# Patient Record
Sex: Female | Born: 1975
Health system: Southern US, Community
[De-identification: ages and names within clinical notes are randomized; demographics above are authoritative.]

## PROBLEM LIST (undated history)

## (undated) DIAGNOSIS — K259 Gastric ulcer, unspecified as acute or chronic, without hemorrhage or perforation: Secondary | ICD-10-CM

## (undated) DIAGNOSIS — R740 Nonspecific elevation of levels of transaminase and lactic acid dehydrogenase [LDH]: Secondary | ICD-10-CM

## (undated) DIAGNOSIS — F411 Generalized anxiety disorder: Secondary | ICD-10-CM

## (undated) DIAGNOSIS — F17200 Nicotine dependence, unspecified, uncomplicated: Secondary | ICD-10-CM

## (undated) DIAGNOSIS — M419 Scoliosis, unspecified: Secondary | ICD-10-CM

## (undated) DIAGNOSIS — E042 Nontoxic multinodular goiter: Secondary | ICD-10-CM

## (undated) DIAGNOSIS — R51 Headache: Secondary | ICD-10-CM

## (undated) DIAGNOSIS — N926 Irregular menstruation, unspecified: Secondary | ICD-10-CM

## (undated) DIAGNOSIS — K219 Gastro-esophageal reflux disease without esophagitis: Secondary | ICD-10-CM

## (undated) DIAGNOSIS — E079 Disorder of thyroid, unspecified: Secondary | ICD-10-CM

## (undated) DIAGNOSIS — G43909 Migraine, unspecified, not intractable, without status migrainosus: Secondary | ICD-10-CM

## (undated) HISTORY — DX: Nonspecific elevation of levels of transaminase and lactic acid dehydrogenase (ldh): R74.0

## (undated) HISTORY — DX: Migraine, unspecified, not intractable, without status migrainosus: G43.909

## (undated) HISTORY — DX: Disorder of thyroid, unspecified: E07.9

## (undated) HISTORY — DX: Nontoxic multinodular goiter: E04.2

## (undated) HISTORY — PX: UPPER GASTROINTESTINAL ENDOSCOPY: SHX188

## (undated) HISTORY — PX: TYMPANOPLASTY: SHX33

## (undated) HISTORY — DX: Irregular menstruation, unspecified: N92.6

## (undated) HISTORY — DX: Nicotine dependence, unspecified, uncomplicated: F17.200

## (undated) HISTORY — DX: Generalized anxiety disorder: F41.1

## (undated) HISTORY — DX: Scoliosis, unspecified: M41.9

## (undated) HISTORY — PX: TONSILLECTOMY: SUR1361

## (undated) HISTORY — PX: TYMPANOSTOMY TUBE PLACEMENT: SHX32

## (undated) HISTORY — DX: Gastro-esophageal reflux disease without esophagitis: K21.9

## (undated) HISTORY — DX: Headache: R51

## (undated) HISTORY — DX: Gastric ulcer, unspecified as acute or chronic, without hemorrhage or perforation: K25.9

---

## 2000-09-03 ENCOUNTER — Other Ambulatory Visit: Admission: RE | Admit: 2000-09-03 | Discharge: 2000-09-03 | Payer: Self-pay | Admitting: Internal Medicine

## 2002-06-03 ENCOUNTER — Other Ambulatory Visit: Admission: RE | Admit: 2002-06-03 | Discharge: 2002-06-03 | Payer: Self-pay | Admitting: Internal Medicine

## 2006-05-01 ENCOUNTER — Other Ambulatory Visit: Admission: RE | Admit: 2006-05-01 | Discharge: 2006-05-01 | Payer: Self-pay | Admitting: Internal Medicine

## 2006-05-01 ENCOUNTER — Encounter: Payer: Self-pay | Admitting: Internal Medicine

## 2006-05-01 ENCOUNTER — Ambulatory Visit: Payer: Self-pay | Admitting: Internal Medicine

## 2006-05-01 LAB — CONVERTED CEMR LAB

## 2007-06-17 ENCOUNTER — Encounter: Payer: Self-pay | Admitting: Internal Medicine

## 2007-06-17 DIAGNOSIS — R51 Headache: Secondary | ICD-10-CM | POA: Insufficient documentation

## 2007-06-17 DIAGNOSIS — R519 Headache, unspecified: Secondary | ICD-10-CM | POA: Insufficient documentation

## 2007-06-17 DIAGNOSIS — N926 Irregular menstruation, unspecified: Secondary | ICD-10-CM | POA: Insufficient documentation

## 2007-06-17 HISTORY — DX: Irregular menstruation, unspecified: N92.6

## 2007-06-17 HISTORY — DX: Headache: R51

## 2007-07-23 ENCOUNTER — Other Ambulatory Visit: Admission: RE | Admit: 2007-07-23 | Discharge: 2007-07-23 | Payer: Self-pay | Admitting: Internal Medicine

## 2007-07-23 ENCOUNTER — Encounter: Payer: Self-pay | Admitting: Internal Medicine

## 2007-07-23 ENCOUNTER — Ambulatory Visit: Payer: Self-pay | Admitting: Internal Medicine

## 2007-07-26 LAB — CONVERTED CEMR LAB
AST: 18 units/L (ref 0–37)
Albumin: 4.4 g/dL (ref 3.5–5.2)
Basophils Absolute: 0 10*3/uL (ref 0.0–0.1)
Bilirubin, Direct: 0.1 mg/dL (ref 0.0–0.3)
CO2: 31 meq/L (ref 19–32)
Chloride: 105 meq/L (ref 96–112)
Eosinophils Relative: 1.4 % (ref 0.0–5.0)
GFR calc non Af Amer: 103 mL/min
Glucose, Bld: 69 mg/dL — ABNORMAL LOW (ref 70–99)
HCT: 38.7 % (ref 36.0–46.0)
LDL Cholesterol: 102 mg/dL — ABNORMAL HIGH (ref 0–99)
MCV: 91.7 fL (ref 78.0–100.0)
Neutro Abs: 2.3 10*3/uL (ref 1.4–7.7)
RBC: 4.22 M/uL (ref 3.87–5.11)
RDW: 12.4 % (ref 11.5–14.6)
Sodium: 141 meq/L (ref 135–145)
Total Bilirubin: 0.9 mg/dL (ref 0.3–1.2)
Total CHOL/HDL Ratio: 2.5
Total Protein: 7 g/dL (ref 6.0–8.3)
VLDL: 12 mg/dL (ref 0–40)

## 2008-01-10 ENCOUNTER — Ambulatory Visit: Payer: Self-pay | Admitting: Internal Medicine

## 2008-01-10 DIAGNOSIS — F17200 Nicotine dependence, unspecified, uncomplicated: Secondary | ICD-10-CM

## 2008-01-10 HISTORY — DX: Nicotine dependence, unspecified, uncomplicated: F17.200

## 2008-12-04 ENCOUNTER — Ambulatory Visit: Payer: Self-pay | Admitting: Internal Medicine

## 2009-01-10 ENCOUNTER — Other Ambulatory Visit: Admission: RE | Admit: 2009-01-10 | Discharge: 2009-01-10 | Payer: Self-pay | Admitting: Internal Medicine

## 2009-01-10 ENCOUNTER — Encounter: Payer: Self-pay | Admitting: Internal Medicine

## 2009-01-10 ENCOUNTER — Ambulatory Visit: Payer: Self-pay | Admitting: Internal Medicine

## 2009-01-10 DIAGNOSIS — R7401 Elevation of levels of liver transaminase levels: Secondary | ICD-10-CM | POA: Insufficient documentation

## 2009-01-10 DIAGNOSIS — R740 Nonspecific elevation of levels of transaminase and lactic acid dehydrogenase [LDH]: Secondary | ICD-10-CM

## 2009-01-10 DIAGNOSIS — R7402 Elevation of levels of lactic acid dehydrogenase (LDH): Secondary | ICD-10-CM | POA: Insufficient documentation

## 2009-01-10 DIAGNOSIS — E079 Disorder of thyroid, unspecified: Secondary | ICD-10-CM

## 2009-01-10 HISTORY — DX: Elevation of levels of liver transaminase levels: R74.01

## 2009-01-10 HISTORY — DX: Disorder of thyroid, unspecified: E07.9

## 2009-01-11 LAB — CONVERTED CEMR LAB
AST: 17 units/L (ref 0–37)
T3, Free: 2.9 pg/mL (ref 2.3–4.2)

## 2009-02-08 ENCOUNTER — Encounter: Payer: Self-pay | Admitting: Internal Medicine

## 2009-02-21 ENCOUNTER — Telehealth: Payer: Self-pay | Admitting: Internal Medicine

## 2009-03-12 ENCOUNTER — Ambulatory Visit: Payer: Self-pay | Admitting: Endocrinology

## 2009-04-17 ENCOUNTER — Encounter: Admission: RE | Admit: 2009-04-17 | Discharge: 2009-04-17 | Payer: Self-pay | Admitting: Endocrinology

## 2009-04-18 DIAGNOSIS — E042 Nontoxic multinodular goiter: Secondary | ICD-10-CM

## 2009-04-18 HISTORY — DX: Nontoxic multinodular goiter: E04.2

## 2009-04-24 ENCOUNTER — Telehealth (INDEPENDENT_AMBULATORY_CARE_PROVIDER_SITE_OTHER): Payer: Self-pay | Admitting: *Deleted

## 2010-01-04 ENCOUNTER — Ambulatory Visit: Payer: Self-pay | Admitting: Endocrinology

## 2010-01-30 ENCOUNTER — Ambulatory Visit: Payer: Self-pay | Admitting: Internal Medicine

## 2010-01-30 ENCOUNTER — Other Ambulatory Visit: Admission: RE | Admit: 2010-01-30 | Discharge: 2010-01-30 | Payer: Self-pay | Admitting: Internal Medicine

## 2010-01-30 DIAGNOSIS — F411 Generalized anxiety disorder: Secondary | ICD-10-CM

## 2010-01-30 HISTORY — DX: Generalized anxiety disorder: F41.1

## 2010-01-30 LAB — CONVERTED CEMR LAB
Bilirubin Urine: NEGATIVE
Blood in Urine, dipstick: NEGATIVE
Glucose, Urine, Semiquant: NEGATIVE
Specific Gravity, Urine: >=1.03
WBC Urine, dipstick: NEGATIVE

## 2010-01-30 LAB — HM PAP SMEAR

## 2010-02-04 LAB — CONVERTED CEMR LAB
BUN: 8 mg/dL (ref 6–23)
Basophils Absolute: 0 10*3/uL (ref 0.0–0.1)
Bilirubin, Direct: 0.2 mg/dL (ref 0.0–0.3)
CO2: 28 meq/L (ref 19–32)
Calcium: 9.5 mg/dL (ref 8.4–10.5)
Chloride: 107 meq/L (ref 96–112)
Cholesterol: 169 mg/dL (ref 0–200)
Creatinine, Ser: 0.7 mg/dL (ref 0.4–1.2)
Eosinophils Absolute: 0 10*3/uL (ref 0.0–0.7)
Glucose, Bld: 73 mg/dL (ref 70–99)
HCT: 39.4 % (ref 36.0–46.0)
LDL Cholesterol: 91 mg/dL (ref 0–99)
Lymphocytes Relative: 34.9 % (ref 12.0–46.0)
MCHC: 33.6 g/dL (ref 30.0–36.0)
MCV: 97 fL (ref 78.0–100.0)
Monocytes Absolute: 0.4 10*3/uL (ref 0.1–1.0)
Monocytes Relative: 9.3 % (ref 3.0–12.0)
RBC: 4.06 M/uL (ref 3.87–5.11)
RDW: 12.2 % (ref 11.5–14.6)
T3, Free: 2.7 pg/mL (ref 2.3–4.2)
TSH: 0.42 microintl units/mL (ref 0.35–5.50)
Total Protein: 7.3 g/dL (ref 6.0–8.3)
Triglycerides: 23 mg/dL (ref 0.0–149.0)
WBC: 4.5 10*3/uL (ref 4.5–10.5)

## 2010-03-04 ENCOUNTER — Encounter: Payer: Self-pay | Admitting: Internal Medicine

## 2010-05-01 ENCOUNTER — Ambulatory Visit: Payer: Self-pay | Admitting: Endocrinology

## 2010-05-07 ENCOUNTER — Encounter: Admission: RE | Admit: 2010-05-07 | Discharge: 2010-05-07 | Payer: Self-pay | Admitting: Endocrinology

## 2011-01-19 ENCOUNTER — Encounter: Payer: Self-pay | Admitting: Obstetrics and Gynecology

## 2011-01-25 ENCOUNTER — Encounter: Payer: Self-pay | Admitting: Internal Medicine

## 2011-01-26 LAB — CONVERTED CEMR LAB
ALT: 45 units/L — ABNORMAL HIGH (ref 0–35)
AST: 31 units/L (ref 0–37)
Alkaline Phosphatase: 64 units/L (ref 39–117)
CO2: 29 meq/L (ref 19–32)
Cholesterol: 175 mg/dL (ref 0–200)
Creatinine, Ser: 0.6 mg/dL (ref 0.4–1.2)
Eosinophils Relative: 2.2 % (ref 0.0–5.0)
GFR calc Af Amer: 148 mL/min
GFR calc non Af Amer: 122 mL/min
HCT: 38.8 % (ref 36.0–46.0)
HDL: 66.8 mg/dL (ref >39.0)
Lymphocytes Relative: 41.8 % (ref 12.0–46.0)
MCHC: 34.8 g/dL (ref 30.0–36.0)
MCV: 92.1 fL (ref 78.0–100.0)
Monocytes Absolute: 0.3 10*3/uL (ref 0.1–1.0)
Neutro Abs: 1.8 10*3/uL (ref 1.4–7.7)
Potassium: 4.1 meq/L (ref 3.5–5.1)
Sodium: 139 meq/L (ref 135–145)
TSH: 0.97 microintl units/mL (ref 0.35–5.50)
Total CHOL/HDL Ratio: 2.6
Total Protein: 7.4 g/dL (ref 6.0–8.3)
Triglycerides: 56 mg/dL (ref 0–149)
WBC: 3.8 10*3/uL — ABNORMAL LOW (ref 4.5–10.5)

## 2011-01-28 NOTE — Letter (Signed)
Summary: [facility]  [facility]   Imported By: Maryln Gottron 03/13/2010 12:49:16  _____________________________________________________________________  External Attachment:    Type:   Image     Comment:   External Document

## 2011-01-28 NOTE — Assessment & Plan Note (Signed)
Summary: CPX/PAP/PT WILL COME IN FASTING/NJR   Vital Signs:  Patient profile:   35 year old female Menstrual status:  irregular LMP:     01/02/2010 Height:      64.5 inches Weight:      136 pounds BMI:     23.07 Pulse rate:   66 / minute BP sitting:   100 / 60  (right arm) Cuff size:   regular  Vitals Entered By: Romualdo Bolk, CMA (AAMA) (January 30, 2010 10:22 AM) CC: CPX with pap- Pt is having her periods every 15-20 days, Pt would like MRI/CT of head to make sure nothing is wrong of ha since she is having terrible ha's.  Pt would also like a full body scan to make sure everything is okay with body. Pt is aware that Ins may not pay for this. Pt also needs something for nerves. She is still anxious and needs something to calm her down short term. She states that her emotions take over her at times.  LMP (date): 01/02/2010 LMP - Character: heavy Menarche (age onset years): 10   Menses interval (days): 15-20 Menstrual flow (days): 7-10 Menstrual Status irregular Enter LMP: 01/02/2010 Last PAP Result NEGATIVE FOR INTRAEPITHELIAL LESIONS OR MALIGNANCY.   History of Present Illness: Tell patient that come in today for   for preventive visit and pap. She is now married . She has concerns about a number of ongoing problems and asks opinion. HAs: hasnt seen dr lewit in almost a year. topamax didnt help but did  decrease meds and takes excedrin at most 2 x per week. limiting meds.   Fertility: was supposed to get ? [facility]  but dindt do this yet because of family issues and life changes .  Anxiety:  now that other things are calm needs advice about htis.  had seen counseler and med at Dr Walker Shadow office.  over a year ago. sometimes has sleep issue with mind racing and worry.    Asks about imaging to r/o any problems such as head imaging etc .  Goiter  : followed by dr Everardo All. has follow up soon . no known change.   Preventive Screening-Counseling &  Management  Alcohol-Tobacco     Alcohol drinks/day: <1     Alcohol type: wine     Smoking Status: quit     Year Quit: 05-30-07 Caffeine-Diet-Exercise     Caffeine use/day: 2 small cups     Does Patient Exercise: no  Hep-HIV-STD-Contraception     05/30/2023 Exposure-Excessive: no  Safety-Violence-Falls     Seat Belt Use: yes     Smoke Detectors: yes  Current Medications (verified): 1)  None  Allergies (verified): No Known Drug Allergies  Past History:  Past medical, surgical, family and social histories (including risk factors) reviewed, and no changes noted (except as noted below).  Past Medical History: Headaches anxiety Irreg menses  infertility evaluation Goiter       LAST Mammogram: n/a Pap: 2007-05-30 Td: unsure Colonscopy: n/a EKG: 05-30-2003 Smoking: former  Consults None   Past Surgical History: Reviewed history from 01/10/2009 and no changes required. Denies surgical history PE tubes     Past History:  Care Management: [facility]rology: [facility]- it the past Gynecology: Cherly Hensen- in the past Endo: [facility]  Family History: Reviewed history from 03/12/2009 and no changes required. [place] Father: died from pneumonia  29-May-2005 , alcoholic.  no dm or ht     Mother:  05-30-03  surgical error cholec   doing better .   Siblings: older bro good., younger  sister no thyroid dz  Social History: HH of 2      pet dog some exercise.   Sleep  ok   38 hours or more per week.   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   Physical Exam General Appearance: well developed, well nourished, no acute distress Eyes: conjunctiva and lids normal, PERRLA, EOMI, WNL no lid lag  Ears, Nose, Mouth, Throat: TM clear, nares clear, oral exam WNL Neck: supple, no lymphadenopathy, , no JVD  thyroid palpable  non tender  Respiratory: clear to auscultation and percussion, respiratory effort normal Cardiovascular: regular rate and rhythm, S1-S2, no murmur, rub or gallop, no bruits, peripheral pulses normal and symmetric, no cyanosis, clubbing, edema or varicosities Chest: no scars, masses, tenderness; no asymmetry, skin changes, nipple discharge   Gastrointestinal: soft, non-tender; no hepatosplenomegaly, masses; active bowel sounds all quadrants,   Genitourinary: no vaginal discharge, lesions; no masses or tenderness PAP done  Lymphatic: no cervical, axillary or inguinal adenopathy Musculoskeletal: gait normal, muscle tone and strength WNL, no joint swelling, effusions, discoloration, crepitus  Skin: clear, good turgor, color WNL, no rashes, lesions, or ulcerations Neurologic: normal mental status, normal reflexes, normal strength, sensation, and motion Psychiatric: alert; oriented to person, place and time Other Exam:     Impression & Recommendations:  Problem # 1:  HEALTH MAINTENANCE EXAM, ADULT (ICD-V70.0) disc healthy lifestyle .  MRI testing for most   things not helpful for screening. rec  see HA eval for advisability of  imaging of head.  Orders: TLB-Lipid Panel (80061-LIPID) TLB-BMP (Basic Metabolic Panel-BMET) (80048-METABOL) TLB-CBC Platelet - w/Differential (85025-CBCD) TLB-Hepatic/Liver Function Pnl (80076-HEPATIC) TLB-TSH (Thyroid Stimulating Hormone) (84443-TSH) TLB-Prolactin (84146-PROL) T-HIV Antibody  (Reflex) [id number]) T-RPR (Syphilis) (09811-91478)  Problem # 2:  ROUTINE GYNECOLOGICAL EXAM  (ICD-V72.31) PAP done  Orders: T-HIV Antibody  (Reflex) ([id number]) T-RPR (Syphilis) ([id number]) Pap Smear, Thin Prep ( Collection of) (M8413)  Problem # 3:  GOITER, MULTINODULAR (ICD-241.1)  Orders: TLB-T4 (Thyrox), Free (84439-FT4R) TLB-T3, Free (Triiodothyronine) (84481-T3FREE)  Problem # 4:  IRREGULAR MENSTRUATION (ICD-626.4) Assessment: Comment Only  Problem # 5:  HEADACHE (ICD-784.0) calendar ha and reviewed    back to dr lewit for now   minimize meds Orders: TLB-Prolactin (84146-PROL)  Problem # 6:  anxiety  disc    rec counsleing if needed.   Patient Instructions: 1)  congrats on tobacco free. 2)  Calendar HA and meds again and get back with Dr Clarisse Gouge .   3)  See Dr Rod Mae all about the anxiety issues again. 4)  Continue to exercise and eat healthy.  5)  Kee p appt with Dr  Everardo All. 6)  You will be informed of lab results when available.  in addition to PV counseling   .    additional counseling about her HA , anxiety, and goiter .    15 minutes .   Laboratory Results   Urine Tests    Routine Urinalysis   Color: yellow Appearance: Clear Glucose: negative   (  Normal Range: Negative) Bilirubin: negative   (Normal Range: Negative) Ketone: trace (5)   (Normal Range: Negative) Spec. Gravity: >=1.030   (Normal Range: 1.003-1.035) Blood: negative   (Normal Range: Negative) pH: 5.5   (Normal Range: 5.0-8.0) Protein: trace   (Normal Range: Negative) Urobilinogen: 0.2   (Normal Range: 0-1) Nitrite: negative   (Normal Range: Negative) Leukocyte Esterace: negative   (Normal Range: Negative)    Comments: Rita Ohara  January 30, 2010 2:37 PM

## 2011-01-28 NOTE — Assessment & Plan Note (Signed)
Summary: follow up-lb   Vital Signs:  Patient profile:   35 year old female Menstrual status:  irregular Height:      64 inches Weight:      136.50 pounds O2 Sat:      99 % on Room air Temp:     98.1 degrees F oral Pulse rate:   78 / minute BP sitting:   110 / 70  (left arm) Cuff size:   regular  Vitals Entered By: Margaret Pyle, CMA (May 01, 2010 10:44 AM)  O2 Flow:  Room air CC: Follow up/DBD   Referring Provider:  Dr. Fabian Sharp Primary Provider:  Madelin Headings MD  CC:  Follow up/DBD.  History of Present Illness: pt says she does not notice the goiter, except that the right side of the anterior neck is slightly larger than the left.  neck is painless.  Current Medications (verified): 1)  Hydrocodone .... As Needed For Migraine Headache  Allergies (verified): No Known Drug Allergies  Past History:  Past Medical History: Last updated: 01/30/2010 Headaches anxiety Irreg menses  infertility evaluation Goiter       LAST Mammogram: n/a Pap: 2008 Td: unsure Colonscopy: n/a EKG: 2004 Smoking: former  Consults None   Review of Systems       she has lost a few lbs, due to recent gi illness.  Physical Exam  General:  normal appearance.   Neck:  thyroid is 2x normal size.  no discrete nodule, but right lobe is larger than left Additional Exam:  test results are reviewed: tsh continues to be low-normal   Impression & Recommendations:  Problem # 1:  GOITER, MULTINODULAR (ICD-241.1)  FT4: 0.6 (01/30/2010)   TSH: 0.42 (01/30/2010)     Medications Added to Medication List This Visit: 1)  Hydrocodone  .... As needed for migraine headache  Other Orders: Est. Patient Level III (60454) Radiology Referral (Radiology)  Patient Instructions: 1)  cc dr cousins and le[facility]itt. 2)  recheck thyroid ultrasound.  you will be called with a day and time for an appointment. 3)  return 1 year. 4)  most of the time, a "lumpy thyroid" will eventually become  overactive.  this is usually a slow process, happening over the span of many years.

## 2011-02-05 ENCOUNTER — Other Ambulatory Visit (INDEPENDENT_AMBULATORY_CARE_PROVIDER_SITE_OTHER): Payer: BC Managed Care – PPO | Admitting: Internal Medicine

## 2011-02-05 DIAGNOSIS — Z0000 Encounter for general adult medical examination without abnormal findings: Secondary | ICD-10-CM

## 2011-02-05 LAB — CBC WITH DIFFERENTIAL/PLATELET
Basophils Absolute: 0 10*3/uL (ref 0.0–0.1)
Basophils Relative: 0.4 % (ref 0.0–3.0)
Eosinophils Absolute: 0.1 10*3/uL (ref 0.0–0.7)
HCT: 39.1 % (ref 36.0–46.0)
Lymphocytes Relative: 37.6 % (ref 12.0–46.0)
MCV: 93.6 fl (ref 78.0–100.0)
Monocytes Absolute: 0.5 10*3/uL (ref 0.1–1.0)
Monocytes Relative: 10.9 % (ref 3.0–12.0)
Neutrophils Relative %: 49.1 % (ref 43.0–77.0)
RDW: 13.5 % (ref 11.5–14.6)
WBC: 4.4 10*3/uL — ABNORMAL LOW (ref 4.5–10.5)

## 2011-02-05 LAB — LIPID PANEL
HDL: 71.8 mg/dL (ref >39.00)
LDL Cholesterol: 103 mg/dL — ABNORMAL HIGH (ref 0–99)

## 2011-02-05 LAB — BASIC METABOLIC PANEL
BUN: 8 mg/dL (ref 6–23)
CO2: 29 mEq/L (ref 19–32)
Calcium: 9.3 mg/dL (ref 8.4–10.5)
Chloride: 104 mEq/L (ref 96–112)
Creatinine, Ser: 0.7 mg/dL (ref 0.4–1.2)
Glucose, Bld: 73 mg/dL (ref 70–99)
Sodium: 138 mEq/L (ref 135–145)

## 2011-02-05 LAB — HEPATIC FUNCTION PANEL
Albumin: 4.5 g/dL (ref 3.5–5.2)
Bilirubin, Direct: 0.1 mg/dL (ref 0.0–0.3)

## 2011-02-12 ENCOUNTER — Ambulatory Visit (INDEPENDENT_AMBULATORY_CARE_PROVIDER_SITE_OTHER): Payer: BC Managed Care – PPO | Admitting: Internal Medicine

## 2011-02-12 ENCOUNTER — Encounter: Payer: Self-pay | Admitting: Internal Medicine

## 2011-02-12 ENCOUNTER — Other Ambulatory Visit (HOSPITAL_COMMUNITY)
Admission: RE | Admit: 2011-02-12 | Discharge: 2011-02-12 | Disposition: A | Discharge status: Home / Self Care | Payer: BC Managed Care – PPO | Source: Ambulatory Visit | Attending: Internal Medicine | Admitting: Internal Medicine

## 2011-02-12 DIAGNOSIS — Z0000 Encounter for general adult medical examination without abnormal findings: Secondary | ICD-10-CM | POA: Insufficient documentation

## 2011-02-12 DIAGNOSIS — R51 Headache: Secondary | ICD-10-CM

## 2011-02-12 DIAGNOSIS — Z01419 Encounter for gynecological examination (general) (routine) without abnormal findings: Secondary | ICD-10-CM | POA: Insufficient documentation

## 2011-02-12 DIAGNOSIS — E042 Nontoxic multinodular goiter: Secondary | ICD-10-CM

## 2011-02-12 DIAGNOSIS — Z124 Encounter for screening for malignant neoplasm of cervix: Secondary | ICD-10-CM | POA: Insufficient documentation

## 2011-02-12 DIAGNOSIS — G4700 Insomnia, unspecified: Secondary | ICD-10-CM | POA: Insufficient documentation

## 2011-02-12 MED ORDER — ZOLPIDEM TARTRATE 10 MG PO TABS: 10.0000 mg | ORAL_TABLET | Freq: Every evening | ORAL | Status: AC | PRN

## 2011-02-13 NOTE — Progress Notes (Signed)
  Subjective:    Patient ID: Crystal Mcintyre, female    DOB: 1976-06-14, 35 y.o.   MRN: [id number]  HPI  this patient comes in for a preventative exam and gynecology logic exam today. Since her last visit she has had no major changes in her health and is actually doing much better. There is less stress at home although work is still stressful. She continues to remain tobacco free. She still has her goiter but is not on any medication for this.   She sees Dr. Peggye Form for her headaches and they are improved with decreased stress. She avoids alcohol and only has caffeine in the morning. She still has problems sleeping and asks about a sleep aid. She has tried Tylenol PM with minimal success. Her schedule is erratic with work during the day and school sometimes late into the night.  she only takes will plan and hydrocodone as needed for her headaches which is not very frequent.  He is planning to undergo hCG retrieval infertility treatment soon. Her gynecologist is Dr. Cherly Hensen.   Review of Systems  negative for vision and hearing problem chest pain shortness of breath breast lumps GI GU problems except as above. Headaches as above. No bleeding bruising new joint swelling.  Rest of ros non contributory.    Objective:   Physical Exam Physical Exam: Vital signs reviewed WUJ:WJXB is a well-developed well-nourished alert cooperative  white female who appears her stated age in no acute distress.  HEENT: normocephalic  traumatic , Eyes: PERRL EOM's full, conjunctiva clear, Nares: paten,t no deformity discharge or tenderness., Ears: no deformity EAC's clear TMs with normal landmarks. Mouth: clear OP, no lesions, edema.  Moist mucous membranes. Dentition in adequate repair. NECK: supple without masses,  or bruits. Thyroid easily palpable and non tender  CHEST/PULM:  Clear to auscultation and percussion breath sounds equal no wheeze , rales or rhonchi. No chest wall deformities or tenderness.  Breast exam  without masses  Of discharge . Axilla is clear  CV: PMI is nondisplaced, S1 S2 no gallops, murmurs, rubs. Peripheral pulses are full without delay.No JVD .  ABDOMEN: Bowel sounds normal nontender  No guard or rebound, no hepato splenomegal no CVA tenderness.  No hernia. Extremtities:  No clubbing cyanosis or edema, no acute joint swelling or redness no focal atrophy NEURO:  Oriented x3, cranial nerves 3-12 appear to be intact, no obvious focal weakness,gait within normal limits no abnormal reflexes or asymmetrical SKIN: No acute rashes normal turgor, color, no bruising or petechiae. PSYCH: Oriented, good eye contact, no obvious depression anxiety, cognition and judgment appear normal.  LN no adenopathy  Cervical axillary and  Inguinal  Pelvic: NL ext GU, labia clear without lesions or rash . Vagina no lesions .Cervix: clear  UTERUS: Neg CMT Adnexa:  clear no masses . PAP done       Assessment & Plan:   preventive visit Pap smear done. Labs reviewed. Up to date. Counseled regarding healthy nutrition, exercise, sleep, injury prevention, calcium vit d and healthy weight .   Sleep disturbance   It has been problematic and she has paid attention to his sleep hygiene in stress reduction. It is reasonable to try another sleep aid with discussion  Risk-benefit because it is habit forming. We'll give her a prescription today and observe.  headaches under care by Dr.  Clarisse Gouge   Stable at present.  multinodular goiter  Sees. Dr. Everardo All reviewed last ultrasound.  Fertility treatment.

## 2011-02-13 NOTE — Assessment & Plan Note (Signed)
Sleep disturbance   It has been problematic and she has paid attention to his sleep hygiene in stress reduction. It is reasonable to try another sleep aid with discussion  Risk-benefit because it is habit forming. We'll give her a prescription today and observe.

## 2011-05-13 NOTE — Assessment & Plan Note (Signed)
[facility]                            [facility] OFFICE NOTE   BRAZIL, VOYTKO                     MRN:          [id number]  DATE:07/23/2007                            DOB:          06/09/1976    CHIEF COMPLAINT:  Here for a checkup.   The patient has had a past history of stress with significant family  illnesses.  She has seen Dr. Loralie Champagne office in this regard, and had  been on a number of medications for this.  However, is only on Lunesta  HS at present.  She is considering conception, and has questions about  this.  Her periods are regular, although she does have occasional hot  flashes.   PAST MEDICAL HISTORY:  See chart.  Headaches and irregular periods.   SOCIAL HISTORY:  She is married, does smoke tobacco, just when she comes  home from work; and occasional alcohol.  She tries to do regular  exercise.   FAMILY HISTORY:  No significant change.   OBJECTIVE:  Height 64.5, weight 140, pulse 78 and regular.  Blood  pressure 100/60.  She is a well developed, well nourished, healthy appearing African-  American female in no acute distress.  HEENT:  Normocephalic.  TMs are clear.  Eyes:  PERL.  Sclerae are not  icteric.  OP is clear.  Teeth are in good repair.  NECK:  Supple without masses, thyromegaly or bruit.  CHEST:  CTA.  BS equal.  Cardiac S1 and S2, no gallops or murmur.  Peripheral pulses are present without delay.  Negative CCE.  BREASTS:  No nodules or discharge.  Axillae are clear.  ABDOMEN:  Soft, without organomegaly, guarding, or rebound.  External GU,  normal.  Cervix and os clear blood in vaginal_ vault.  Bimanual, negative CMT.  Adnexa, clear.Marland Kitchen   IMPRESSION:  Wellness exam, Pap smear, well woman exam.  History of  headaches, quiescence.  Tobacco.  I have recommended she discontinue.  __________ counseling, did discuss tobacco, alcohol, , folic acid,  supplements.  In regard to her sleep, she should discuss with  Dr.  Nolen Mu, as she should be getting off her Lunesta before pregnancy.  We  will do CPX labs today, and notify her of results.     Neta Mends. Panosh, MD  Electronically Signed    WKP/MedQ  DD: 07/23/2007  DT: 07/24/2007  Job #: [id number]

## 2011-09-24 ENCOUNTER — Encounter: Payer: Self-pay | Admitting: *Deleted

## 2011-09-30 ENCOUNTER — Encounter: Payer: Self-pay | Admitting: Endocrinology

## 2011-09-30 ENCOUNTER — Ambulatory Visit (INDEPENDENT_AMBULATORY_CARE_PROVIDER_SITE_OTHER): Payer: BC Managed Care – PPO | Admitting: Endocrinology

## 2011-09-30 DIAGNOSIS — E042 Nontoxic multinodular goiter: Secondary | ICD-10-CM

## 2011-09-30 NOTE — Patient Instructions (Signed)
You have a small "lumpy thyroid," which is usually hereditary.  most of the time, a "lumpy thyroid" will eventually become overactive.  this is usually a slow process, happening over the span of many years. Please return in 1 year.

## 2011-09-30 NOTE — Progress Notes (Signed)
  Subjective:    Patient ID: Crystal Mcintyre, female    DOB: 06-14-1976, 35 y.o.   MRN: [id number]  HPI    Review of Systems     Objective:   Physical Exam        Assessment & Plan:  Multinodular goiter, which is usually hereditary.  Not big enough to need bx now

## 2011-09-30 NOTE — Progress Notes (Signed)
  Subjective:    Patient ID: Crystal Mcintyre, female    DOB: 11-16-1976, 35 y.o.   MRN: [id number]  HPI Pt returns for f/u of amsll multinodular goiter, first found in 2010.  Nodules were not big enough to need bx.  pt states she feels well in general, except for fatigue.  She does not notice the goiter.     Review of Systems She has a few lbs of weight gain    Objective:   Physical Exam VITAL SIGNS:  See vs page GENERAL: no distress Neck: thyroid is slightly enlarged, right > left.  i do not appreciate any nodule.        Assessment & Plan:

## 2012-01-01 ENCOUNTER — Ambulatory Visit: Payer: BC Managed Care – PPO | Admitting: Endocrinology

## 2012-01-12 ENCOUNTER — Encounter: Payer: Self-pay | Admitting: Endocrinology

## 2012-01-12 ENCOUNTER — Ambulatory Visit (INDEPENDENT_AMBULATORY_CARE_PROVIDER_SITE_OTHER): Payer: BC Managed Care – PPO | Admitting: Endocrinology

## 2012-01-12 VITALS — BP 122/86 | HR 77 | Temp 97.4°F | Ht 64.5 in | Wt 173.0 lb

## 2012-01-12 DIAGNOSIS — E042 Nontoxic multinodular goiter: Secondary | ICD-10-CM

## 2012-01-12 NOTE — Progress Notes (Signed)
  Subjective:    Patient ID: Crystal Mcintyre, female    DOB: 1976-07-31, 36 y.o.   MRN: [id number]  HPI Pt returns for f/u of amsll multinodular goiter, first found in 2010. Nodules were not big enough to need bx. pt states she feels well in general, except for fatigue and weight gain.  She also has menses lasting longer than usual. She does not notice the goiter.  She would like to start a pregnancy soon.   Past Medical History  Diagnosis Date  . GOITER, MULTINODULAR 04/18/2009  . THYROID STIMULATING HORMONE, ABNORMAL 01/10/2009  . Other anxiety states 01/30/2010  . TOBACCO USE 01/10/2008  . IRREGULAR MENSTRUATION 06/17/2007  . Headache 06/17/2007  . TRANSAMINASES, SERUM, ELEVATED 01/10/2009    Past Surgical History  Procedure Date  . Tympanostomy tube placement     History   Social History  . Marital Status: Married    Spouse Name: N/A    Number of Children: N/A  . Years of Education: N/A   Occupational History  . 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 Smoking status: Former Smoker    Quit date: 12/29/2008  . Smokeless tobacco: Not on file  . Alcohol Use: Yes     occasional  . Drug Use: No  . Sexually Active: Yes -- Female partner(s)   Other Topics Concern  . Not on file   Social History Narrative   HH of 2Pet dog    No current outpatient prescriptions on file prior to visit.    No Known Allergies  Family History  Problem Relation Age of Onset  . Pneumonia Father   . Alcohol abuse Father     BP 122/86  Pulse 77  Temp(Src) 97.4 F (36.3 C) (Oral)  Ht 5' 4.5" (1.638 m)  Wt 173 lb (78.472 kg)  BMI 29.24 kg/m2  SpO2 99%  LMP 01/10/2012   Review of Systems She also has headache.      Objective:   Physical Exam VITAL SIGNS:  See vs page GENERAL: no distress Neck: thyroid is 2x normal size on the right lobe, but normal on the left. i do not appreciate any nodule.    Lab Results  Component Value Date   TSH 0.90  02/05/2011      Assessment & Plan:  Multinodular goiter.  euthyroid

## 2012-01-12 NOTE — Patient Instructions (Addendum)
blood tests are being requested for you today.  please call [phone] to hear your test results.  You will be prompted to enter the 9-digit "MRN" number that appears at the top left of this page, followed by #.  Then you will hear the message. You have a small "lumpy thyroid," which is usually hereditary.  most of the time, a "lumpy thyroid" will eventually become overactive.  this is usually a slow process, happening over the span of many years. Please return in 1 year.

## 2012-01-13 ENCOUNTER — Encounter: Payer: BC Managed Care – PPO | Admitting: Internal Medicine

## 2012-01-21 ENCOUNTER — Other Ambulatory Visit (INDEPENDENT_AMBULATORY_CARE_PROVIDER_SITE_OTHER): Payer: BC Managed Care – PPO

## 2012-01-21 DIAGNOSIS — E042 Nontoxic multinodular goiter: Secondary | ICD-10-CM

## 2012-01-21 DIAGNOSIS — Z0000 Encounter for general adult medical examination without abnormal findings: Secondary | ICD-10-CM

## 2012-01-21 LAB — POCT URINALYSIS DIPSTICK
Bilirubin, UA: NEGATIVE
Blood, UA: NEGATIVE
Leukocytes, UA: NEGATIVE
Nitrite, UA: NEGATIVE
Spec Grav, UA: >=1.03
Urobilinogen, UA: 0.2
pH, UA: 5

## 2012-01-21 LAB — CBC WITH DIFFERENTIAL/PLATELET
Basophils Absolute: 0 10*3/uL (ref 0.0–0.1)
Basophils Relative: 0.5 % (ref 0.0–3.0)
Eosinophils Relative: 1.4 % (ref 0.0–5.0)
Hemoglobin: 13.1 g/dL (ref 12.0–15.0)
Lymphocytes Relative: 35.2 % (ref 12.0–46.0)
Lymphs Abs: 1.8 10*3/uL (ref 0.7–4.0)
MCHC: 33.8 g/dL (ref 30.0–36.0)
MCV: 92.1 fl (ref 78.0–100.0)
Monocytes Absolute: 0.5 10*3/uL (ref 0.1–1.0)
Monocytes Relative: 8.9 % (ref 3.0–12.0)
Neutro Abs: 2.7 10*3/uL (ref 1.4–7.7)
Neutrophils Relative %: 54 % (ref 43.0–77.0)
Platelets: 257 10*3/uL (ref 150.0–400.0)
RDW: 13.4 % (ref 11.5–14.6)

## 2012-01-21 LAB — HEPATIC FUNCTION PANEL
ALT: 19 U/L (ref 0–35)
AST: 21 U/L (ref 0–37)

## 2012-01-21 LAB — LIPID PANEL
Cholesterol: 169 mg/dL (ref 0–200)
HDL: 62.7 mg/dL (ref >39.00)

## 2012-01-21 LAB — BASIC METABOLIC PANEL
Calcium: 9.3 mg/dL (ref 8.4–10.5)
Glucose, Bld: 84 mg/dL (ref 70–99)

## 2012-01-21 LAB — TSH: TSH: 0.89 u[IU]/mL (ref 0.35–5.50)

## 2012-01-28 ENCOUNTER — Encounter: Payer: Self-pay | Admitting: Internal Medicine

## 2012-01-28 ENCOUNTER — Ambulatory Visit (INDEPENDENT_AMBULATORY_CARE_PROVIDER_SITE_OTHER): Payer: BC Managed Care – PPO | Admitting: Internal Medicine

## 2012-01-28 ENCOUNTER — Ambulatory Visit: Payer: BC Managed Care – PPO | Admitting: Endocrinology

## 2012-01-28 VITALS — BP 130/80 | HR 60 | Ht 64.75 in | Wt 170.0 lb

## 2012-01-28 DIAGNOSIS — N979 Female infertility, unspecified: Secondary | ICD-10-CM

## 2012-01-28 DIAGNOSIS — Z23 Encounter for immunization: Secondary | ICD-10-CM

## 2012-01-28 DIAGNOSIS — Z0000 Encounter for general adult medical examination without abnormal findings: Secondary | ICD-10-CM

## 2012-01-28 DIAGNOSIS — E042 Nontoxic multinodular goiter: Secondary | ICD-10-CM

## 2012-01-28 DIAGNOSIS — R51 Headache: Secondary | ICD-10-CM

## 2012-01-28 NOTE — Progress Notes (Signed)
Subjective:    Patient ID: Crystal Mcintyre, female    DOB: 08-31-1976, 36 y.o.   MRN: [id number]  HPI Patient comes in today for Preventive Health Care visit  Left  Job last MARCH  and less stress and now new job doing better. Less HAs  ( dr Clarisse Gouge)  Thyroid folowing per dr Everardo All. To be checked yearly.  Overall she's feeling much better because of less stress and work intensity. She and her husband are undergoing fertility awareness and possible treatment.  Sees Dr. Cherly Hensen. No chronic medications taken at this time.  Review of Systems ROS:  GEN/ HEENTNo fever, significant weight changes sweats  vision problems hearing changes, CV/ PULM; No chest pain shortness of breath cough, syncope,edema  change in exercise tolerance. GI /GU: No adominal pain, vomiting, change in bowel habits. No blood in the stool. No significant GU symptoms. SKIN/HEME: ,no acute skin rashes suspicious lesions or bleeding. No lymphadenopathy, nodules, masses.  NEURO/ PSYCH:  No neurologic signs such as weakness numbness No depression anxiety. IMM/ Allergy: No unusual infections.     REST of 12 system review negative Past Medical History  Diagnosis Date  . GOITER, MULTINODULAR 04/18/2009  . THYROID STIMULATING HORMONE, ABNORMAL 01/10/2009  . Other anxiety states 01/30/2010  . TOBACCO USE 01/10/2008  . IRREGULAR MENSTRUATION 06/17/2007  . Headache 06/17/2007  . TRANSAMINASES, SERUM, ELEVATED 01/10/2009    History   Social History  . Marital Status: Married    Spouse Name: N/A    Number of Children: N/A  . Years of Education: N/A   Occupational History  . 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 Smoking status: Former Smoker    Quit date: 12/29/2008  . Smokeless tobacco: Not on file  . Alcohol Use: Yes     occasional  . Drug Use: No  . Sexually Active: Yes -- Female partner(s)   Other Topics Concern  . Not on file   Social History Narrative   [employer]  credits and electrical [employer].    Past Surgical History  Procedure Date  . Tympanostomy tube placement     Family History  Problem Relation Age of Onset  . Pneumonia Father   . Alcohol abuse Father     No Known Allergies  No current outpatient prescriptions on file prior to visit.    BP 130/80  Pulse 60  Ht 5' 4.75" (1.645 m)  Wt 170 lb (77.111 kg)  BMI 28.51 kg/m2  LMP 01/10/2012       Objective:   Physical Exam Physical Exam: Vital signs reviewed QMV:HQIO is a well-developed well-nourished alert cooperative  AA female who appears her stated age in no acute distress.  HEENT: normocephalic atraumatic , Eyes: PERRL EOM's full, conjunctiva clear, Nares: paten,t no deformity discharge or tenderness., Ears: no deformity EAC's clear TMs with normal landmarks. Mouth: clear OP, no lesions, edema.  Moist mucous membranes. Dentition in adequate repair. NECK: supple without masses,  or bruits. Enlarged thyroid mostly on the left nontender CHEST/PULM:  Clear to auscultation and percussion breath sounds equal no wheeze , rales or rhonchi. No chest wall deformities or tenderness. CV: PMI is nondisplaced, S1 S2 no gallops, murmurs, rubs. Peripheral pulses are full without delay.No JVD .  Breast: normal by inspection . No dimpling, discharge, masses, tenderness or discharge . ABDOMEN: Bowel sounds normal nontender  No guard or rebound, no hepato splenomegal no CVA tenderness.  No hernia. Extremtities:  No clubbing cyanosis or edema, no acute joint swelling or redness no focal atrophy NEURO:  Oriented x3, cranial nerves 3-12 appear to be intact, no obvious focal weakness,gait within normal limits no abnormal reflexes or asymmetrical SKIN: No acute rashes normal turgor, color, no bruising or petechiae. PSYCH: Oriented, good eye contact, no obvious depression anxiety, cognition and judgment appear normal. LN: no cervical axillary inguinal adenopathy    Lab Results  Component Value Date    WBC 5.1 01/21/2012   HGB 13.1 01/21/2012   HCT 38.9 01/21/2012   PLT 257.0 01/21/2012   GLUCOSE 84 01/21/2012   CHOL 169 01/21/2012   TRIG 38.0 01/21/2012   HDL 62.70 01/21/2012   LDLCALC 99 01/21/2012   ALT 19 01/21/2012   AST 21 01/21/2012   NA 139 01/21/2012   K 4.1 01/21/2012   CL 103 01/21/2012   CREATININE 0.7 01/21/2012   BUN 9 01/21/2012   CO2 29 01/21/2012   TSH 0.89 01/21/2012           Assessment & Plan:  Preventive Health Care Counseled regarding healthy nutrition, exercise, sleep, injury prevention, calcium vit d and healthy weight . Has had nl paps and exam 2012  Related pap every 2-3 years and exam if needed before then  Anxiety much better  . Good attitude. Fertility  Issue  Under gyne eval care  Disc   Continue tobacco free ;flu shot today .  HAs stable after stress reduction.

## 2012-01-28 NOTE — Patient Instructions (Signed)
Continue lifestyle intervention healthy eating and exercise . You are on the right track.

## 2012-05-03 ENCOUNTER — Other Ambulatory Visit (HOSPITAL_COMMUNITY): Payer: Self-pay | Admitting: Obstetrics and Gynecology

## 2012-05-03 DIAGNOSIS — N979 Female infertility, unspecified: Secondary | ICD-10-CM

## 2012-05-10 ENCOUNTER — Ambulatory Visit (HOSPITAL_COMMUNITY)
Admission: RE | Admit: 2012-05-10 | Discharge: 2012-05-10 | Disposition: A | Discharge status: Home / Self Care | Payer: BC Managed Care – PPO | Source: Ambulatory Visit | Attending: Obstetrics and Gynecology | Admitting: Obstetrics and Gynecology

## 2012-05-10 DIAGNOSIS — N979 Female infertility, unspecified: Secondary | ICD-10-CM | POA: Insufficient documentation

## 2012-05-10 MED ORDER — IOHEXOL 300 MG/ML  SOLN
7.0000 mL | Freq: Once | INTRAMUSCULAR | Status: AC | PRN
  Administered 2012-05-10: 7 mL

## 2012-07-07 ENCOUNTER — Telehealth: Payer: Self-pay | Admitting: Internal Medicine

## 2012-07-07 NOTE — Telephone Encounter (Signed)
Caller: Mikiya/Patient; PCP: Madelin Headings.; CB#: [phone];  Call regarding Chest Pain/Chest Discomfort;  Reports brief episodes (1-2 minutes)  of substernal discomfort described as spasms of squeezing with increasing frequency; now nearly daily.  Onset: May 2013.  Afebrile  Discomfort is worse at night; waking her.  Denies heartburn.  Has sensation of food in chest.  MD advised it was likely indigestion or heartburn.  Requesting referral for imaging to r/o gallbladder. LMP 06/28/12.  Infertility/No contraception. Advised to see [facility] now for symptoms worse when lying down and better when sitting and leaning forward per Chest Pain Guideline.

## 2012-07-08 ENCOUNTER — Emergency Department (HOSPITAL_COMMUNITY): Payer: BC Managed Care – PPO

## 2012-07-08 ENCOUNTER — Emergency Department (HOSPITAL_COMMUNITY)
Admission: EM | Admit: 2012-07-08 | Discharge: 2012-07-08 | Disposition: A | Discharge status: Home / Self Care | Payer: BC Managed Care – PPO | Attending: Emergency Medicine | Admitting: Emergency Medicine

## 2012-07-08 ENCOUNTER — Encounter (HOSPITAL_COMMUNITY): Payer: Self-pay | Admitting: Emergency Medicine

## 2012-07-08 DIAGNOSIS — R1013 Epigastric pain: Secondary | ICD-10-CM | POA: Insufficient documentation

## 2012-07-08 DIAGNOSIS — R079 Chest pain, unspecified: Secondary | ICD-10-CM | POA: Insufficient documentation

## 2012-07-08 DIAGNOSIS — R109 Unspecified abdominal pain: Secondary | ICD-10-CM

## 2012-07-08 LAB — BASIC METABOLIC PANEL
BUN: 6 mg/dL (ref 6–23)
Chloride: 101 mEq/L (ref 96–112)
GFR calc Af Amer: >90 mL/min (ref >90)
Potassium: 3.8 mEq/L (ref 3.5–5.1)

## 2012-07-08 LAB — POCT I-STAT TROPONIN I: Troponin i, poc: 0 ng/mL (ref 0.00–0.08)

## 2012-07-08 LAB — CBC
HCT: 41 % (ref 36.0–46.0)
Hemoglobin: 13.8 g/dL (ref 12.0–15.0)
MCH: 30 pg (ref 26.0–34.0)
MCHC: 33.7 g/dL (ref 30.0–36.0)
Platelets: 289 10*3/uL (ref 150–400)
RBC: 4.6 MIL/uL (ref 3.87–5.11)
RDW: 13 % (ref 11.5–15.5)
WBC: 6.6 10*3/uL (ref 4.0–10.5)

## 2012-07-08 LAB — HEPATIC FUNCTION PANEL
AST: 23 U/L (ref 0–37)
Bilirubin, Direct: <0.1 mg/dL (ref 0.0–0.3)

## 2012-07-08 MED ORDER — OXYCODONE-ACETAMINOPHEN 5-325 MG PO TABS: 1.0000 | ORAL_TABLET | ORAL | Status: AC | PRN

## 2012-07-08 NOTE — ED Notes (Signed)
Pt c/o epigastric pain into mid sternal area x 6 months; pt sts takes meds for reflux but not helping; pt sts pain worse after eating and when laying flat

## 2012-07-08 NOTE — ED Notes (Signed)
Updated pt and family on wait time. Pt has no complaints at this time. Explained if anything changed to let RN know

## 2012-07-08 NOTE — ED Provider Notes (Signed)
History     CSN: [id number]  Arrival date & time 07/08/12  1728   First MD Initiated Contact with Patient 07/08/12 2032      Chief Complaint  Patient presents with  . Chest Pain  . Abdominal Pain  . Nausea    (Consider location/radiation/quality/duration/timing/severity/associated sxs/prior treatment) Patient is a 36 y.o. female presenting with chest pain and abdominal pain. The history is provided by the patient.  Chest Pain Primary symptoms include abdominal pain.    Abdominal Pain The primary symptoms of the illness include abdominal pain.  She has been having epigastric pain for the last several months. Pain comes in which he describes as spasms which will last for about a minute before subsiding to a very mild background pain. The back pain is rated 2/10, but the spasms are rated 10/10. It is worse after eating especially after things like to sign it although she did not have any problem and treating a ham and cheese sandwich. There is no associated nausea vomiting and no fever or chills. She's tried taking Zantac with no relief. She has not talked with her PCP about it and has had no workup thus far. Last night, pain was severe and kept her from getting to sleep.  Past Medical History  Diagnosis Date  . GOITER, MULTINODULAR 04/18/2009  . THYROID STIMULATING HORMONE, ABNORMAL 01/10/2009  . Other anxiety states 01/30/2010  . TOBACCO USE 01/10/2008  . IRREGULAR MENSTRUATION 06/17/2007  . Headache 06/17/2007  . TRANSAMINASES, SERUM, ELEVATED 01/10/2009    Past Surgical History  Procedure Date  . Tympanostomy tube placement     Family History  Problem Relation Age of Onset  . Pneumonia Father   . Alcohol abuse Father     History  Substance Use Topics  . Smoking status: Former Smoker    Quit date: 12/29/2008  . Smokeless tobacco: Not on file  . Alcohol Use: Yes     occasional    OB History    Grav Para Term Preterm Abortions TAB SAB Ect Mult Living                   Review of Systems  Cardiovascular: Positive for chest pain.  Gastrointestinal: Positive for abdominal pain.  All other systems reviewed and are negative.    Allergies  Review of patient's allergies indicates no known allergies.  Home Medications   Current Outpatient Rx  Name Route Sig Dispense Refill  . ASPIRIN-ACETAMINOPHEN-CAFFEINE 250-250-65 MG PO TABS Oral Take 2 tablets by mouth every 6 (six) hours as needed. For pain    . PRENATAL MULTIVITAMIN CH Oral Take 1 tablet by mouth daily.    Marland Kitchen RANITIDINE HCL 75 MG PO TABS Oral Take 75-150 mg by mouth 2 (two) times daily.      BP 122/77  Pulse 67  Temp 98.5 F (36.9 C) (Oral)  Resp 17  SpO2 100%  LMP 06/28/2012  Physical Exam  Nursing note and vitals reviewed.  36 year old female who is resting comfortably and in no acute distress. Vital signs are normal. Oxygen saturation is 100% which is normal. Head is normocephalic and atraumatic. PERRLA, EOMI pelvis no scleral icterus. Her Chalmers Guest is clear. Neck is nontender and supple without adenopathy or JVD. Lungs are clear without rales, wheezes, rhonchi. Back is nontender there's no CVA tenderness. Heart has regular rate rhythm without murmur. Abdomen is soft, flat, nontender without masses or hepatosplenomegaly. There is a negative Murphy sign. Extremities have no cyanosis or edema, full  range of motion is present. Skin is warm and dry without rash. Neurologic: Mental status is normal, cranial nerves are intact, there no motor or sensory deficits.  ED Course  Procedures (including critical care time)  Results for orders placed during the hospital encounter of 07/08/12  CBC      Component Value Range   WBC 6.6  4.0 - 10.5 K/uL   RBC 4.60  3.87 - 5.11 MIL/uL   Hemoglobin 13.8  12.0 - 15.0 g/dL   HCT 16.1  09.6 - 04.5 %   MCV 89.1  78.0 - 100.0 fL   MCH 30.0  26.0 - 34.0 pg   MCHC 33.7  30.0 - 36.0 g/dL   RDW 40.9  81.1 - 91.4 %   Platelets 289  150 - 400 K/uL  BASIC METABOLIC  PANEL      Component Value Range   Sodium 139  135 - 145 mEq/L   Potassium 3.8  3.5 - 5.1 mEq/L   Chloride 101  96 - 112 mEq/L   CO2 29  19 - 32 mEq/L   Glucose, Bld 84  70 - 99 mg/dL   BUN 6  6 - 23 mg/dL   Creatinine, Ser 7.82  0.50 - 1.10 mg/dL   Calcium 9.9  8.4 - 95.6 mg/dL   GFR calc non Af Amer >90  >90 mL/min   GFR calc Af Amer >90  >90 mL/min  LIPASE, BLOOD      Component Value Range   Lipase 33  11 - 59 U/L  POCT PREGNANCY, URINE      Component Value Range   Preg Test, Ur NEGATIVE  NEGATIVE  POCT I-STAT TROPONIN I      Component Value Range   Troponin i, poc 0.00  0.00 - 0.08 ng/mL   Comment 3           HEPATIC FUNCTION PANEL      Component Value Range   Total Protein 7.5  6.0 - 8.3 g/dL   Albumin 4.4  3.5 - 5.2 g/dL   AST 23  0 - 37 U/L   ALT 18  0 - 35 U/L   Alkaline Phosphatase 77  39 - 117 U/L   Total Bilirubin 0.2 (*) 0.3 - 1.2 mg/dL   Bilirubin, Direct <2.1  0.0 - 0.3 mg/dL   Indirect Bilirubin NOT CALCULATED  0.3 - 0.9 mg/dL   Dg Chest 2 View  03/05/6577  *RADIOLOGY REPORT*  Clinical Data: Chest pain.  CHEST - 2 VIEW  Comparison: None.  Findings: Two views of the chest were obtained.  The lungs are clear bilaterally.  There is scoliosis of the thoracic spine.  The heart and mediastinum are within normal limits.  No evidence for airspace disease, edema or pleural effusions.  IMPRESSION: No acute chest findings.  Thoracic scoliosis.  Original Report Authenticated By: Richarda Overlie, M.D.   US Abdomen Complete  07/08/2012  *RADIOLOGY REPORT*  Clinical Data:  Abdominal pain, chest pain and nausea.  ABDOMINAL ULTRASOUND COMPLETE  Comparison:  None  Findings:  Gallbladder:  A 0.4 cm polyp is noted within the gallbladder; the gallbladder is otherwise unremarkable in appearance.  No gallbladder wall thickening or pericholecystic fluid is seen.  No ultrasonographic Murphy's sign is seen.  Common Bile Duct:  0.3 cm in diameter; within normal limits in caliber.  Liver:  Normal  parenchymal echogenicity and echotexture; no focal lesions identified.  Limited Doppler evaluation demonstrates normal blood flow within the liver.  IVC:  Unremarkable in appearance.  Pancreas:  Although the pancreas is difficult to visualize in its entirety due to overlying bowel gas, no focal pancreatic abnormality is identified.  Spleen:  9.9 cm in length; within normal limits in size and echotexture.  Right kidney:  9.1 cm in length; normal in size, configuration and parenchymal echogenicity.  No evidence of mass or hydronephrosis.  Left kidney:  11.3 cm in length; normal in size, configuration and parenchymal echogenicity.  No evidence of mass or hydronephrosis.  Abdominal Aorta:  Normal in caliber; no aneurysm identified.  IMPRESSION:  1.  Small 0.4 cm gallbladder polyp incidentally seen; gallbladder otherwise unremarkable in appearance. 2.  Otherwise unremarkable abdominal ultrasound.  Original Report Authenticated By: Tonia Ghent, M.D.    ECG shows normal sinus rhythm with a rate of 67, no ectopy. Normal axis. Normal P wave. Normal QRS. Normal intervals. Normal ST and T waves. Impression: normal ECG. No old ECG available for comparison.   1. Abdominal pain       MDM  Abdominal pain of uncertain cause. The fact that symptoms are worse with eating a high fat foods such as those on it is suggestive of biliary tract disease, so ultrasound will be obtained. Initial laboratory workup is negative but did not include hepatic function studies hepatic function studies will be checked. Unfortunately, patient had eaten some cheese crackers just before being brought back to her room so ultrasound may not be diagnostic if gallbladder is contracted. In any case, additional workup can be done as an outpatient.   Ultrasound shows a polyp which is unlikely to be the source of her pain. She is a hardy taken H2 blockers with no relief so she will be discharged with a prescription for Percocet for pain control  and referred to gastroenterology for further workup.     Dione Booze, MD 07/08/12 [id number]

## 2012-07-08 NOTE — ED Notes (Addendum)
Pt. Reports chest pain x 3 months that has gradually worse. Localized to midsternum. " It's like a spasm. Sharp and tight for a few minutes then it goes away. It's worse after I eat. Worse when I eat lasagna or pasta.  I will take a Zantac and that usually helps. I have had this pain every day x 2 weeks.  When the pain is bad I feel very nauseated". Pain currently 2/10.

## 2012-07-08 NOTE — Telephone Encounter (Signed)
I spoke to the Crystal Mcintyre.  She says she has talked to [facility] about this problem in the past.  Over the past few months the pain has gotten worse.  She has no burning.  She does have cough, tickle in her throat and she feels like there is "food in her chest."  At times she feels like she will vomit.  The sensation is around the bottom of her bra.  She plans to go to the ER with her husband when he gets off work today.  No longer requesting anything from [facility]  She will call and inform us of what the ER says.

## 2012-07-08 NOTE — ED Notes (Signed)
Pt. D/C home. Ambulatory. A.O. X 4. NAD. Family at bedside.

## 2012-07-09 ENCOUNTER — Encounter: Payer: Self-pay | Admitting: Internal Medicine

## 2012-08-12 ENCOUNTER — Encounter: Payer: Self-pay | Admitting: Internal Medicine

## 2012-08-16 ENCOUNTER — Ambulatory Visit (INDEPENDENT_AMBULATORY_CARE_PROVIDER_SITE_OTHER): Payer: BC Managed Care – PPO | Admitting: Internal Medicine

## 2012-08-16 ENCOUNTER — Encounter: Payer: Self-pay | Admitting: Internal Medicine

## 2012-08-16 VITALS — BP 110/78 | HR 72 | Ht 64.5 in | Wt 166.0 lb

## 2012-08-16 DIAGNOSIS — K5900 Constipation, unspecified: Secondary | ICD-10-CM

## 2012-08-16 DIAGNOSIS — R1013 Epigastric pain: Secondary | ICD-10-CM

## 2012-08-16 MED ORDER — PANTOPRAZOLE SODIUM 40 MG PO TBEC: 40.0000 mg | DELAYED_RELEASE_TABLET | Freq: Every day | ORAL | Status: DC

## 2012-08-16 NOTE — Progress Notes (Signed)
Patient ID: Crystal Mcintyre, female   DOB: July 14, 1976, 36 y.o.   MRN: [id number]  SUBJECTIVE: HPI Crystal Mcintyre is a 36 year old female with a past medical history of borderline hypothyroidism, headaches who seen in consultation at the request of Dr. Fabian Sharp evaluation of epigastric pain. The patient states that she has had issues with epigastric pain dating back to late 2012. Is been worse of late. She reports a sharp epigastric pain which tends to come and go, but has been occurring frequently over last several weeks. She reports this is a crescendo decrescendo type pain. It will wake her from sleep. It does get worse with eating, and therefore better without eating. She's had some isolated nausea but no vomiting. She denies heartburn, water brash. She has noted some subtle dysphagia, noting that food feels as if it sticks in her mid chest. She's never had any food impactions or the need to regurgitate food up. She has noticed more belching, but denies early satiety. She's been using Zantac 2-3 times daily, and reports this calms the pain down. She does use Excedrin for headaches. This pain took her to the ER in July. He is noted some constipation, but not severe. No diarrhea. No rectal bleeding or melena.  Review of Systems  As per history of present illness, otherwise negative   Past Medical History  Diagnosis Date  . GOITER, MULTINODULAR 04/18/2009  . THYROID STIMULATING HORMONE, ABNORMAL 01/10/2009  . Other anxiety states 01/30/2010  . TOBACCO USE 01/10/2008  . IRREGULAR MENSTRUATION 06/17/2007  . Headache 06/17/2007  . TRANSAMINASES, SERUM, ELEVATED 01/10/2009    Current Outpatient Prescriptions  Medication Sig Dispense Refill  . aspirin-acetaminophen-caffeine (EXCEDRIN MIGRAINE) 250-250-65 MG per tablet Take 2 tablets by mouth every 6 (six) hours as needed. For pain      . oxyCODONE-acetaminophen (PERCOCET/ROXICET) 5-325 MG per tablet Take 1 tablet by mouth every 4 (four) hours as needed.        . Prenatal Vit-Fe Fumarate-FA (PRENATAL MULTIVITAMIN) TABS Take 1 tablet by mouth daily.      . pantoprazole (PROTONIX) 40 MG tablet Take 1 tablet (40 mg total) by mouth daily.  30 tablet  1    No Known Allergies  Family History  Problem Relation Age of Onset  . Pneumonia Father   . Alcohol abuse Father   . Colon polyps Mother   . Breast cancer Paternal Aunt   . Diabetes Maternal Grandfather   . Prostate cancer Paternal Grandfather     History  Substance Use Topics  . Smoking status: Former Smoker    Quit date: 12/29/2008  . Smokeless tobacco: Never Used  . Alcohol Use: No     occasional    OBJECTIVE: BP 110/78  Pulse 72  Ht 5' 4.5" (1.638 m)  Wt 166 lb (75.297 kg)  BMI 28.05 kg/m2  LMP 07/25/2012 Constitutional: Well-developed and well-nourished. No distress. HEENT: Normocephalic and atraumatic. Oropharynx is clear and moist. No oropharyngeal exudate. Conjunctivae are normal. Pupils are equal round and reactive to light. No scleral icterus. Neck: Neck supple. Trachea midline. Cardiovascular: Normal rate, regular rhythm and intact distal pulses. No M/R/G Pulmonary/chest: Effort normal and breath sounds normal. No wheezing, rales or rhonchi. Abdominal: Soft, epigastric pain without rebound or guarding, nondistended. Bowel sounds active throughout. There are no masses palpable. No hepatosplenomegaly. Extremities: no clubbing, cyanosis, or edema Lymphadenopathy: No cervical adenopathy noted. Neurological: Alert and oriented to person place and time. Skin: Skin is warm and dry. No rashes noted. Psychiatric: Normal  mood and affect. Behavior is normal.  Labs and Imaging -- CMP     Component Value Date/Time   NA 139 07/08/2012 1738   K 3.8 07/08/2012 1738   CL 101 07/08/2012 1738   CO2 29 07/08/2012 1738   GLUCOSE 84 07/08/2012 1738   BUN 6 07/08/2012 1738   CREATININE 0.66 07/08/2012 1738   CALCIUM 9.9 07/08/2012 1738   PROT 7.5 07/08/2012 2053   ALBUMIN 4.4 07/08/2012 2053    AST 23 07/08/2012 2053   ALT 18 07/08/2012 2053   ALKPHOS 77 07/08/2012 2053   BILITOT 0.2* 07/08/2012 2053   GFRNONAA >90 07/08/2012 1738   GFRAA >90 07/08/2012 1738    CBC    Component Value Date/Time   WBC 6.6 07/08/2012 1738   RBC 4.60 07/08/2012 1738   HGB 13.8 07/08/2012 1738   HCT 41.0 07/08/2012 1738   PLT 289 07/08/2012 1738   MCV 89.1 07/08/2012 1738   MCH 30.0 07/08/2012 1738   MCHC 33.7 07/08/2012 1738   RDW 13.0 07/08/2012 1738   LYMPHSABS 1.8 01/21/2012 0857   MONOABS 0.5 01/21/2012 0857   EOSABS 0.1 01/21/2012 0857   BASOSABS 0.0 01/21/2012 0857    Lipase     Component Value Date/Time   LIPASE 33 07/08/2012 1738   ABDOMINAL ULTRASOUND COMPLETE - 07/08/12   Comparison:  None   Findings:   Gallbladder:  A 0.4 cm polyp is noted within the gallbladder; the gallbladder is otherwise unremarkable in appearance.  No gallbladder wall thickening or pericholecystic fluid is seen.  No ultrasonographic Murphy's sign is seen.   Common Bile Duct:  0.3 cm in diameter; within normal limits in caliber.   Liver:  Normal parenchymal echogenicity and echotexture; no focal lesions identified.  Limited Doppler evaluation demonstrates normal blood flow within the liver.   IVC:  Unremarkable in appearance.   Pancreas:  Although the pancreas is difficult to visualize in its entirety due to overlying bowel gas, no focal pancreatic abnormality is identified.   Spleen:  9.9 cm in length; within normal limits in size and echotexture.   Right kidney:  9.1 cm in length; normal in size, configuration and parenchymal echogenicity.  No evidence of mass or hydronephrosis.   Left kidney:  11.3 cm in length; normal in size, configuration and parenchymal echogenicity.  No evidence of mass or hydronephrosis.   Abdominal Aorta:  Normal in caliber; no aneurysm identified.   IMPRESSION:   1.  Small 0.4 cm gallbladder polyp incidentally seen; gallbladder otherwise unremarkable in  appearance. 2.  Otherwise unremarkable abdominal ultrasound.    ASSESSMENT AND PLAN: Crystal Mcintyre is a 36 year old female with a past medical history of borderline hypothyroidism, headaches who seen in consultation at the request of Dr. Fabian Sharp evaluation of epigastric pain.  1. Epigastric abdominal pain -- the differential for the patient's current symptoms includes acid/peptic disease and biliary disease. Her symptoms do improve somewhat with H2 blocker, and at this point I feel upper endoscopy is warranted for further evaluation. I would like her to limit her Excedrin use as this can cause gastric upset and ulceration.  Her ultrasound was remarkable for a 4 mm gallbladder polyp, but no stones. There was no other signs of cholecystitis. If her upper endoscopy is unrevealing, then I will recommend HIDA vs. GSU consult.  If upper GI pathology is down upper endoscopy, I recommend repeating her abdominal ultrasound in 6 months to reevaluate the gallbladder polyp and assess for stability. We'll also start her  on Protonix 40 mg daily. She is reminded to take this 30 minutes to one hour before her first meal of the day.  2. Constipation -- mild, no therapy at this point. Likely related to recent and intermittent use of oxycodone for the pain is described in #1  Further recommendations after endoscopy

## 2012-08-16 NOTE — Patient Instructions (Addendum)
You have been scheduled for an endoscopy with propofol. Please follow written instructions given to you at your visit today. If you use inhalers (even only as needed), please bring them with you on the day of your procedure.   Stop taking Zantac  We have sent the following medications to your pharmacy for you to pick up at your convenience: Protonix, please take as directed.

## 2012-08-19 ENCOUNTER — Other Ambulatory Visit: Payer: Self-pay | Admitting: Gastroenterology

## 2012-08-19 ENCOUNTER — Encounter: Payer: Self-pay | Admitting: Internal Medicine

## 2012-08-19 ENCOUNTER — Telehealth: Payer: Self-pay | Admitting: Gastroenterology

## 2012-08-19 ENCOUNTER — Ambulatory Visit (AMBULATORY_SURGERY_CENTER): Payer: BC Managed Care – PPO | Admitting: Internal Medicine

## 2012-08-19 VITALS — BP 130/74 | HR 96 | Temp 98.1°F | Resp 15 | Ht 64.0 in | Wt 166.0 lb

## 2012-08-19 DIAGNOSIS — K259 Gastric ulcer, unspecified as acute or chronic, without hemorrhage or perforation: Secondary | ICD-10-CM

## 2012-08-19 DIAGNOSIS — R1013 Epigastric pain: Secondary | ICD-10-CM

## 2012-08-19 DIAGNOSIS — K2960 Other gastritis without bleeding: Secondary | ICD-10-CM

## 2012-08-19 DIAGNOSIS — K2970 Gastritis, unspecified, without bleeding: Secondary | ICD-10-CM

## 2012-08-19 MED ORDER — PANTOPRAZOLE SODIUM 40 MG PO TBEC: 40.0000 mg | DELAYED_RELEASE_TABLET | Freq: Two times a day (BID) | ORAL | Status: DC

## 2012-08-19 MED ORDER — SODIUM CHLORIDE 0.9 % IV SOLN: 500.0000 mL | INTRAVENOUS | Status: DC

## 2012-08-19 NOTE — Progress Notes (Signed)
The pt tolerated the egd very well. Andrey Farmer, CRNA increased o2 to 15 liters nasal canula to give a blow by of o2.  Joyce Gross also suctioned the pt during the egd and also post procedure. Maw

## 2012-08-19 NOTE — Op Note (Signed)
[facility] [address] [place], [address]   ENDOSCOPY PROCEDURE REPORT  PATIENT: Crystal, Mcintyre  MR#: [id number] BIRTHDATE: 1976-06-05 , 36  yrs. old GENDER: Female ENDOSCOPIST: Beverley Fiedler, MD REFERRED BY:  Madelin Headings, M.D. PROCEDURE DATE:  08/19/2012 PROCEDURE:  EGD w/ biopsy ASA CLASS:     Class I INDICATIONS:  epigastric pain. MEDICATIONS: Propofol (Diprivan) 230 mg IV TOPICAL ANESTHETIC: none  DESCRIPTION OF PROCEDURE: After the risks benefits and alternatives of the procedure were thoroughly explained, informed consent was obtained.  The LB-GIF Q180 [id number] endoscope was introduced through the mouth and advanced to the second portion of the duodenum. Without limitations.  The instrument was slowly withdrawn as the mucosa was fully examined.   ESOPHAGUS: The mucosa of the esophagus appeared normal.  STOMACH: A small hiatus hernia was found.   Two non-bleeding clean-based ulcers were found in the gastric antrum.  Biopsies were taken.   Severe erosive gastritis (inflammation) was found in the gastric antrum.  Multiple biopsies were performed using cold forceps.  DUODENUM: Mild duodenal inflammation was found in the duodenal bulb. The duodenal mucosa showed no abnormalities in the 2nd part of the duodenum.  Retroflexed views revealed a hiatal hernia.     The scope was then withdrawn from the patient and the procedure completed.  COMPLICATIONS: There were no complications. ENDOSCOPIC IMPRESSION: 1.   The mucosa of the esophagus appeared normal 2.   Hiatus hernia was found 3.   Two small non-bleeding ulcers were found in the gastric antrum 4.   Erosive gastritis (inflammation) was found in the gastric antrum; multiple biopsies 5.   Duodenal inflammation was found in the duodenal bulb 6.   The duodenal mucosa showed no abnormalities in the 2nd part of the duodenum  RECOMMENDATIONS: 1.  Await pathology results 2.  avoid NSAIDS 3.   follow-up of helicobacter pylori status, treat if indicated 4. Repeat EGD in 3 months to ensure healing of ulcers 5.  Increase pantoprazole to 40 mg twice daily (30 minutes to one hour before your 1st and last meal of the day)  REPEAT EXAM: In 3 month(s)  for EGD .  eSigned:  Beverley Fiedler, MD 08/19/2012 9:19 AM   QQ:VZDGL Lonie Peak, MD and The Patient  PATIENT NAME:  Crystal, Mcintyre MR#: [id number]

## 2012-08-19 NOTE — Telephone Encounter (Signed)
Scheduled pt for a U/S of the abdomin in February 2014, sent pt a letter with appointment information.  I will re-sent letter once it gets closer to appointment time

## 2012-08-19 NOTE — Progress Notes (Signed)
Patient did not experience any of the following events: a burn prior to discharge; a fall within the facility; wrong site/side/patient/procedure/implant event; or a hospital transfer or hospital admission upon discharge from the facility. (G8907) Patient did not have preoperative order for IV antibiotic SSI prophylaxis. (G8918)  

## 2012-08-19 NOTE — Patient Instructions (Addendum)

## 2012-08-20 ENCOUNTER — Telehealth: Payer: Self-pay

## 2012-08-20 NOTE — Telephone Encounter (Signed)
  Follow up Call-  Call back number 08/19/2012  Post procedure Call Back phone  # [phone]  Permission to leave phone message Yes     Patient questions:  Do you have a fever, pain , or abdominal swelling? no Pain Score  0 *  Have you tolerated food without any problems? yes  Have you been able to return to your normal activities? yes  Do you have any questions about your discharge instructions: Diet   no Medications  no Follow up visit  no  Do you have questions or concerns about your Care? no  Actions: * If pain score is 4 or above: No action needed, pain <4.

## 2012-08-25 ENCOUNTER — Encounter: Payer: Self-pay | Admitting: Internal Medicine

## 2012-10-05 ENCOUNTER — Other Ambulatory Visit: Payer: Self-pay | Admitting: Gastroenterology

## 2012-10-05 MED ORDER — OMEPRAZOLE 20 MG PO CPDR: 20.0000 mg | DELAYED_RELEASE_CAPSULE | Freq: Two times a day (BID) | ORAL | Status: DC

## 2012-10-21 ENCOUNTER — Encounter: Payer: Self-pay | Admitting: Internal Medicine

## 2012-10-26 ENCOUNTER — Encounter: Payer: Self-pay | Admitting: Internal Medicine

## 2012-11-08 ENCOUNTER — Ambulatory Visit (AMBULATORY_SURGERY_CENTER): Payer: BC Managed Care – PPO | Admitting: *Deleted

## 2012-11-08 DIAGNOSIS — K250 Acute gastric ulcer with hemorrhage: Secondary | ICD-10-CM

## 2012-11-09 ENCOUNTER — Encounter: Payer: Self-pay | Admitting: Internal Medicine

## 2012-11-19 ENCOUNTER — Ambulatory Visit (AMBULATORY_SURGERY_CENTER): Payer: BC Managed Care – PPO | Admitting: Internal Medicine

## 2012-11-19 ENCOUNTER — Encounter: Payer: Self-pay | Admitting: Internal Medicine

## 2012-11-19 VITALS — BP 117/74 | HR 72 | Temp 98.0°F | Resp 20 | Ht 64.0 in | Wt 166.0 lb

## 2012-11-19 DIAGNOSIS — Z8719 Personal history of other diseases of the digestive system: Secondary | ICD-10-CM

## 2012-11-19 DIAGNOSIS — Z8711 Personal history of peptic ulcer disease: Secondary | ICD-10-CM

## 2012-11-19 DIAGNOSIS — K253 Acute gastric ulcer without hemorrhage or perforation: Secondary | ICD-10-CM

## 2012-11-19 DIAGNOSIS — K250 Acute gastric ulcer with hemorrhage: Secondary | ICD-10-CM

## 2012-11-19 MED ORDER — SODIUM CHLORIDE 0.9 % IV SOLN: 500.0000 mL | INTRAVENOUS | Status: DC

## 2012-11-19 NOTE — Op Note (Signed)
[facility] [address] [place], [address]   ENDOSCOPY PROCEDURE REPORT  PATIENT: Crystal Mcintyre, Crystal Mcintyre  MR#: [id number] BIRTHDATE: [birth year] , 36  yrs. old GENDER: Female ENDOSCOPIST: Beverley Fiedler, MD REFERRED BY: PROCEDURE DATE:  11/19/2012 PROCEDURE:  EGD, diagnostic ASA CLASS:     Class II INDICATIONS:  Surveillance of gastric ulcers, last EGD MEDICATIONS: MAC sedation, administered by CRNA and Propofol (Diprivan) 280 mg IV TOPICAL ANESTHETIC: Cetacaine Spray  DESCRIPTION OF PROCEDURE: After the risks benefits and alternatives of the procedure were thoroughly explained, informed consent was obtained.  The LB GIF-H180 [id number] endoscope was introduced through the mouth and advanced to the second portion of the duodenum. Without limitations.  The instrument was slowly withdrawn as the mucosa was fully examined.    ESOPHAGUS: The mucosa of the esophagus appeared normal.  STOMACH: There was mild antral gastropathy characterized by mild erythema was noted.  The previously observed gastric ulcers have healed. The stomach is otherwise appeared normal.  DUODENUM: The duodenal mucosa showed no abnormalities in the bulb and second portion of the duodenum. Retroflexed views revealed no abnormalities.     The scope was then withdrawn from the patient and the procedure completed.  COMPLICATIONS: There were no complications. ENDOSCOPIC IMPRESSION: 1.   The mucosa of the esophagus appeared normal 2.   There was mild antral gastropathy noted.  Gastric ulcers have healed. 3.   The stomach otherwise appeared normal 4.   The duodenal mucosa showed no abnormalities in the bulb and second portion of the duodenum  RECOMMENDATIONS: 1.  Can reduce to omeprazole 20 mg once daily.  If completely avoiding NSAIDs, then omeprazole could be discontinued 2.  Office follow-up as needed  eSigned:  Beverley Fiedler, MD 11/19/2012 1:54 PM   CC:The Patient and Madelin Headings,  MD

## 2012-11-19 NOTE — Progress Notes (Signed)
Patient did not have preoperative order for IV antibiotic SSI prophylaxis. (G8918)   

## 2012-11-19 NOTE — Patient Instructions (Addendum)
YOU HAD AN ENDOSCOPIC PROCEDURE TODAY AT THE [facility]: Refer to the procedure report that was given to you for any specific questions about what was found during the examination.  If the procedure report does not answer your questions, please call your gastroenterologist to clarify.  If you requested that your care partner not be given the details of your procedure findings, then the procedure report has been included in a sealed envelope for you to review at your convenience later.  YOU SHOULD EXPECT: Some feelings of bloating in the abdomen. Passage of more gas than usual.  Walking can help get rid of the air that was put into your GI tract during the procedure and reduce the bloating. If you had a lower endoscopy (such as a colonoscopy or flexible sigmoidoscopy)  DIET: Your first meal following the procedure should be a light meal and then it is ok to progress to your normal diet.  A half-sandwich or bowl of soup is an example of a good first meal.  Heavy or fried foods are harder to digest and may make you feel nauseous or bloated.  Likewise meals heavy in dairy and vegetables can cause extra gas to form and this can also increase the bloating.  Drink plenty of fluids but you should avoid alcoholic beverages for 24 hours.  ACTIVITY: Your care partner should take you home directly after the procedure.  You should plan to take it easy, moving slowly for the rest of the day.  You can resume normal activity the day after the procedure however you should NOT DRIVE or use heavy machinery for 24 hours (because of the sedation medicines used during the test).    SYMPTOMS TO REPORT IMMEDIATELY: A gastroenterologist can be reached at any hour.  During normal business hours, 8:30 AM to 5:00 PM Monday through Friday, call [phone].  After hours and on weekends, please call the GI answering service at [phone] who will take a message and have the physician on call contact  you.   Following upper endoscopy (EGD)  Vomiting of blood or coffee ground material  New chest pain or pain under the shoulder blades  Painful or persistently difficult swallowing  New shortness of breath  Fever of 100F or higher  Black, tarry-looking stools  FOLLOW UP: If any biopsies were taken you will be contacted by phone or by letter within the next 1-3 weeks.  Call your gastroenterologist if you have not heard about the biopsies in 3 weeks.  Our staff will call the home number listed on your records the next business day following your procedure to check on you and address any questions or concerns that you may have at that time regarding the information given to you following your procedure. This is a courtesy call and so if there is no answer at the home number and we have not heard from you through the emergency physician on call, we will assume that you have returned to your regular daily activities without incident.  SIGNATURES/CONFIDENTIALITY: You and/or your care partner have signed paperwork which will be entered into your electronic medical record.  These signatures attest to the fact that that the information above on your After Visit Summary has been reviewed and is understood.  Full responsibility of the confidentiality of this discharge information lies with you and/or your care-partner.

## 2012-11-22 ENCOUNTER — Telehealth: Payer: Self-pay | Admitting: *Deleted

## 2012-11-22 NOTE — Telephone Encounter (Signed)
No answer, message left for the patient. 

## 2013-02-14 ENCOUNTER — Ambulatory Visit (HOSPITAL_COMMUNITY): Payer: BC Managed Care – PPO

## 2013-03-02 ENCOUNTER — Ambulatory Visit (HOSPITAL_COMMUNITY)
Admission: RE | Admit: 2013-03-02 | Discharge: 2013-03-02 | Disposition: A | Discharge status: Home / Self Care | Payer: BC Managed Care – PPO | Source: Ambulatory Visit | Attending: Internal Medicine | Admitting: Internal Medicine

## 2013-03-02 DIAGNOSIS — R1013 Epigastric pain: Secondary | ICD-10-CM

## 2013-03-02 DIAGNOSIS — R109 Unspecified abdominal pain: Secondary | ICD-10-CM | POA: Insufficient documentation

## 2013-03-02 DIAGNOSIS — K824 Cholesterolosis of gallbladder: Secondary | ICD-10-CM | POA: Insufficient documentation

## 2013-03-02 DIAGNOSIS — K838 Other specified diseases of biliary tract: Secondary | ICD-10-CM | POA: Insufficient documentation

## 2013-05-19 ENCOUNTER — Emergency Department (HOSPITAL_COMMUNITY)
Admission: EM | Admit: 2013-05-19 | Discharge: 2013-05-19 | Disposition: A | Discharge status: Home / Self Care | Payer: BC Managed Care – PPO | Source: Home / Self Care | Attending: Family Medicine | Admitting: Family Medicine

## 2013-05-19 ENCOUNTER — Encounter (HOSPITAL_COMMUNITY): Payer: Self-pay | Admitting: Emergency Medicine

## 2013-05-19 DIAGNOSIS — J329 Chronic sinusitis, unspecified: Secondary | ICD-10-CM

## 2013-05-19 DIAGNOSIS — J309 Allergic rhinitis, unspecified: Secondary | ICD-10-CM

## 2013-05-19 LAB — POCT RAPID STREP A: Streptococcus, Group A Screen (Direct): NEGATIVE

## 2013-05-19 MED ORDER — PREDNISONE 20 MG PO TABS: ORAL_TABLET | ORAL | Status: DC

## 2013-05-19 MED ORDER — IBUPROFEN 600 MG PO TABS: 600.0000 mg | ORAL_TABLET | Freq: Three times a day (TID) | ORAL | Status: DC | PRN

## 2013-05-19 MED ORDER — FEXOFENADINE-PSEUDOEPHED ER 60-120 MG PO TB12: 1.0000 | ORAL_TABLET | Freq: Two times a day (BID) | ORAL | Status: DC

## 2013-05-19 MED ORDER — FLUTICASONE PROPIONATE 50 MCG/ACT NA SUSP: 2.0000 | Freq: Every day | NASAL | Status: DC

## 2013-05-19 MED ORDER — BENZONATATE 100 MG PO CAPS: 100.0000 mg | ORAL_CAPSULE | Freq: Three times a day (TID) | ORAL | Status: DC

## 2013-05-19 MED ORDER — AMOXICILLIN 500 MG PO CAPS: 500.0000 mg | ORAL_CAPSULE | Freq: Three times a day (TID) | ORAL | Status: DC

## 2013-05-19 NOTE — ED Notes (Addendum)
Pt c/o cold/allergy sx onset 1 week Sx include: HA, ST, dry cough, nauseas, chills, nasal congestion, post nasal drip Denies: f/v/d, SOB, wheezing Taking OTC benadryl and cough syrup Hx of migraines  She is alert and oriented w/no signs of acute distress.

## 2013-05-19 NOTE — ED Provider Notes (Signed)
History     CSN: [id number]  Arrival date & time 05/19/13  1102   First MD Initiated Contact with Patient 05/19/13 1152      Chief Complaint  Patient presents with  . URI    (Consider location/radiation/quality/duration/timing/severity/associated sxs/prior treatment) HPI Comments: 37 year old female with history of seasonal allergies. Here complaining of nasal congestion sinus pressure and rhinorrhea for over one week. Symptoms worsening and now associated with frontal headache and sinus pain during the last 4 days. Also reports thick rhinorrhea with blood and green drainage. Patient has felt chills and sore throat and has persistent nonproductive cough. Denies pain with swallowing. Decreased appetite. Denies nausea or vomiting. No abdominal pain. No chest pain or difficulty breathing. No wheezing. Has taken Benadryl intermittently with no significant improvement.   Past Medical History  Diagnosis Date  . GOITER, MULTINODULAR 04/18/2009  . THYROID STIMULATING HORMONE, ABNORMAL 01/10/2009  . Other anxiety states 01/30/2010  . TOBACCO USE 01/10/2008  . IRREGULAR MENSTRUATION 06/17/2007  . Headache 06/17/2007  . TRANSAMINASES, SERUM, ELEVATED 01/10/2009    Past Surgical History  Procedure Laterality Date  . Tympanostomy tube placement  as child  . Tonsillectomy  as child  . Tympanoplasty  4 yrs ago  . Upper gastrointestinal endoscopy      Family History  Problem Relation Age of Onset  . Pneumonia Father   . Alcohol abuse Father   . Colon polyps Mother   . Breast cancer Paternal Aunt   . Diabetes Maternal Grandfather   . Prostate cancer Paternal Grandfather   . Rectal cancer Neg Hx   . Stomach cancer Neg Hx   . Colon cancer Neg Hx     History  Substance Use Topics  . Smoking status: Former Smoker    Quit date: 12/29/2008  . Smokeless tobacco: Never Used  . Alcohol Use: Yes     Comment: occasional    OB History   Grav Para Term Preterm Abortions TAB SAB Ect Mult Living                   Review of Systems  Allergies  Review of patient's allergies indicates no known allergies.  Home Medications   Current Outpatient Rx  Name  Route  Sig  Dispense  Refill  . acetaminophen (TYLENOL) 500 MG tablet   Oral   Take 500 mg by mouth every 6 (six) hours as needed.         Marland Kitchen amoxicillin (AMOXIL) 500 MG capsule   Oral   Take 1 capsule (500 mg total) by mouth 3 (three) times daily.   21 capsule   0   . aspirin-acetaminophen-caffeine (EXCEDRIN MIGRAINE) 250-250-65 MG per tablet   Oral   Take 2 tablets by mouth every 6 (six) hours as needed. For pain         . benzonatate (TESSALON) 100 MG capsule   Oral   Take 1 capsule (100 mg total) by mouth every 8 (eight) hours.   21 capsule   0   . fexofenadine-pseudoephedrine (ALLEGRA-D) 60-120 MG per tablet   Oral   Take 1 tablet by mouth every 12 (twelve) hours.   30 tablet   0   . fluticasone (FLONASE) 50 MCG/ACT nasal spray   Nasal   Place 2 sprays into the nose daily.   16 g   0   . ibuprofen (ADVIL,MOTRIN) 600 MG tablet   Oral   Take 1 tablet (600 mg total) by mouth every 8 (eight)  hours as needed for pain.   20 tablet   0   . omeprazole (PRILOSEC) 20 MG capsule   Oral   Take 1 capsule (20 mg total) by mouth 2 (two) times daily.   90 capsule   3   . predniSONE (DELTASONE) 20 MG tablet      2 tabs po daily for 5 days   10 tablet   0   . Prenatal Vit-Fe Fumarate-FA (PRENATAL MULTIVITAMIN) TABS   Oral   Take 1 tablet by mouth daily.           BP 130/81  Pulse 82  Temp(Src) 98.2 F (36.8 C) (Oral)  Resp 22  SpO2 100%  LMP 05/05/2013  Physical Exam  Nursing note and vitals reviewed. Constitutional: She is oriented to person, place, and time. She appears well-developed and well-nourished. No distress.  HENT:  Head: Normocephalic and atraumatic.  Nasal Congestion with erythema and swelling of nasal turbinates, white/yellow rhinorrhea. Bilateral maxillary sinus tenderness  to palpation reported. pharyngeal erythema no exudates. No uvula deviation. No trismus. TM's with increased vascular markings and there is dullness in right side with hazy color fluid behind, no swelling or bulging  Eyes: Conjunctivae and EOM are normal. Pupils are equal, round, and reactive to light.  Neck: Neck supple. No JVD present. No thyromegaly present.  Cardiovascular: Normal rate, regular rhythm and normal heart sounds.   Pulmonary/Chest: Effort normal and breath sounds normal. No respiratory distress. She has no wheezes. She has no rales. She exhibits no tenderness.  Lymphadenopathy:    She has no cervical adenopathy.  Neurological: She is alert and oriented to person, place, and time.  Skin: No rash noted. She is not diaphoretic.    ED Course  Procedures (including critical care time)  Labs Reviewed  POCT RAPID STREP A ([facility]RG CARE ONLY)   No results found.   1. Sinusitis   2. Allergic rhinitis       MDM  Treated with Allegra-D, Flonase, ibuprofen, prednisone and amoxicillin. Supportive care and red flags that should prompt her return to medical attention discussed with patient and provided in writing.        Sharin Grave, MD 05/20/13 [id number]

## 2013-08-19 ENCOUNTER — Other Ambulatory Visit (INDEPENDENT_AMBULATORY_CARE_PROVIDER_SITE_OTHER): Payer: Self-pay

## 2013-08-19 DIAGNOSIS — Z0000 Encounter for general adult medical examination without abnormal findings: Secondary | ICD-10-CM

## 2013-08-19 LAB — CBC WITH DIFFERENTIAL/PLATELET
Basophils Absolute: 0 10*3/uL (ref 0.0–0.1)
Basophils Relative: 0.4 % (ref 0.0–3.0)
Eosinophils Absolute: 0 10*3/uL (ref 0.0–0.7)
HCT: 38 % (ref 36.0–46.0)
Hemoglobin: 12.7 g/dL (ref 12.0–15.0)
Lymphs Abs: 1.8 10*3/uL (ref 0.7–4.0)
MCHC: 33.4 g/dL (ref 30.0–36.0)
MCV: 91.1 fl (ref 78.0–100.0)
Monocytes Absolute: 0.5 10*3/uL (ref 0.1–1.0)
Neutro Abs: 1.6 10*3/uL (ref 1.4–7.7)
RBC: 4.18 Mil/uL (ref 3.87–5.11)
RDW: 12.9 % (ref 11.5–14.6)

## 2013-08-19 LAB — HEPATIC FUNCTION PANEL
Alkaline Phosphatase: 71 U/L (ref 39–117)
Bilirubin, Direct: 0 mg/dL (ref 0.0–0.3)
Total Bilirubin: 0.5 mg/dL (ref 0.3–1.2)
Total Protein: 7.2 g/dL (ref 6.0–8.3)

## 2013-08-19 LAB — BASIC METABOLIC PANEL
BUN: 9 mg/dL (ref 6–23)
CO2: 30 mEq/L (ref 19–32)
Calcium: 9.5 mg/dL (ref 8.4–10.5)
Creatinine, Ser: 0.6 mg/dL (ref 0.4–1.2)

## 2013-08-19 LAB — LIPID PANEL
Total CHOL/HDL Ratio: 3
Triglycerides: 50 mg/dL (ref 0.0–149.0)

## 2013-08-19 LAB — TSH: TSH: 0.05 u[IU]/mL — ABNORMAL LOW (ref 0.35–5.50)

## 2013-08-26 ENCOUNTER — Encounter: Payer: Self-pay | Admitting: Internal Medicine

## 2013-08-26 ENCOUNTER — Ambulatory Visit (INDEPENDENT_AMBULATORY_CARE_PROVIDER_SITE_OTHER): Payer: Self-pay | Admitting: Internal Medicine

## 2013-08-26 ENCOUNTER — Other Ambulatory Visit (HOSPITAL_COMMUNITY)
Admission: RE | Admit: 2013-08-26 | Discharge: 2013-08-26 | Disposition: A | Discharge status: Home / Self Care | Payer: 59 | Source: Ambulatory Visit | Attending: Internal Medicine | Admitting: Internal Medicine

## 2013-08-26 VITALS — BP 124/72 | HR 79 | Temp 98.1°F | Wt 165.0 lb

## 2013-08-26 DIAGNOSIS — Z0000 Encounter for general adult medical examination without abnormal findings: Secondary | ICD-10-CM

## 2013-08-26 DIAGNOSIS — K219 Gastro-esophageal reflux disease without esophagitis: Secondary | ICD-10-CM | POA: Insufficient documentation

## 2013-08-26 DIAGNOSIS — Z23 Encounter for immunization: Secondary | ICD-10-CM

## 2013-08-26 DIAGNOSIS — E079 Disorder of thyroid, unspecified: Secondary | ICD-10-CM

## 2013-08-26 DIAGNOSIS — N979 Female infertility, unspecified: Secondary | ICD-10-CM

## 2013-08-26 DIAGNOSIS — Z01419 Encounter for gynecological examination (general) (routine) without abnormal findings: Secondary | ICD-10-CM | POA: Insufficient documentation

## 2013-08-26 DIAGNOSIS — E042 Nontoxic multinodular goiter: Secondary | ICD-10-CM

## 2013-08-26 DIAGNOSIS — R51 Headache: Secondary | ICD-10-CM

## 2013-08-26 NOTE — Patient Instructions (Addendum)
Will notify you  of labs when available. And have you make appt with Dr Everardo All about the abnormal Thyroid test. Wean the omeprazole and take some when you have to take headache medication that could flare up your  Hernia. Make sure you are on folic acid   Will send copy of pap results to dr Cherly Hensen when available,   Preventive Care for Adults, Female A healthy lifestyle and preventive care can promote health and wellness. Preventive health guidelines for women include the following key practices.  A routine yearly physical is a good way to check with your caregiver about your health and preventive screening. It is a chance to share any concerns and updates on your health, and to receive a thorough exam.  Visit your dentist for a routine exam and preventive care every 6 months. Brush your teeth twice a day and floss once a day. Good oral hygiene prevents tooth decay and gum disease.  The frequency of eye exams is based on your age, health, family medical history, use of contact lenses, and other factors. Follow your caregiver's recommendations for frequency of eye exams.  Eat a healthy diet. Foods like vegetables, fruits, whole grains, low-fat dairy products, and lean protein foods contain the nutrients you need without too many calories. Decrease your intake of foods high in solid fats, added sugars, and salt. Eat the right amount of calories for you.Get information about a proper diet from your caregiver, if necessary.  Regular physical exercise is one of the most important things you can do for your health. Most adults should get at least 150 minutes of moderate-intensity exercise (any activity that increases your heart rate and causes you to sweat) each week. In addition, most adults need muscle-strengthening exercises on 2 or more days a week.  Maintain a healthy weight. The body mass index (BMI) is a screening tool to identify possible weight problems. It provides an estimate of body fat  based on height and weight. Your caregiver can help determine your BMI, and can help you achieve or maintain a healthy weight.For adults 20 years and older:  A BMI below 18.5 is considered underweight.  A BMI of 18.5 to 24.9 is normal.  A BMI of 25 to 29.9 is considered overweight.  A BMI of 30 and above is considered obese.  Maintain normal blood lipids and cholesterol levels by exercising and minimizing your intake of saturated fat. Eat a balanced diet with plenty of fruit and vegetables. Blood tests for lipids and cholesterol should begin at age 73 and be repeated every 5 years. If your lipid or cholesterol levels are high, you are over 50, or you are at high risk for heart disease, you may need your cholesterol levels checked more frequently.Ongoing high lipid and cholesterol levels should be treated with medicines if diet and exercise are not effective.  If you smoke, find out from your caregiver how to quit. If you do not use tobacco, do not start.  If you are pregnant, do not drink alcohol. If you are breastfeeding, be very cautious about drinking alcohol. If you are not pregnant and choose to drink alcohol, do not exceed 1 drink per day. One drink is considered to be 12 ounces (355 mL) of beer, 5 ounces (148 mL) of wine, or 1.5 ounces (44 mL) of liquor.  Avoid use of street drugs. Do not share needles with anyone. Ask for help if you need support or instructions about stopping the use of drugs.  High  blood pressure causes heart disease and increases the risk of stroke. Your blood pressure should be checked at least every 1 to 2 years. Ongoing high blood pressure should be treated with medicines if weight loss and exercise are not effective.  If you are 40 to 38 years old, ask your caregiver if you should take aspirin to prevent strokes.  Diabetes screening involves taking a blood sample to check your fasting blood sugar level. This should be done once every 3 years, after age 55, if  you are within normal weight and without risk factors for diabetes. Testing should be considered at a younger age or be carried out more frequently if you are overweight and have at least 1 risk factor for diabetes.  Breast cancer screening is essential preventive care for women. You should practice "breast self-awareness." This means understanding the normal appearance and feel of your breasts and may include breast self-examination. Any changes detected, no matter how small, should be reported to a caregiver. Women in their 25s and 30s should have a clinical breast exam (CBE) by a caregiver as part of a regular health exam every 1 to 3 years. After age 55, women should have a CBE every year. Starting at age 80, women should consider having a mammography (breast X-ray test) every year. Women who have a family history of breast cancer should talk to their caregiver about genetic screening. Women at a high risk of breast cancer should talk to their caregivers about having magnetic resonance imaging (MRI) and a mammography every year.  The Pap test is a screening test for cervical cancer. A Pap test can show cell changes on the cervix that might become cervical cancer if left untreated. A Pap test is a procedure in which cells are obtained and examined from the lower end of the uterus (cervix).  Women should have a Pap test starting at age 76.  Between ages 37 and 22, Pap tests should be repeated every 2 years.  Beginning at age 56, you should have a Pap test every 3 years as long as the past 3 Pap tests have been normal.  Some women have medical problems that increase the chance of getting cervical cancer. Talk to your caregiver about these problems. It is especially important to talk to your caregiver if a new problem develops soon after your last Pap test. In these cases, your caregiver may recommend more frequent screening and Pap tests.  The above recommendations are the same for women who have or  have not gotten the vaccine for human papillomavirus (HPV).  If you had a hysterectomy for a problem that was not cancer or a condition that could lead to cancer, then you no longer need Pap tests. Even if you no longer need a Pap test, a regular exam is a good idea to make sure no other problems are starting.  If you are between ages 25 and 17, and you have had normal Pap tests going back 10 years, you no longer need Pap tests. Even if you no longer need a Pap test, a regular exam is a good idea to make sure no other problems are starting.  If you have had past treatment for cervical cancer or a condition that could lead to cancer, you need Pap tests and screening for cancer for at least 20 years after your treatment.  If Pap tests have been discontinued, risk factors (such as a new sexual partner) need to be reassessed to determine if screening  should be resumed.  The HPV test is an additional test that may be used for cervical cancer screening. The HPV test looks for the virus that can cause the cell changes on the cervix. The cells collected during the Pap test can be tested for HPV. The HPV test could be used to screen women aged 88 years and older, and should be used in women of any age who have unclear Pap test results. After the age of 20, women should have HPV testing at the same frequency as a Pap test.  Colorectal cancer can be detected and often prevented. Most routine colorectal cancer screening begins at the age of 14 and continues through age 17. However, your caregiver may recommend screening at an earlier age if you have risk factors for colon cancer. On a yearly basis, your caregiver may provide home test kits to check for hidden blood in the stool. Use of a small camera at the end of a tube, to directly examine the colon (sigmoidoscopy or colonoscopy), can detect the earliest forms of colorectal cancer. Talk to your caregiver about this at age 70, when routine screening begins. Direct  examination of the colon should be repeated every 5 to 10 years through age 10, unless early forms of pre-cancerous polyps or small growths are found.  Hepatitis C blood testing is recommended for all people born from [birth year] through 1965 and any individual with known risks for hepatitis C.  Practice safe sex. Use condoms and avoid high-risk sexual practices to reduce the spread of sexually transmitted infections (STIs). STIs include gonorrhea, chlamydia, syphilis, trichomonas, herpes, HPV, and human immunodeficiency virus (HIV). Herpes, HIV, and HPV are viral illnesses that have no cure. They can result in disability, cancer, and death. Sexually active women aged 107 and younger should be checked for chlamydia. Older women with new or multiple partners should also be tested for chlamydia. Testing for other STIs is recommended if you are sexually active and at increased risk.  Osteoporosis is a disease in which the bones lose minerals and strength with aging. This can result in serious bone fractures. The risk of osteoporosis can be identified using a bone density scan. Women ages 39 and over and women at risk for fractures or osteoporosis should discuss screening with their caregivers. Ask your caregiver whether you should take a calcium supplement or vitamin D to reduce the rate of osteoporosis.  Menopause can be associated with physical symptoms and risks. Hormone replacement therapy is available to decrease symptoms and risks. You should talk to your caregiver about whether hormone replacement therapy is right for you.  Use sunscreen with sun protection factor (SPF) of 30 or more. Apply sunscreen liberally and repeatedly throughout the day. You should seek shade when your shadow is shorter than you. Protect yourself by wearing long sleeves, pants, a wide-brimmed hat, and sunglasses year round, whenever you are outdoors.  Once a month, do a whole body skin exam, using a mirror to look at the skin on your  back. Notify your caregiver of new moles, moles that have irregular borders, moles that are larger than a pencil eraser, or moles that have changed in shape or color.  Stay current with required immunizations.  Influenza. You need a dose every fall (or winter). The composition of the flu vaccine changes each year, so being vaccinated once is not enough.  Pneumococcal polysaccharide. You need 1 to 2 doses if you smoke cigarettes or if you have certain chronic medical conditions. You  need 1 dose at age 43 (or older) if you have never been vaccinated.  Tetanus, diphtheria, pertussis (Tdap, Td). Get 1 dose of Tdap vaccine if you are younger than age 41, are over 40 and have contact with an infant, are a Research scientist (physical sciences), are pregnant, or simply want to be protected from whooping cough. After that, you need a Td booster dose every 10 years. Consult your caregiver if you have not had at least 3 tetanus and diphtheria-containing shots sometime in your life or have a deep or dirty wound.  HPV. You need this vaccine if you are a woman age 71 or younger. The vaccine is given in 3 doses over 6 months.  Measles, mumps, rubella (MMR). You need at least 1 dose of MMR if you were born in 1957 or later. You may also need a second dose.  Meningococcal. If you are age 5 to 69 and a first-year college student living in a residence hall, or have one of several medical conditions, you need to get vaccinated against meningococcal disease. You may also need additional booster doses.  Zoster (shingles). If you are age 71 or older, you should get this vaccine.  Varicella (chickenpox). If you have never had chickenpox or you were vaccinated but received only 1 dose, talk to your caregiver to find out if you need this vaccine.  Hepatitis A. You need this vaccine if you have a specific risk factor for hepatitis A virus infection or you simply wish to be protected from this disease. The vaccine is usually given as 2 doses,  6 to 18 months apart.  Hepatitis B. You need this vaccine if you have a specific risk factor for hepatitis B virus infection or you simply wish to be protected from this disease. The vaccine is given in 3 doses, usually over 6 months. Preventive Services / Frequency Ages 6 to 32  Blood pressure check.** / Every 1 to 2 years.  Lipid and cholesterol check.** / Every 5 years beginning at age 36.  Clinical breast exam.** / Every 3 years for women in their 23s and 30s.  Pap test.** / Every 2 years from ages 52 through 79. Every 3 years starting at age 57 through age 68 or 67 with a history of 3 consecutive normal Pap tests.  HPV screening.** / Every 3 years from ages 68 through ages 12 to 81 with a history of 3 consecutive normal Pap tests.  Hepatitis C blood test.** / For any individual with known risks for hepatitis C.  Skin self-exam. / Monthly.  Influenza immunization.** / Every year.  Pneumococcal polysaccharide immunization.** / 1 to 2 doses if you smoke cigarettes or if you have certain chronic medical conditions.  Tetanus, diphtheria, pertussis (Tdap, Td) immunization. / A one-time dose of Tdap vaccine. After that, you need a Td booster dose every 10 years.  HPV immunization. / 3 doses over 6 months, if you are 31 and younger.  Measles, mumps, rubella (MMR) immunization. / You need at least 1 dose of MMR if you were born in 1957 or later. You may also need a second dose.  Meningococcal immunization. / 1 dose if you are age 70 to 59 and a first-year college student living in a residence hall, or have one of several medical conditions, you need to get vaccinated against meningococcal disease. You may also need additional booster doses.  Varicella immunization.** / Consult your caregiver.  Hepatitis A immunization.** / Consult your caregiver. 2 doses, 6 to  18 months apart.  Hepatitis B immunization.** / Consult your caregiver. 3 doses usually over 6 months. Ages 49 to  56  Blood pressure check.** / Every 1 to 2 years.  Lipid and cholesterol check.** / Every 5 years beginning at age 20.  Clinical breast exam.** / Every year after age 76.  Mammogram.** / Every year beginning at age 39 and continuing for as long as you are in good health. Consult with your caregiver.  Pap test.** / Every 3 years starting at age 52 through age 65 or 83 with a history of 3 consecutive normal Pap tests.  HPV screening.** / Every 3 years from ages 44 through ages 65 to 105 with a history of 3 consecutive normal Pap tests.  Fecal occult blood test (FOBT) of stool. / Every year beginning at age 45 and continuing until age 12. You may not need to do this test if you get a colonoscopy every 10 years.  Flexible sigmoidoscopy or colonoscopy.** / Every 5 years for a flexible sigmoidoscopy or every 10 years for a colonoscopy beginning at age 54 and continuing until age 48.  Hepatitis C blood test.** / For all people born from [birth year] through 1966 and any individual with known risks for hepatitis C.  Skin self-exam. / Monthly.  Influenza immunization.** / Every year.  Pneumococcal polysaccharide immunization.** / 1 to 2 doses if you smoke cigarettes or if you have certain chronic medical conditions.  Tetanus, diphtheria, pertussis (Tdap, Td) immunization.** / A one-time dose of Tdap vaccine. After that, you need a Td booster dose every 10 years.  Measles, mumps, rubella (MMR) immunization. / You need at least 1 dose of MMR if you were born in 1957 or later. You may also need a second dose.  Varicella immunization.** / Consult your caregiver.  Meningococcal immunization.** / Consult your caregiver.  Hepatitis A immunization.** / Consult your caregiver. 2 doses, 6 to 18 months apart.  Hepatitis B immunization.** / Consult your caregiver. 3 doses, usually over 6 months. Ages 30 and over  Blood pressure check.** / Every 1 to 2 years.  Lipid and cholesterol check.** / Every 5 years  beginning at age 69.  Clinical breast exam.** / Every year after age 69.  Mammogram.** / Every year beginning at age 68 and continuing for as long as you are in good health. Consult with your caregiver.  Pap test.** / Every 3 years starting at age 79 through age 51 or 80 with a 3 consecutive normal Pap tests. Testing can be stopped between 65 and 70 with 3 consecutive normal Pap tests and no abnormal Pap or HPV tests in the past 10 years.  HPV screening.** / Every 3 years from ages 53 through ages 25 or 84 with a history of 3 consecutive normal Pap tests. Testing can be stopped between 65 and 70 with 3 consecutive normal Pap tests and no abnormal Pap or HPV tests in the past 10 years.  Fecal occult blood test (FOBT) of stool. / Every year beginning at age 17 and continuing until age 17. You may not need to do this test if you get a colonoscopy every 10 years.  Flexible sigmoidoscopy or colonoscopy.** / Every 5 years for a flexible sigmoidoscopy or every 10 years for a colonoscopy beginning at age 22 and continuing until age 35.  Hepatitis C blood test.** / For all people born from [birth year] through 1966 and any individual with known risks for hepatitis C.  Osteoporosis screening.** / A  one-time screening for women ages 16 and over and women at risk for fractures or osteoporosis.  Skin self-exam. / Monthly.  Influenza immunization.** / Every year.  Pneumococcal polysaccharide immunization.** / 1 dose at age 40 (or older) if you have never been vaccinated.  Tetanus, diphtheria, pertussis (Tdap, Td) immunization. / A one-time dose of Tdap vaccine if you are over 65 and have contact with an infant, are a Research scientist (physical sciences), or simply want to be protected from whooping cough. After that, you need a Td booster dose every 10 years.  Varicella immunization.** / Consult your caregiver.  Meningococcal immunization.** / Consult your caregiver.  Hepatitis A immunization.** / Consult your caregiver. 2  doses, 6 to 18 months apart.  Hepatitis B immunization.** / Check with your caregiver. 3 doses, usually over 6 months. ** Family history and personal history of risk and conditions may change your caregiver's recommendations. Document Released: 02/10/2002 Document Revised: 03/08/2012 Document Reviewed: 05/12/2011 [facility] Patient Information 2014 [place], [place].

## 2013-08-26 NOTE — Progress Notes (Signed)
Chief Complaint  Patient presents with  . Annual Exam    pap    HPI: Patient comes in today for Preventive Health Care visit  Since her last visit she has had a GI evaluation for chest pain of concern was felt to be a hiatal hernia has had 2 endoscopies treated with omeprazole twice a day and now can stop because things are better.  Also is still in the middle of fertility evaluation trying to get pregnant. She sees Dr. cousins. Last period was a little bit late but otherwise normal about 3 weeks ago. She still tobacco free but 2 cups of caffeine rare 0 alcohol. She is on prenatal vitamins. She is still having recurrent headaches tried different medications including and nose spray that made her feel crazy. May need an another opinion about her head thinks that she would do better having an MRI despite neurologist saying that would not be indicated in insurance may not pay for no other neurologic symptoms. She is being followed in endocrinology by Dr. Everardo All but no medicines at this time.  ROS:  GEN/ HEENT: No fever, significant weight changes sweats headaches vision problems hearing changes, CV/ PULM; No chest pain shortness of breath cough, syncope,edema  change in exercise tolerance. GI /GU: No adominal pain, vomiting, change in bowel habits. No blood in the stool. No significant GU symptoms. SKIN/HEME: ,no acute skin rashes suspicious lesions or bleeding. No lymphadenopathy, nodules, masses.  NEURO/ PSYCH:  No neurologic signs such as weakness numbness. No depression anxiety. IMM/ Allergy: No unusual infections.  Allergy .   REST of 12 system review negative except as per HPI Additional family history members with PCO and endometriosis.  Past Medical History  Diagnosis Date  . GOITER, MULTINODULAR 04/18/2009  . THYROID STIMULATING HORMONE, ABNORMAL 01/10/2009  . Other anxiety states 01/30/2010  . TOBACCO USE 01/10/2008  . IRREGULAR MENSTRUATION 06/17/2007  . Headache(784.0) 06/17/2007   . TRANSAMINASES, SERUM, ELEVATED 01/10/2009    Family History  Problem Relation Age of Onset  . Pneumonia Father   . Alcohol abuse Father   . Colon polyps Mother   . Breast cancer Paternal Aunt   . Diabetes Maternal Grandfather   . Prostate cancer Paternal Grandfather   . Rectal cancer Neg Hx   . Stomach cancer Neg Hx   . Colon cancer Neg Hx     History   Social History  . Marital Status: Married    Spouse Name: N/A    Number of Children: N/A  . Years of Education: N/A   Occupational History  . 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 Smoking status: Former Smoker    Quit date: 12/29/2008  . Smokeless tobacco: Never Used  . Alcohol Use: Yes     Comment: occasional  . Drug Use: No  . Sexual Activity: Yes    Partners: Male   Other Topics Concern  . None   Social History Narrative   HH of 2   Pet dog   Bu accounts credits and electrical [employer]          Outpatient Encounter Prescriptions as of 08/26/2013  Medication Sig Dispense Refill  . acetaminophen (TYLENOL) 500 MG tablet Take 500 mg by mouth every 6 (six) hours as needed.      Marland Kitchen aspirin-acetaminophen-caffeine (EXCEDRIN MIGRAINE) 250-250-65 MG per tablet Take 2 tablets by mouth every 6 (six) hours as needed. For pain      . omeprazole (  PRILOSEC) 20 MG capsule Take 1 capsule (20 mg total) by mouth 2 (two) times daily.  90 capsule  3  . Prenatal Vit-Fe Fumarate-FA (PRENATAL MULTIVITAMIN) TABS Take 1 tablet by mouth daily.      . [DISCONTINUED] amoxicillin (AMOXIL) 500 MG capsule Take 1 capsule (500 mg total) by mouth 3 (three) times daily.  21 capsule  0  . [DISCONTINUED] benzonatate (TESSALON) 100 MG capsule Take 1 capsule (100 mg total) by mouth every 8 (eight) hours.  21 capsule  0  . [DISCONTINUED] fexofenadine-pseudoephedrine (ALLEGRA-D) 60-120 MG per tablet Take 1 tablet by mouth every 12 (twelve) hours.  30 tablet  0  . [DISCONTINUED] fluticasone (FLONASE) 50 MCG/ACT nasal spray  Place 2 sprays into the nose daily.  16 g  0  . [DISCONTINUED] ibuprofen (ADVIL,MOTRIN) 600 MG tablet Take 1 tablet (600 mg total) by mouth every 8 (eight) hours as needed for pain.  20 tablet  0  . [DISCONTINUED] predniSONE (DELTASONE) 20 MG tablet 2 tabs po daily for 5 days  10 tablet  0   No facility-administered encounter medications on file as of 08/26/2013.    EXAM:  BP 124/72  Pulse 79  Temp(Src) 98.1 F (36.7 C) (Oral)  Wt 165 lb (74.844 kg)  BMI 28.31 kg/m2  SpO2 98%  LMP 08/14/2013  Body mass index is 28.31 kg/(m^2).  Physical Exam: Vital signs reviewed [id number] is a well-developed well-nourished alert cooperative   female who appears her stated age in no acute distress.  HEENT: normocephalic atraumatic , Eyes: PERRL EOM's full, conjunctiva clear, Nares: paten,t no deformity discharge or tenderness., Ears: no deformity EAC's clear TMs with normal landmarks. Mouth: clear OP, no lesions, edema.  Moist mucous membranes. Dentition in adequate repair. NECK: supple without masses, palpable thyromegaly no  bruits. CHEST/PULM:  Clear to auscultation and percussion breath sounds equal no wheeze , rales or rhonchi. No chest wall deformities or tenderness. Breast: normal by inspection . No dimpling, discharge, masses, tenderness or discharge . CV: PMI is nondisplaced, S1 S2 no gallops, murmurs, rubs. Peripheral pulses are full without delay.No JVD .  ABDOMEN: Bowel sounds normal nontender  No guard or rebound, no hepato splenomegal no CVA tenderness.  No hernia. Extremtities:  No clubbing cyanosis or edema, no acute joint swelling or redness no focal atrophy NEURO:  Oriented x3, cranial nerves 3-12 appear to be intact, no obvious focal weakness,gait within normal limits no abnormal reflexes or asymmetrical SKIN: No acute rashes normal turgor, color, no bruising or petechiae. PSYCH: Oriented, good eye contact, no obvious depression anxiety, cognition and judgment appear normal. No  tremor LN: no cervical axillary inguinal adenopathy Pelvic: NL ext GU, labia clear without lesions or rash . Vagina no lesions .Cervix: clear  UTERUS: Neg CMT Adnexa:  clear no masses . PAP done   Lab Results  Component Value Date   WBC 4.0* 08/19/2013   HGB 12.7 08/19/2013   HCT 38.0 08/19/2013   PLT 313.0 08/19/2013   GLUCOSE 77 08/19/2013   CHOL 144 08/19/2013   TRIG 50.0 08/19/2013   HDL 49.50 08/19/2013   LDLCALC 85 08/19/2013   ALT 28 08/19/2013   AST 23 08/19/2013   NA 139 08/19/2013   K 3.7 08/19/2013   CL 107 08/19/2013   CREATININE 0.6 08/19/2013   BUN 9 08/19/2013   CO2 30 08/19/2013   TSH 0.04* 08/26/2013    ASSESSMENT AND PLAN:  Discussed the following assessment and plan:  Preventative health care - Plan:  PAP [[place]]  Need for prophylactic vaccination and inoculation against influenza - Plan: Flu Vaccine QUAD 36+ mos PF IM (Fluarix)  THYROID STIMULATING HORMONE, ABNORMAL - Plan: TSH, T4, free, T3, free  HEADACHE - had emotional se of nose spray.   GERD (gastroesophageal reflux disease)  GOITER, MULTINODULAR  Encounter for routine gynecological examination - Plan: PAP [[place]]  Routine general medical examination at a health care facility - Plan: PAP [[place]]  fertility problem - Under evaluation incomplete  Discussed above. Repeat thyroid tests today and recommend she see Dr. Everardo All she could be hyperthyroid multinodular goiter needs followup. She certainly can get a second opinion about the headaches they sound episodic and no obvious indication for imaging at this time. Reminded her that armamentarium is limited for both of these things if she is trying to get pregnant.  Discussed weaning omeprazole to avoid rebound acid and symptoms and when she takes medication for her headaches to take one for acid protection.  Pap smear done today patient doesn't feel comfortable waiting 5 years in between paps Patient Care Team: Madelin Headings, MD as PCP -  General Romero Belling, MD (Endocrinology) Rene Kocher, MD (Neurology) Gerhard Munch, MD (Psychiatry) Patient Instructions  Will notify you  of labs when available. And have you make appt with Dr Everardo All about the abnormal Thyroid test. Wean the omeprazole and take some when you have to take headache medication that could flare up your  Hernia. Make sure you are on folic acid   Will send copy of pap results to dr Cherly Hensen when available,   Preventive Care for Adults, Female A healthy lifestyle and preventive care can promote health and wellness. Preventive health guidelines for women include the following key practices.  A routine yearly physical is a good way to check with your caregiver about your health and preventive screening. It is a chance to share any concerns and updates on your health, and to receive a thorough exam.  Visit your dentist for a routine exam and preventive care every 6 months. Brush your teeth twice a day and floss once a day. Good oral hygiene prevents tooth decay and gum disease.  The frequency of eye exams is based on your age, health, family medical history, use of contact lenses, and other factors. Follow your caregiver's recommendations for frequency of eye exams.  Eat a healthy diet. Foods like vegetables, fruits, whole grains, low-fat dairy products, and lean protein foods contain the nutrients you need without too many calories. Decrease your intake of foods high in solid fats, added sugars, and salt. Eat the right amount of calories for you.Get information about a proper diet from your caregiver, if necessary.  Regular physical exercise is one of the most important things you can do for your health. Most adults should get at least 150 minutes of moderate-intensity exercise (any activity that increases your heart rate and causes you to sweat) each week. In addition, most adults need muscle-strengthening exercises on 2 or more days a week.  Maintain a  healthy weight. The body mass index (BMI) is a screening tool to identify possible weight problems. It provides an estimate of body fat based on height and weight. Your caregiver can help determine your BMI, and can help you achieve or maintain a healthy weight.For adults 20 years and older:  A BMI below 18.5 is considered underweight.  A BMI of 18.5 to 24.9 is normal.  A BMI of 25 to 29.9 is  considered overweight.  A BMI of 30 and above is considered obese.  Maintain normal blood lipids and cholesterol levels by exercising and minimizing your intake of saturated fat. Eat a balanced diet with plenty of fruit and vegetables. Blood tests for lipids and cholesterol should begin at age 68 and be repeated every 5 years. If your lipid or cholesterol levels are high, you are over 50, or you are at high risk for heart disease, you may need your cholesterol levels checked more frequently.Ongoing high lipid and cholesterol levels should be treated with medicines if diet and exercise are not effective.  If you smoke, find out from your caregiver how to quit. If you do not use tobacco, do not start.  If you are pregnant, do not drink alcohol. If you are breastfeeding, be very cautious about drinking alcohol. If you are not pregnant and choose to drink alcohol, do not exceed 1 drink per day. One drink is considered to be 12 ounces (355 mL) of beer, 5 ounces (148 mL) of wine, or 1.5 ounces (44 mL) of liquor.  Avoid use of street drugs. Do not share needles with anyone. Ask for help if you need support or instructions about stopping the use of drugs.  High blood pressure causes heart disease and increases the risk of stroke. Your blood pressure should be checked at least every 1 to 2 years. Ongoing high blood pressure should be treated with medicines if weight loss and exercise are not effective.  If you are 56 to 38 years old, ask your caregiver if you should take aspirin to prevent strokes.  Diabetes  screening involves taking a blood sample to check your fasting blood sugar level. This should be done once every 3 years, after age 62, if you are within normal weight and without risk factors for diabetes. Testing should be considered at a younger age or be carried out more frequently if you are overweight and have at least 1 risk factor for diabetes.  Breast cancer screening is essential preventive care for women. You should practice "breast self-awareness." This means understanding the normal appearance and feel of your breasts and may include breast self-examination. Any changes detected, no matter how small, should be reported to a caregiver. Women in their 19s and 30s should have a clinical breast exam (CBE) by a caregiver as part of a regular health exam every 1 to 3 years. After age 93, women should have a CBE every year. Starting at age 62, women should consider having a mammography (breast X-ray test) every year. Women who have a family history of breast cancer should talk to their caregiver about genetic screening. Women at a high risk of breast cancer should talk to their caregivers about having magnetic resonance imaging (MRI) and a mammography every year.  The Pap test is a screening test for cervical cancer. A Pap test can show cell changes on the cervix that might become cervical cancer if left untreated. A Pap test is a procedure in which cells are obtained and examined from the lower end of the uterus (cervix).  Women should have a Pap test starting at age 34.  Between ages 57 and 39, Pap tests should be repeated every 2 years.  Beginning at age 66, you should have a Pap test every 3 years as long as the past 3 Pap tests have been normal.  Some women have medical problems that increase the chance of getting cervical cancer. Talk to your caregiver about these problems.  It is especially important to talk to your caregiver if a new problem develops soon after your last Pap test. In these  cases, your caregiver may recommend more frequent screening and Pap tests.  The above recommendations are the same for women who have or have not gotten the vaccine for human papillomavirus (HPV).  If you had a hysterectomy for a problem that was not cancer or a condition that could lead to cancer, then you no longer need Pap tests. Even if you no longer need a Pap test, a regular exam is a good idea to make sure no other problems are starting.  If you are between ages 41 and 57, and you have had normal Pap tests going back 10 years, you no longer need Pap tests. Even if you no longer need a Pap test, a regular exam is a good idea to make sure no other problems are starting.  If you have had past treatment for cervical cancer or a condition that could lead to cancer, you need Pap tests and screening for cancer for at least 20 years after your treatment.  If Pap tests have been discontinued, risk factors (such as a new sexual partner) need to be reassessed to determine if screening should be resumed.  The HPV test is an additional test that may be used for cervical cancer screening. The HPV test looks for the virus that can cause the cell changes on the cervix. The cells collected during the Pap test can be tested for HPV. The HPV test could be used to screen women aged 45 years and older, and should be used in women of any age who have unclear Pap test results. After the age of 68, women should have HPV testing at the same frequency as a Pap test.  Colorectal cancer can be detected and often prevented. Most routine colorectal cancer screening begins at the age of 44 and continues through age 26. However, your caregiver may recommend screening at an earlier age if you have risk factors for colon cancer. On a yearly basis, your caregiver may provide home test kits to check for hidden blood in the stool. Use of a small camera at the end of a tube, to directly examine the colon (sigmoidoscopy or  colonoscopy), can detect the earliest forms of colorectal cancer. Talk to your caregiver about this at age 36, when routine screening begins. Direct examination of the colon should be repeated every 5 to 10 years through age 48, unless early forms of pre-cancerous polyps or small growths are found.  Hepatitis C blood testing is recommended for all people born from [birth year] through 1965 and any individual with known risks for hepatitis C.  Practice safe sex. Use condoms and avoid high-risk sexual practices to reduce the spread of sexually transmitted infections (STIs). STIs include gonorrhea, chlamydia, syphilis, trichomonas, herpes, HPV, and human immunodeficiency virus (HIV). Herpes, HIV, and HPV are viral illnesses that have no cure. They can result in disability, cancer, and death. Sexually active women aged 59 and younger should be checked for chlamydia. Older women with new or multiple partners should also be tested for chlamydia. Testing for other STIs is recommended if you are sexually active and at increased risk.  Osteoporosis is a disease in which the bones lose minerals and strength with aging. This can result in serious bone fractures. The risk of osteoporosis can be identified using a bone density scan. Women ages 11 and over and women at risk for fractures or  osteoporosis should discuss screening with their caregivers. Ask your caregiver whether you should take a calcium supplement or vitamin D to reduce the rate of osteoporosis.  Menopause can be associated with physical symptoms and risks. Hormone replacement therapy is available to decrease symptoms and risks. You should talk to your caregiver about whether hormone replacement therapy is right for you.  Use sunscreen with sun protection factor (SPF) of 30 or more. Apply sunscreen liberally and repeatedly throughout the day. You should seek shade when your shadow is shorter than you. Protect yourself by wearing long sleeves, pants, a  wide-brimmed hat, and sunglasses year round, whenever you are outdoors.  Once a month, do a whole body skin exam, using a mirror to look at the skin on your back. Notify your caregiver of new moles, moles that have irregular borders, moles that are larger than a pencil eraser, or moles that have changed in shape or color.  Stay current with required immunizations.  Influenza. You need a dose every fall (or winter). The composition of the flu vaccine changes each year, so being vaccinated once is not enough.  Pneumococcal polysaccharide. You need 1 to 2 doses if you smoke cigarettes or if you have certain chronic medical conditions. You need 1 dose at age 59 (or older) if you have never been vaccinated.  Tetanus, diphtheria, pertussis (Tdap, Td). Get 1 dose of Tdap vaccine if you are younger than age 24, are over 94 and have contact with an infant, are a Research scientist (physical sciences), are pregnant, or simply want to be protected from whooping cough. After that, you need a Td booster dose every 10 years. Consult your caregiver if you have not had at least 3 tetanus and diphtheria-containing shots sometime in your life or have a deep or dirty wound.  HPV. You need this vaccine if you are a woman age 73 or younger. The vaccine is given in 3 doses over 6 months.  Measles, mumps, rubella (MMR). You need at least 1 dose of MMR if you were born in 1957 or later. You may also need a second dose.  Meningococcal. If you are age 29 to 46 and a first-year college student living in a residence hall, or have one of several medical conditions, you need to get vaccinated against meningococcal disease. You may also need additional booster doses.  Zoster (shingles). If you are age 4 or older, you should get this vaccine.  Varicella (chickenpox). If you have never had chickenpox or you were vaccinated but received only 1 dose, talk to your caregiver to find out if you need this vaccine.  Hepatitis A. You need this vaccine if  you have a specific risk factor for hepatitis A virus infection or you simply wish to be protected from this disease. The vaccine is usually given as 2 doses, 6 to 18 months apart.  Hepatitis B. You need this vaccine if you have a specific risk factor for hepatitis B virus infection or you simply wish to be protected from this disease. The vaccine is given in 3 doses, usually over 6 months. Preventive Services / Frequency Ages 16 to 35  Blood pressure check.** / Every 1 to 2 years.  Lipid and cholesterol check.** / Every 5 years beginning at age 96.  Clinical breast exam.** / Every 3 years for women in their 10s and 30s.  Pap test.** / Every 2 years from ages 83 through 22. Every 3 years starting at age 47 through age 88 or 87  with a history of 3 consecutive normal Pap tests.  HPV screening.** / Every 3 years from ages 54 through ages 59 to 43 with a history of 3 consecutive normal Pap tests.  Hepatitis C blood test.** / For any individual with known risks for hepatitis C.  Skin self-exam. / Monthly.  Influenza immunization.** / Every year.  Pneumococcal polysaccharide immunization.** / 1 to 2 doses if you smoke cigarettes or if you have certain chronic medical conditions.  Tetanus, diphtheria, pertussis (Tdap, Td) immunization. / A one-time dose of Tdap vaccine. After that, you need a Td booster dose every 10 years.  HPV immunization. / 3 doses over 6 months, if you are 75 and younger.  Measles, mumps, rubella (MMR) immunization. / You need at least 1 dose of MMR if you were born in 1957 or later. You may also need a second dose.  Meningococcal immunization. / 1 dose if you are age 23 to 48 and a first-year college student living in a residence hall, or have one of several medical conditions, you need to get vaccinated against meningococcal disease. You may also need additional booster doses.  Varicella immunization.** / Consult your caregiver.  Hepatitis A immunization.** /  Consult your caregiver. 2 doses, 6 to 18 months apart.  Hepatitis B immunization.** / Consult your caregiver. 3 doses usually over 6 months. Ages 35 to 83  Blood pressure check.** / Every 1 to 2 years.  Lipid and cholesterol check.** / Every 5 years beginning at age 46.  Clinical breast exam.** / Every year after age 87.  Mammogram.** / Every year beginning at age 70 and continuing for as long as you are in good health. Consult with your caregiver.  Pap test.** / Every 3 years starting at age 39 through age 76 or 72 with a history of 3 consecutive normal Pap tests.  HPV screening.** / Every 3 years from ages 19 through ages 64 to 80 with a history of 3 consecutive normal Pap tests.  Fecal occult blood test (FOBT) of stool. / Every year beginning at age 35 and continuing until age 32. You may not need to do this test if you get a colonoscopy every 10 years.  Flexible sigmoidoscopy or colonoscopy.** / Every 5 years for a flexible sigmoidoscopy or every 10 years for a colonoscopy beginning at age 36 and continuing until age 47.  Hepatitis C blood test.** / For all people born from [birth year] through 1966 and any individual with known risks for hepatitis C.  Skin self-exam. / Monthly.  Influenza immunization.** / Every year.  Pneumococcal polysaccharide immunization.** / 1 to 2 doses if you smoke cigarettes or if you have certain chronic medical conditions.  Tetanus, diphtheria, pertussis (Tdap, Td) immunization.** / A one-time dose of Tdap vaccine. After that, you need a Td booster dose every 10 years.  Measles, mumps, rubella (MMR) immunization. / You need at least 1 dose of MMR if you were born in 1957 or later. You may also need a second dose.  Varicella immunization.** / Consult your caregiver.  Meningococcal immunization.** / Consult your caregiver.  Hepatitis A immunization.** / Consult your caregiver. 2 doses, 6 to 18 months apart.  Hepatitis B immunization.** / Consult your  caregiver. 3 doses, usually over 6 months. Ages 75 and over  Blood pressure check.** / Every 1 to 2 years.  Lipid and cholesterol check.** / Every 5 years beginning at age 80.  Clinical breast exam.** / Every year after age 35.  Mammogram.** /  Every year beginning at age 76 and continuing for as long as you are in good health. Consult with your caregiver.  Pap test.** / Every 3 years starting at age 12 through age 67 or 30 with a 3 consecutive normal Pap tests. Testing can be stopped between 65 and 70 with 3 consecutive normal Pap tests and no abnormal Pap or HPV tests in the past 10 years.  HPV screening.** / Every 3 years from ages 50 through ages 10 or 39 with a history of 3 consecutive normal Pap tests. Testing can be stopped between 65 and 70 with 3 consecutive normal Pap tests and no abnormal Pap or HPV tests in the past 10 years.  Fecal occult blood test (FOBT) of stool. / Every year beginning at age 12 and continuing until age 66. You may not need to do this test if you get a colonoscopy every 10 years.  Flexible sigmoidoscopy or colonoscopy.** / Every 5 years for a flexible sigmoidoscopy or every 10 years for a colonoscopy beginning at age 4 and continuing until age 59.  Hepatitis C blood test.** / For all people born from [birth year] through 1966 and any individual with known risks for hepatitis C.  Osteoporosis screening.** / A one-time screening for women ages 63 and over and women at risk for fractures or osteoporosis.  Skin self-exam. / Monthly.  Influenza immunization.** / Every year.  Pneumococcal polysaccharide immunization.** / 1 dose at age 50 (or older) if you have never been vaccinated.  Tetanus, diphtheria, pertussis (Tdap, Td) immunization. / A one-time dose of Tdap vaccine if you are over 65 and have contact with an infant, are a Research scientist (physical sciences), or simply want to be protected from whooping cough. After that, you need a Td booster dose every 10 years.  Varicella  immunization.** / Consult your caregiver.  Meningococcal immunization.** / Consult your caregiver.  Hepatitis A immunization.** / Consult your caregiver. 2 doses, 6 to 18 months apart.  Hepatitis B immunization.** / Check with your caregiver. 3 doses, usually over 6 months. ** Family history and personal history of risk and conditions may change your caregiver's recommendations. Document Released: 02/10/2002 Document Revised: 03/08/2012 Document Reviewed: 05/12/2011 [facility] Patient Information 2014 [place], [place].     Neta Mends. Panosh M.D.  Health Maintenance  Topic Date Due  . Pap Smear  02/28/2014  . Influenza Vaccine  07/29/2014  . Tetanus/tdap  12/04/2018   Health Maintenance Review

## 2013-09-16 ENCOUNTER — Encounter: Payer: Self-pay | Admitting: Endocrinology

## 2013-09-16 ENCOUNTER — Ambulatory Visit (INDEPENDENT_AMBULATORY_CARE_PROVIDER_SITE_OTHER): Payer: Self-pay | Admitting: Endocrinology

## 2013-09-16 VITALS — BP 124/70 | HR 75 | Ht 64.0 in | Wt 163.0 lb

## 2013-09-16 DIAGNOSIS — E042 Nontoxic multinodular goiter: Secondary | ICD-10-CM

## 2013-09-16 DIAGNOSIS — E0590 Thyrotoxicosis, unspecified without thyrotoxic crisis or storm: Secondary | ICD-10-CM

## 2013-09-16 MED ORDER — METHIMAZOLE 10 MG PO TABS: 10.0000 mg | ORAL_TABLET | Freq: Every day | ORAL | Status: DC

## 2013-09-16 NOTE — Progress Notes (Signed)
Subjective:    Patient ID: Crystal Mcintyre, female    DOB: Mar 28, 1976, 37 y.o.   MRN: [id number]  HPI Pt returns for f/u of small multinodular goiter, first found in 2010. Nodules were not big enough to need bx. pt states she feels well in general, except for fatigue and weight gain.  She also has menses lasting longer than usual. She does not notice the goiter.  She would like to start a pregnancy soon. She was recently noted by dr Fabian Sharp to have a suppressed TSH.  She has slight tremor of the hands, and assoc anxiety Past Medical History  Diagnosis Date  . GOITER, MULTINODULAR 04/18/2009  . THYROID STIMULATING HORMONE, ABNORMAL 01/10/2009  . Other anxiety states 01/30/2010  . TOBACCO USE 01/10/2008  . IRREGULAR MENSTRUATION 06/17/2007  . Headache(784.0) 06/17/2007  . TRANSAMINASES, SERUM, ELEVATED 01/10/2009    Past Surgical History  Procedure Laterality Date  . Tympanostomy tube placement  as child  . Tonsillectomy  as child  . Tympanoplasty  4 yrs ago  . Upper gastrointestinal endoscopy      History   Social History  . Marital Status: Married    Spouse Name: N/A    Number of Children: N/A  . Years of Education: N/A   Occupational History  . 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 Smoking status: Former Smoker    Quit date: 12/29/2008  . Smokeless tobacco: Never Used  . Alcohol Use: Yes     Comment: occasional  . Drug Use: No  . Sexual Activity: Yes    Partners: Male   Other Topics Concern  . 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          Current Outpatient Prescriptions on File Prior to Visit  Medication Sig Dispense Refill  . acetaminophen (TYLENOL) 500 MG tablet Take 500 mg by mouth every 6 (six) hours as needed.      Marland Kitchen aspirin-acetaminophen-caffeine (EXCEDRIN MIGRAINE) 250-250-65 MG per tablet Take 2 tablets by mouth every 6 (six) hours as needed. For pain      .  omeprazole (PRILOSEC) 20 MG capsule Take 1 capsule (20 mg total) by mouth 2 (two) times daily.  90 capsule  3  . Prenatal Vit-Fe Fumarate-FA (PRENATAL MULTIVITAMIN) TABS Take 1 tablet by mouth daily.       No current facility-administered medications on file prior to visit.   No Known Allergies  Family History  Problem Relation Age of Onset  . Pneumonia Father   . Alcohol abuse Father   . Colon polyps Mother   . Breast cancer Paternal Aunt   . Diabetes Maternal Grandfather   . Prostate cancer Paternal Grandfather   . Rectal cancer Neg Hx   . Stomach cancer Neg Hx   . Colon cancer Neg Hx    BP 124/70  Pulse 75  Ht 5\' 4"  (1.626 m)  Wt 163 lb (73.936 kg)  BMI 27.97 kg/m2  SpO2 97%  LMP 08/14/2013  Review of Systems Denies fever, but she has excessive diaphoresis.      Objective:   Physical Exam VITAL SIGNS:  See vs page GENERAL: no distress Neck: thyroid is 2x normal size on the right lobe, and normal on the left. There is a possible palpable nodule on the right, but i can't be certain.    Lab Results  Component Value Date   TSH 0.04* 08/26/2013      Assessment & Plan:  Multinodular goiter, not evident on physical exam. Hyperparathyroidism, new, due to the goiter.  She chooses thionamide rx, as she wants to continue to pursue a pregnancy.   Anxiety, and other sxs, prob due to the hyperthyroidism.

## 2013-09-16 NOTE — Patient Instructions (Addendum)
i have sent a prescription to your pharmacy, for a pill to slow down the thyroid. if ever you have fever while taking methimazole, stop it and call us, because of the risk of a rare side-effect Please come back for a follow-up appointment in 1 month. Please call sooner if the pregnancy happens.

## 2013-09-17 DIAGNOSIS — E0590 Thyrotoxicosis, unspecified without thyrotoxic crisis or storm: Secondary | ICD-10-CM | POA: Insufficient documentation

## 2013-10-07 ENCOUNTER — Other Ambulatory Visit: Payer: Self-pay | Admitting: Internal Medicine

## 2013-10-31 ENCOUNTER — Ambulatory Visit (INDEPENDENT_AMBULATORY_CARE_PROVIDER_SITE_OTHER): Payer: 59 | Admitting: Endocrinology

## 2013-10-31 ENCOUNTER — Encounter: Payer: Self-pay | Admitting: Endocrinology

## 2013-10-31 VITALS — BP 118/78 | HR 80 | Temp 98.3°F | Resp 12 | Ht 64.0 in | Wt 166.1 lb

## 2013-10-31 DIAGNOSIS — E0590 Thyrotoxicosis, unspecified without thyrotoxic crisis or storm: Secondary | ICD-10-CM

## 2013-10-31 NOTE — Progress Notes (Signed)
Subjective:    Patient ID: Crystal Mcintyre, female    DOB: 02-16-1976, 37 y.o.   MRN: [id number]  HPI Pt returns for f/u of small multinodular goiter, first found in 2010. Nodules were not big enough to need bx. She does not notice the goiter.  She would like to start a pregnancy soon. In mid-2014, she was noted by dr Fabian Sharp to have a suppressed TSH.  Since on the tapazole, pt states she feels no different, and well in general.  She has regular menses.   Past Medical History  Diagnosis Date  . GOITER, MULTINODULAR 04/18/2009  . THYROID STIMULATING HORMONE, ABNORMAL 01/10/2009  . Other anxiety states 01/30/2010  . TOBACCO USE 01/10/2008  . IRREGULAR MENSTRUATION 06/17/2007  . Headache(784.0) 06/17/2007  . TRANSAMINASES, SERUM, ELEVATED 01/10/2009    Past Surgical History  Procedure Laterality Date  . Tympanostomy tube placement  as child  . Tonsillectomy  as child  . Tympanoplasty  4 yrs ago  . Upper gastrointestinal endoscopy      History   Social History  . Marital Status: Married    Spouse Name: N/A    Number of Children: N/A  . Years of Education: N/A   Occupational History  . 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 Smoking status: Former Smoker    Quit date: 12/29/2008  . Smokeless tobacco: Never Used  . Alcohol Use: Yes     Comment: occasional  . Drug Use: No  . Sexual Activity: Yes    Partners: Male   Other Topics Concern  . 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          Current Outpatient Prescriptions on File Prior to Visit  Medication Sig Dispense Refill  . acetaminophen (TYLENOL) 500 MG tablet Take 500 mg by mouth every 6 (six) hours as needed.      Marland Kitchen aspirin-acetaminophen-caffeine (EXCEDRIN MIGRAINE) 250-250-65 MG per tablet Take 2 tablets by mouth every 6 (six) hours as needed. For pain      . omeprazole (PRILOSEC) 20 MG capsule TAKE 1 CAPSULE TWICE A DAY  60  capsule  1  . Prenatal Vit-Fe Fumarate-FA (PRENATAL MULTIVITAMIN) TABS Take 1 tablet by mouth daily.       No current facility-administered medications on file prior to visit.   No Known Allergies  Family History  Problem Relation Age of Onset  . Pneumonia Father   . Alcohol abuse Father   . Colon polyps Mother   . Breast cancer Paternal Aunt   . Diabetes Maternal Grandfather   . Prostate cancer Paternal Grandfather   . Rectal cancer Neg Hx   . Stomach cancer Neg Hx   . Colon cancer Neg Hx    BP 118/78  Pulse 80  Temp(Src) 98.3 F (36.8 C)  Resp 12  Ht 5\' 4"  (1.626 m)  Wt 166 lb 1.6 oz (75.342 kg)  BMI 28.50 kg/m2  SpO2 97%  Review of Systems Denies fever.      Objective:   Physical Exam VITAL SIGNS:  See vs page GENERAL: no distress Neck: thyroid is 2x normal size on the right lobe, and normal on the left. There is a possible palpable nodule on the right, but i can't be certain.    Lab Results  Component Value Date   TSH 3.01 10/31/2013  Assessment & Plan:  Hyperthyroidism: well-controlled, so she can tolerate a reduction of the tapazole.   Multinodular goiter: clinically unchanged

## 2013-10-31 NOTE — Patient Instructions (Addendum)
if ever you have fever while taking methimazole, stop it and call us, because of the risk of a rare side-effect Please come back for a follow-up appointment in 1 month.   Please call sooner if the pregnancy happens.

## 2013-11-01 MED ORDER — METHIMAZOLE 5 MG PO TABS: 5.0000 mg | ORAL_TABLET | Freq: Every day | ORAL | Status: DC

## 2014-01-04 ENCOUNTER — Ambulatory Visit (INDEPENDENT_AMBULATORY_CARE_PROVIDER_SITE_OTHER): Payer: 59 | Admitting: Internal Medicine

## 2014-01-04 ENCOUNTER — Encounter: Payer: Self-pay | Admitting: Internal Medicine

## 2014-01-04 ENCOUNTER — Ambulatory Visit (INDEPENDENT_AMBULATORY_CARE_PROVIDER_SITE_OTHER)
Admission: RE | Admit: 2014-01-04 | Discharge: 2014-01-04 | Disposition: A | Discharge status: Home / Self Care | Payer: 59 | Source: Ambulatory Visit | Attending: Internal Medicine | Admitting: Internal Medicine

## 2014-01-04 VITALS — BP 106/80 | Temp 98.0°F | Wt 164.0 lb

## 2014-01-04 DIAGNOSIS — M545 Low back pain, unspecified: Secondary | ICD-10-CM

## 2014-01-04 DIAGNOSIS — S335XXA Sprain of ligaments of lumbar spine, initial encounter: Secondary | ICD-10-CM

## 2014-01-04 DIAGNOSIS — S39012A Strain of muscle, fascia and tendon of lower back, initial encounter: Secondary | ICD-10-CM

## 2014-01-04 NOTE — Patient Instructions (Addendum)
Get x ray of the back . Will let  You know results  Then if ok then plan Physical therapy.  And if still not better may have sports medicine or orthopedics see you.    Low Back Strain with Rehab A strain is an injury in which a tendon or muscle is torn. The muscles and tendons of the lower back are vulnerable to strains. However, these muscles and tendons are very strong and require a great force to be injured. Strains are classified into three categories. Grade 1 strains cause pain, but the tendon is not lengthened. Grade 2 strains include a lengthened ligament, due to the ligament being stretched or partially ruptured. With grade 2 strains there is still function, although the function may be decreased. Grade 3 strains involve a complete tear of the tendon or muscle, and function is usually impaired. SYMPTOMS   Pain in the lower back.  Pain that affects one side more than the other.  Pain that gets worse with movement and may be felt in the hip, buttocks, or back of the thigh.  Muscle spasms of the muscles in the back.  Swelling along the muscles of the back.  Loss of strength of the back muscles.  Crackling sound (crepitation) when the muscles are touched. CAUSES  Lower back strains occur when a force is placed on the muscles or tendons that is greater than they can handle. Common causes of injury include:  Prolonged overuse of the muscle-tendon units in the lower back, usually from incorrect posture.  A single violent injury or force applied to the back. RISK INCREASES WITH:  Sports that involve twisting forces on the spine or a lot of bending at the waist (football, rugby, weightlifting, bowling, golf, tennis, speed skating, racquetball, swimming, running, gymnastics, diving).  Poor strength and flexibility.  Failure to warm up properly before activity.  Family history of lower back pain or disk disorders.  Previous back injury or surgery (especially fusion).  Poor  posture with lifting, especially heavy objects.  Prolonged sitting, especially with poor posture. PREVENTION   Learn and use proper posture when sitting or lifting (maintain proper posture when sitting, lift using the knees and legs, not at the waist).  Warm up and stretch properly before activity.  Allow for adequate recovery between workouts.  Maintain physical fitness:  Strength, flexibility, and endurance.  Cardiovascular fitness. PROGNOSIS  If treated properly, lower back strains usually heal within 6 weeks. RELATED COMPLICATIONS   Recurring symptoms, resulting in a chronic problem.  Chronic inflammation, scarring, and partial muscle-tendon tear.  Delayed healing or resolution of symptoms.  Prolonged disability. TREATMENT  Treatment first involves the use of ice and medicine, to reduce pain and inflammation. The use of strengthening and stretching exercises may help reduce pain with activity. These exercises may be performed at home or with a therapist. Severe injuries may require referral to a therapist for further evaluation and treatment, such as ultrasound. Your caregiver may advise that you wear a back brace or corset, to help reduce pain and discomfort. Often, prolonged bed rest results in greater harm then benefit. Corticosteroid injections may be recommended. However, these should be reserved for the most serious cases. It is important to avoid using your back when lifting objects. At night, sleep on your back on a firm mattress with a pillow placed under your knees. If non-surgical treatment is unsuccessful, surgery may be needed.  MEDICATION   If pain medicine is needed, nonsteroidal anti-inflammatory medicines (aspirin and  ibuprofen), or other minor pain relievers (acetaminophen), are often advised.  Do not take pain medicine for 7 days before surgery.  Prescription pain relievers may be given, if your caregiver thinks they are needed. Use only as directed and only  as much as you need.  Ointments applied to the skin may be helpful.  Corticosteroid injections may be given by your caregiver. These injections should be reserved for the most serious cases, because they may only be given a certain number of times. HEAT AND COLD  Cold treatment (icing) should be applied for 10 to 15 minutes every 2 to 3 hours for inflammation and pain, and immediately after activity that aggravates your symptoms. Use ice packs or an ice massage.  Heat treatment may be used before performing stretching and strengthening activities prescribed by your caregiver, physical therapist, or athletic trainer. Use a heat pack or a warm water soak. SEEK MEDICAL CARE IF:   Symptoms get worse or do not improve in 2 to 4 weeks, despite treatment.  You develop numbness, weakness, or loss of bowel or bladder function.  New, unexplained symptoms develop. (Drugs used in treatment may produce side effects.) EXERCISES  RANGE OF MOTION (ROM) AND STRETCHING EXERCISES - Low Back Strain Most people with lower back pain will find that their symptoms get worse with excessive bending forward (flexion) or arching at the lower back (extension). The exercises which will help resolve your symptoms will focus on the opposite motion.  Your physician, physical therapist or athletic trainer will help you determine which exercises will be most helpful to resolve your lower back pain. Do not complete any exercises without first consulting with your caregiver. Discontinue any exercises which make your symptoms worse until you speak to your caregiver.  If you have pain, numbness or tingling which travels down into your buttocks, leg or foot, the goal of the therapy is for these symptoms to move closer to your back and eventually resolve. Sometimes, these leg symptoms will get better, but your lower back pain may worsen. This is typically an indication of progress in your rehabilitation. Be very alert to any changes in  your symptoms and the activities in which you participated in the 24 hours prior to the change. Sharing this information with your caregiver will allow him/her to most efficiently treat your condition.  These exercises may help you when beginning to rehabilitate your injury. Your symptoms may resolve with or without further involvement from your physician, physical therapist or athletic trainer. While completing these exercises, remember:  Restoring tissue flexibility helps normal motion to return to the joints. This allows healthier, less painful movement and activity.  An effective stretch should be held for at least 30 seconds.  A stretch should never be painful. You should only feel a gentle lengthening or release in the stretched tissue. FLEXION RANGE OF MOTION AND STRETCHING EXERCISES: STRETCH  Flexion, Single Knee to Chest   Lie on a firm bed or floor with both legs extended in front of you.  Keeping one leg in contact with the floor, bring your opposite knee to your chest. Hold your leg in place by either grabbing behind your thigh or at your knee.  Pull until you feel a gentle stretch in your lower back. Hold __________ seconds.  Slowly release your grasp and repeat the exercise with the opposite side. Repeat __________ times. Complete this exercise __________ times per day.  STRETCH  Flexion, Double Knee to Chest   Lie on a firm  bed or floor with both legs extended in front of you.  Keeping one leg in contact with the floor, bring your opposite knee to your chest.  Tense your stomach muscles to support your back and then lift your other knee to your chest. Hold your legs in place by either grabbing behind your thighs or at your knees.  Pull both knees toward your chest until you feel a gentle stretch in your lower back. Hold __________ seconds.  Tense your stomach muscles and slowly return one leg at a time to the floor. Repeat __________ times. Complete this exercise  __________ times per day.  STRETCH  Low Trunk Rotation  Lie on a firm bed or floor. Keeping your legs in front of you, bend your knees so they are both pointed toward the ceiling and your feet are flat on the floor.  Extend your arms out to the side. This will stabilize your upper body by keeping your shoulders in contact with the floor.  Gently and slowly drop both knees together to one side until you feel a gentle stretch in your lower back. Hold for __________ seconds.  Tense your stomach muscles to support your lower back as you bring your knees back to the starting position. Repeat the exercise to the other side. Repeat __________ times. Complete this exercise __________ times per day  EXTENSION RANGE OF MOTION AND FLEXIBILITY EXERCISES: STRETCH  Extension, Prone on Elbows   Lie on your stomach on the floor, a bed will be too soft. Place your palms about shoulder width apart and at the height of your head.  Place your elbows under your shoulders. If this is too painful, stack pillows under your chest.  Allow your body to relax so that your hips drop lower and make contact more completely with the floor.  Hold this position for __________ seconds.  Slowly return to lying flat on the floor. Repeat __________ times. Complete this exercise __________ times per day.  RANGE OF MOTION  Extension, Prone Press Ups  Lie on your stomach on the floor, a bed will be too soft. Place your palms about shoulder width apart and at the height of your head.  Keeping your back as relaxed as possible, slowly straighten your elbows while keeping your hips on the floor. You may adjust the placement of your hands to maximize your comfort. As you gain motion, your hands will come more underneath your shoulders.  Hold this position __________ seconds.  Slowly return to lying flat on the floor. Repeat __________ times. Complete this exercise __________ times per day.  RANGE OF MOTION- Quadruped, Neutral  Spine   Assume a hands and knees position on a firm surface. Keep your hands under your shoulders and your knees under your hips. You may place padding under your knees for comfort.  Drop your head and point your tail bone toward the ground below you. This will round out your lower back like an angry cat. Hold this position for __________ seconds.  Slowly lift your head and release your tail bone so that your back sags into a large arch, like an old horse.  Hold this position for __________ seconds.  Repeat this until you feel limber in your lower back.  Now, find your "sweet spot." This will be the most comfortable position somewhere between the two previous positions. This is your neutral spine. Once you have found this position, tense your stomach muscles to support your lower back.  Hold this position for  __________ seconds. Repeat __________ times. Complete this exercise __________ times per day.  STRENGTHENING EXERCISES - Low Back Strain These exercises may help you when beginning to rehabilitate your injury. These exercises should be done near your "sweet spot." This is the neutral, low-back arch, somewhere between fully rounded and fully arched, that is your least painful position. When performed in this safe range of motion, these exercises can be used for people who have either a flexion or extension based injury. These exercises may resolve your symptoms with or without further involvement from your physician, physical therapist or athletic trainer. While completing these exercises, remember:   Muscles can gain both the endurance and the strength needed for everyday activities through controlled exercises.  Complete these exercises as instructed by your physician, physical therapist or athletic trainer. Increase the resistance and repetitions only as guided.  You may experience muscle soreness or fatigue, but the pain or discomfort you are trying to eliminate should never worsen  during these exercises. If this pain does worsen, stop and make certain you are following the directions exactly. If the pain is still present after adjustments, discontinue the exercise until you can discuss the trouble with your caregiver. STRENGTHENING Deep Abdominals, Pelvic Tilt  Lie on a firm bed or floor. Keeping your legs in front of you, bend your knees so they are both pointed toward the ceiling and your feet are flat on the floor.  Tense your lower abdominal muscles to press your lower back into the floor. This motion will rotate your pelvis so that your tail bone is scooping upwards rather than pointing at your feet or into the floor.  With a gentle tension and even breathing, hold this position for __________ seconds. Repeat __________ times. Complete this exercise __________ times per day.  STRENGTHENING  Abdominals, Crunches   Lie on a firm bed or floor. Keeping your legs in front of you, bend your knees so they are both pointed toward the ceiling and your feet are flat on the floor. Cross your arms over your chest.  Slightly tip your chin down without bending your neck.  Tense your abdominals and slowly lift your trunk high enough to just clear your shoulder blades. Lifting higher can put excessive stress on the lower back and does not further strengthen your abdominal muscles.  Control your return to the starting position. Repeat __________ times. Complete this exercise __________ times per day.  STRENGTHENING  Quadruped, Opposite UE/LE Lift   Assume a hands and knees position on a firm surface. Keep your hands under your shoulders and your knees under your hips. You may place padding under your knees for comfort.  Find your neutral spine and gently tense your abdominal muscles so that you can maintain this position. Your shoulders and hips should form a rectangle that is parallel with the floor and is not twisted.  Keeping your trunk steady, lift your right hand no higher  than your shoulder and then your left leg no higher than your hip. Make sure you are not holding your breath. Hold this position __________ seconds.  Continuing to keep your abdominal muscles tense and your back steady, slowly return to your starting position. Repeat with the opposite arm and leg. Repeat __________ times. Complete this exercise __________ times per day.  STRENGTHENING  Lower Abdominals, Double Knee Lift  Lie on a firm bed or floor. Keeping your legs in front of you, bend your knees so they are both pointed toward the ceiling and  your feet are flat on the floor.  Tense your abdominal muscles to brace your lower back and slowly lift both of your knees until they come over your hips. Be certain not to hold your breath.  Hold __________ seconds. Using your abdominal muscles, return to the starting position in a slow and controlled manner. Repeat __________ times. Complete this exercise __________ times per day.  POSTURE AND BODY MECHANICS CONSIDERATIONS - Low Back Strain Keeping correct posture when sitting, standing or completing your activities will reduce the stress put on different body tissues, allowing injured tissues a chance to heal and limiting painful experiences. The following are general guidelines for improved posture. Your physician or physical therapist will provide you with any instructions specific to your needs. While reading these guidelines, remember:  The exercises prescribed by your provider will help you have the flexibility and strength to maintain correct postures.  The correct posture provides the best environment for your joints to work. All of your joints have less wear and tear when properly supported by a spine with good posture. This means you will experience a healthier, less painful body.  Correct posture must be practiced with all of your activities, especially prolonged sitting and standing. Correct posture is as important when doing repetitive  low-stress activities (typing) as it is when doing a single heavy-load activity (lifting). RESTING POSITIONS Consider which positions are most painful for you when choosing a resting position. If you have pain with flexion-based activities (sitting, bending, stooping, squatting), choose a position that allows you to rest in a less flexed posture. You would want to avoid curling into a fetal position on your side. If your pain worsens with extension-based activities (prolonged standing, working overhead), avoid resting in an extended position such as sleeping on your stomach. Most people will find more comfort when they rest with their spine in a more neutral position, neither too rounded nor too arched. Lying on a non-sagging bed on your side with a pillow between your knees, or on your back with a pillow under your knees will often provide some relief. Keep in mind, being in any one position for a prolonged period of time, no matter how correct your posture, can still lead to stiffness. PROPER SITTING POSTURE In order to minimize stress and discomfort on your spine, you must sit with correct posture. Sitting with good posture should be effortless for a healthy body. Returning to good posture is a gradual process. Many people can work toward this most comfortably by using various supports until they have the flexibility and strength to maintain this posture on their own. When sitting with proper posture, your ears will fall over your shoulders and your shoulders will fall over your hips. You should use the back of the chair to support your upper back. Your lower back will be in a neutral position, just slightly arched. You may place a small pillow or folded towel at the base of your lower back for support.  When working at a desk, create an environment that supports good, upright posture. Without extra support, muscles tire, which leads to excessive strain on joints and other tissues. Keep these  recommendations in mind: CHAIR:  A chair should be able to slide under your desk when your back makes contact with the back of the chair. This allows you to work closely.  The chair's height should allow your eyes to be level with the upper part of your monitor and your hands to be slightly lower  than your elbows. BODY POSITION  Your feet should make contact with the floor. If this is not possible, use a foot rest.  Keep your ears over your shoulders. This will reduce stress on your neck and lower back. INCORRECT SITTING POSTURES  If you are feeling tired and unable to assume a healthy sitting posture, do not slouch or slump. This puts excessive strain on your back tissues, causing more damage and pain. Healthier options include:  Using more support, like a lumbar pillow.  Switching tasks to something that requires you to be upright or walking.  Talking a brief walk.  Lying down to rest in a neutral-spine position. PROLONGED STANDING WHILE SLIGHTLY LEANING FORWARD  When completing a task that requires you to lean forward while standing in one place for a long time, place either foot up on a stationary 2-4 inch high object to help maintain the best posture. When both feet are on the ground, the lower back tends to lose its slight inward curve. If this curve flattens (or becomes too large), then the back and your other joints will experience too much stress, tire more quickly, and can cause pain. CORRECT STANDING POSTURES Proper standing posture should be assumed with all daily activities, even if they only take a few moments, like when brushing your teeth. As in sitting, your ears should fall over your shoulders and your shoulders should fall over your hips. You should keep a slight tension in your abdominal muscles to brace your spine. Your tailbone should point down to the ground, not behind your body, resulting in an over-extended swayback posture.  INCORRECT STANDING POSTURES  Common  incorrect standing postures include a forward head, locked knees and/or an excessive swayback. WALKING Walk with an upright posture. Your ears, shoulders and hips should all line-up. PROLONGED ACTIVITY IN A FLEXED POSITION When completing a task that requires you to bend forward at your waist or lean over a low surface, try to find a way to stabilize 3 out of 4 of your limbs. You can place a hand or elbow on your thigh or rest a knee on the surface you are reaching across. This will provide you more stability so that your muscles do not fatigue as quickly. By keeping your knees relaxed, or slightly bent, you will also reduce stress across your lower back. CORRECT LIFTING TECHNIQUES DO :   Assume a wide stance. This will provide you more stability and the opportunity to get as close as possible to the object which you are lifting.  Tense your abdominals to brace your spine. Bend at the knees and hips. Keeping your back locked in a neutral-spine position, lift using your leg muscles. Lift with your legs, keeping your back straight.  Test the weight of unknown objects before attempting to lift them.  Try to keep your elbows locked down at your sides in order get the best strength from your shoulders when carrying an object.  Always ask for help when lifting heavy or awkward objects. INCORRECT LIFTING TECHNIQUES DO NOT:   Lock your knees when lifting, even if it is a small object.  Bend and twist. Pivot at your feet or move your feet when needing to change directions.  Assume that you can safely pick up even a paper clip without proper posture. Document Released: 12/15/2005 Document Revised: 03/08/2012 Document Reviewed: 03/29/2009 [facility] Patient Information 2014 [place], [place].

## 2014-01-04 NOTE — Progress Notes (Signed)
Quick Note:  Tell patient that x ray shows no acute abnormality. Refer to PT i will pu in order . ______

## 2014-01-04 NOTE — Progress Notes (Signed)
Chief Complaint  Patient presents with  . Left lower back pain    Hurt her back a few months ago at work.    HPI: Patient comes in today for SDA for  new problem evaluation. See above  New job July and  In the fall moving furniture etc aound and when was moving  [place] had a pop and then  [employer] a loit.  Getting up and sore . Has used over-the-counter medicine not that much better pain when sitting and when getting up out of the chair new weakness and can walk okay. Avoiding lifting and bending. She comes in today because his is now better after a few months. No past history of similar problem. ROS: See pertinent positives and negatives per HPI. No current fever chest pain weakness radiation.  Past Medical History  Diagnosis Date  . GOITER, MULTINODULAR 04/18/2009  . THYROID STIMULATING HORMONE, ABNORMAL 01/10/2009  . Other anxiety states 01/30/2010  . TOBACCO USE 01/10/2008  . IRREGULAR MENSTRUATION 06/17/2007  . Headache(784.0) 06/17/2007  . TRANSAMINASES, SERUM, ELEVATED 01/10/2009    Family History  Problem Relation Age of Onset  . Pneumonia Father   . Alcohol abuse Father   . Colon polyps Mother   . Breast cancer Paternal Aunt   . Diabetes Maternal Grandfather   . Prostate cancer Paternal Grandfather   . Rectal cancer Neg Hx   . Stomach cancer Neg Hx   . Colon cancer Neg Hx     History   Social History  . Marital Status: Married    Spouse Name: N/A    Number of Children: N/A  . Years of Education: N/A   Occupational History  . Intake Coordinator at [employer] History Main Topics  . Smoking status: Former Smoker    Quit date: 12/29/2008  . Smokeless tobacco: Never Used  . Alcohol Use: Yes     Comment: occasional  . Drug Use: No  . Sexual Activity: Yes    Partners: Male   Other Topics Concern  . None   Social History Narrative   HH of 2   Pet dog   [employer]          Outpatient Encounter Prescriptions as of  01/04/2014  Medication Sig  . acetaminophen (TYLENOL) 500 MG tablet Take 500 mg by mouth every 6 (six) hours as needed.  Marland Kitchen aspirin-acetaminophen-caffeine (EXCEDRIN MIGRAINE) 250-250-65 MG per tablet Take 2 tablets by mouth every 6 (six) hours as needed. For pain  . methimazole (TAPAZOLE) 5 MG tablet Take 1 tablet (5 mg total) by mouth daily.  Marland Kitchen omeprazole (PRILOSEC) 20 MG capsule TAKE 1 CAPSULE TWICE A DAY  . Prenatal Vit-Fe Fumarate-FA (PRENATAL MULTIVITAMIN) TABS Take 1 tablet by mouth daily.    EXAM:  BP 106/80  Temp(Src) 98 F (36.7 C) (Oral)  Wt 164 lb (74.39 kg)  Body mass index is 28.14 kg/(m^2).  GENERAL: vitals reviewed and listed above, alert, oriented, appears well hydrated and in no acute distress Back no midline spine tender gait generally within normal limits some tenderness in the left lower spinal area no bony tenderness toe heel walk is normal some discomfort with sitting negative SLR Neurologic DTRs are intact no clonus. No weakness noted PSYCH: pleasant and cooperative,   ASSESSMENT AND PLAN:  Discussed the following assessment and plan:  Low back strain, initial encounter - left perssistent - Plan: DG Lumbar Spine Complete  Left low back pain -  Plan: DG Lumbar Spine Complete Because of symptoms greater than one to 2 months and persisting check plain back x-ray and then plan referral to physical therapy. Other interventions if needed discussed back hygiene avoid lifting. No other alarm features noted. -Patient advised to return or notify health care team  if symptoms worsen or persist or new concerns arise.  Patient Instructions  Get x ray of the back . Will let  You know results  Then if ok then plan Physical therapy.  And if still not better may have sports medicine or orthopedics see you.    Low Back Strain with Rehab A strain is an injury in which a tendon or muscle is torn. The muscles and tendons of the lower back are vulnerable to strains. However,  these muscles and tendons are very strong and require a great force to be injured. Strains are classified into three categories. Grade 1 strains cause pain, but the tendon is not lengthened. Grade 2 strains include a lengthened ligament, due to the ligament being stretched or partially ruptured. With grade 2 strains there is still function, although the function may be decreased. Grade 3 strains involve a complete tear of the tendon or muscle, and function is usually impaired. SYMPTOMS   Pain in the lower back.  Pain that affects one side more than the other.  Pain that gets worse with movement and may be felt in the hip, buttocks, or back of the thigh.  Muscle spasms of the muscles in the back.  Swelling along the muscles of the back.  Loss of strength of the back muscles.  Crackling sound (crepitation) when the muscles are touched. CAUSES  Lower back strains occur when a force is placed on the muscles or tendons that is greater than they can handle. Common causes of injury include:  Prolonged overuse of the muscle-tendon units in the lower back, usually from incorrect posture.  A single violent injury or force applied to the back. RISK INCREASES WITH:  Sports that involve twisting forces on the spine or a lot of bending at the waist (football, rugby, weightlifting, bowling, golf, tennis, speed skating, racquetball, swimming, running, gymnastics, diving).  Poor strength and flexibility.  Failure to warm up properly before activity.  Family history of lower back pain or disk disorders.  Previous back injury or surgery (especially fusion).  Poor posture with lifting, especially heavy objects.  Prolonged sitting, especially with poor posture. PREVENTION   Learn and use proper posture when sitting or lifting (maintain proper posture when sitting, lift using the knees and legs, not at the waist).  Warm up and stretch properly before activity.  Allow for adequate recovery  between workouts.  Maintain physical fitness:  Strength, flexibility, and endurance.  Cardiovascular fitness. PROGNOSIS  If treated properly, lower back strains usually heal within 6 weeks. RELATED COMPLICATIONS   Recurring symptoms, resulting in a chronic problem.  Chronic inflammation, scarring, and partial muscle-tendon tear.  Delayed healing or resolution of symptoms.  Prolonged disability. TREATMENT  Treatment first involves the use of ice and medicine, to reduce pain and inflammation. The use of strengthening and stretching exercises may help reduce pain with activity. These exercises may be performed at home or with a therapist. Severe injuries may require referral to a therapist for further evaluation and treatment, such as ultrasound. Your caregiver may advise that you wear a back brace or corset, to help reduce pain and discomfort. Often, prolonged bed rest results in greater harm then benefit. Corticosteroid  injections may be recommended. However, these should be reserved for the most serious cases. It is important to avoid using your back when lifting objects. At night, sleep on your back on a firm mattress with a pillow placed under your knees. If non-surgical treatment is unsuccessful, surgery may be needed.  MEDICATION   If pain medicine is needed, nonsteroidal anti-inflammatory medicines (aspirin and ibuprofen), or other minor pain relievers (acetaminophen), are often advised.  Do not take pain medicine for 7 days before surgery.  Prescription pain relievers may be given, if your caregiver thinks they are needed. Use only as directed and only as much as you need.  Ointments applied to the skin may be helpful.  Corticosteroid injections may be given by your caregiver. These injections should be reserved for the most serious cases, because they may only be given a certain number of times. HEAT AND COLD  Cold treatment (icing) should be applied for 10 to 15 minutes every  2 to 3 hours for inflammation and pain, and immediately after activity that aggravates your symptoms. Use ice packs or an ice massage.  Heat treatment may be used before performing stretching and strengthening activities prescribed by your caregiver, physical therapist, or athletic trainer. Use a heat pack or a warm water soak. SEEK MEDICAL CARE IF:   Symptoms get worse or do not improve in 2 to 4 weeks, despite treatment.  You develop numbness, weakness, or loss of bowel or bladder function.  New, unexplained symptoms develop. (Drugs used in treatment may produce side effects.) EXERCISES  RANGE OF MOTION (ROM) AND STRETCHING EXERCISES - Low Back Strain Most people with lower back pain will find that their symptoms get worse with excessive bending forward (flexion) or arching at the lower back (extension). The exercises which will help resolve your symptoms will focus on the opposite motion.  Your physician, physical therapist or athletic trainer will help you determine which exercises will be most helpful to resolve your lower back pain. Do not complete any exercises without first consulting with your caregiver. Discontinue any exercises which make your symptoms worse until you speak to your caregiver.  If you have pain, numbness or tingling which travels down into your buttocks, leg or foot, the goal of the therapy is for these symptoms to move closer to your back and eventually resolve. Sometimes, these leg symptoms will get better, but your lower back pain may worsen. This is typically an indication of progress in your rehabilitation. Be very alert to any changes in your symptoms and the activities in which you participated in the 24 hours prior to the change. Sharing this information with your caregiver will allow him/her to most efficiently treat your condition.  These exercises may help you when beginning to rehabilitate your injury. Your symptoms may resolve with or without further  involvement from your physician, physical therapist or athletic trainer. While completing these exercises, remember:  Restoring tissue flexibility helps normal motion to return to the joints. This allows healthier, less painful movement and activity.  An effective stretch should be held for at least 30 seconds.  A stretch should never be painful. You should only feel a gentle lengthening or release in the stretched tissue. FLEXION RANGE OF MOTION AND STRETCHING EXERCISES: STRETCH  Flexion, Single Knee to Chest   Lie on a firm bed or floor with both legs extended in front of you.  Keeping one leg in contact with the floor, bring your opposite knee to your chest. Hold your  leg in place by either grabbing behind your thigh or at your knee.  Pull until you feel a gentle stretch in your lower back. Hold __________ seconds.  Slowly release your grasp and repeat the exercise with the opposite side. Repeat __________ times. Complete this exercise __________ times per day.  STRETCH  Flexion, Double Knee to Chest   Lie on a firm bed or floor with both legs extended in front of you.  Keeping one leg in contact with the floor, bring your opposite knee to your chest.  Tense your stomach muscles to support your back and then lift your other knee to your chest. Hold your legs in place by either grabbing behind your thighs or at your knees.  Pull both knees toward your chest until you feel a gentle stretch in your lower back. Hold __________ seconds.  Tense your stomach muscles and slowly return one leg at a time to the floor. Repeat __________ times. Complete this exercise __________ times per day.  STRETCH  Low Trunk Rotation  Lie on a firm bed or floor. Keeping your legs in front of you, bend your knees so they are both pointed toward the ceiling and your feet are flat on the floor.  Extend your arms out to the side. This will stabilize your upper body by keeping your shoulders in contact with  the floor.  Gently and slowly drop both knees together to one side until you feel a gentle stretch in your lower back. Hold for __________ seconds.  Tense your stomach muscles to support your lower back as you bring your knees back to the starting position. Repeat the exercise to the other side. Repeat __________ times. Complete this exercise __________ times per day  EXTENSION RANGE OF MOTION AND FLEXIBILITY EXERCISES: STRETCH  Extension, Prone on Elbows   Lie on your stomach on the floor, a bed will be too soft. Place your palms about shoulder width apart and at the height of your head.  Place your elbows under your shoulders. If this is too painful, stack pillows under your chest.  Allow your body to relax so that your hips drop lower and make contact more completely with the floor.  Hold this position for __________ seconds.  Slowly return to lying flat on the floor. Repeat __________ times. Complete this exercise __________ times per day.  RANGE OF MOTION  Extension, Prone Press Ups  Lie on your stomach on the floor, a bed will be too soft. Place your palms about shoulder width apart and at the height of your head.  Keeping your back as relaxed as possible, slowly straighten your elbows while keeping your hips on the floor. You may adjust the placement of your hands to maximize your comfort. As you gain motion, your hands will come more underneath your shoulders.  Hold this position __________ seconds.  Slowly return to lying flat on the floor. Repeat __________ times. Complete this exercise __________ times per day.  RANGE OF MOTION- Quadruped, Neutral Spine   Assume a hands and knees position on a firm surface. Keep your hands under your shoulders and your knees under your hips. You may place padding under your knees for comfort.  Drop your head and point your tail bone toward the ground below you. This will round out your lower back like an angry cat. Hold this position for  __________ seconds.  Slowly lift your head and release your tail bone so that your back sags into a large arch, like  an old horse.  Hold this position for __________ seconds.  Repeat this until you feel limber in your lower back.  Now, find your "sweet spot." This will be the most comfortable position somewhere between the two previous positions. This is your neutral spine. Once you have found this position, tense your stomach muscles to support your lower back.  Hold this position for __________ seconds. Repeat __________ times. Complete this exercise __________ times per day.  STRENGTHENING EXERCISES - Low Back Strain These exercises may help you when beginning to rehabilitate your injury. These exercises should be done near your "sweet spot." This is the neutral, low-back arch, somewhere between fully rounded and fully arched, that is your least painful position. When performed in this safe range of motion, these exercises can be used for people who have either a flexion or extension based injury. These exercises may resolve your symptoms with or without further involvement from your physician, physical therapist or athletic trainer. While completing these exercises, remember:   Muscles can gain both the endurance and the strength needed for everyday activities through controlled exercises.  Complete these exercises as instructed by your physician, physical therapist or athletic trainer. Increase the resistance and repetitions only as guided.  You may experience muscle soreness or fatigue, but the pain or discomfort you are trying to eliminate should never worsen during these exercises. If this pain does worsen, stop and make certain you are following the directions exactly. If the pain is still present after adjustments, discontinue the exercise until you can discuss the trouble with your caregiver. STRENGTHENING Deep Abdominals, Pelvic Tilt  Lie on a firm bed or floor. Keeping your legs in  front of you, bend your knees so they are both pointed toward the ceiling and your feet are flat on the floor.  Tense your lower abdominal muscles to press your lower back into the floor. This motion will rotate your pelvis so that your tail bone is scooping upwards rather than pointing at your feet or into the floor.  With a gentle tension and even breathing, hold this position for __________ seconds. Repeat __________ times. Complete this exercise __________ times per day.  STRENGTHENING  Abdominals, Crunches   Lie on a firm bed or floor. Keeping your legs in front of you, bend your knees so they are both pointed toward the ceiling and your feet are flat on the floor. Cross your arms over your chest.  Slightly tip your chin down without bending your neck.  Tense your abdominals and slowly lift your trunk high enough to just clear your shoulder blades. Lifting higher can put excessive stress on the lower back and does not further strengthen your abdominal muscles.  Control your return to the starting position. Repeat __________ times. Complete this exercise __________ times per day.  STRENGTHENING  Quadruped, Opposite UE/LE Lift   Assume a hands and knees position on a firm surface. Keep your hands under your shoulders and your knees under your hips. You may place padding under your knees for comfort.  Find your neutral spine and gently tense your abdominal muscles so that you can maintain this position. Your shoulders and hips should form a rectangle that is parallel with the floor and is not twisted.  Keeping your trunk steady, lift your right hand no higher than your shoulder and then your left leg no higher than your hip. Make sure you are not holding your breath. Hold this position __________ seconds.  Continuing to keep your abdominal  muscles tense and your back steady, slowly return to your starting position. Repeat with the opposite arm and leg. Repeat __________ times. Complete  this exercise __________ times per day.  STRENGTHENING  Lower Abdominals, Double Knee Lift  Lie on a firm bed or floor. Keeping your legs in front of you, bend your knees so they are both pointed toward the ceiling and your feet are flat on the floor.  Tense your abdominal muscles to brace your lower back and slowly lift both of your knees until they come over your hips. Be certain not to hold your breath.  Hold __________ seconds. Using your abdominal muscles, return to the starting position in a slow and controlled manner. Repeat __________ times. Complete this exercise __________ times per day.  POSTURE AND BODY MECHANICS CONSIDERATIONS - Low Back Strain Keeping correct posture when sitting, standing or completing your activities will reduce the stress put on different body tissues, allowing injured tissues a chance to heal and limiting painful experiences. The following are general guidelines for improved posture. Your physician or physical therapist will provide you with any instructions specific to your needs. While reading these guidelines, remember:  The exercises prescribed by your provider will help you have the flexibility and strength to maintain correct postures.  The correct posture provides the best environment for your joints to work. All of your joints have less wear and tear when properly supported by a spine with good posture. This means you will experience a healthier, less painful body.  Correct posture must be practiced with all of your activities, especially prolonged sitting and standing. Correct posture is as important when doing repetitive low-stress activities (typing) as it is when doing a single heavy-load activity (lifting). RESTING POSITIONS Consider which positions are most painful for you when choosing a resting position. If you have pain with flexion-based activities (sitting, bending, stooping, squatting), choose a position that allows you to rest in a less flexed  posture. You would want to avoid curling into a fetal position on your side. If your pain worsens with extension-based activities (prolonged standing, working overhead), avoid resting in an extended position such as sleeping on your stomach. Most people will find more comfort when they rest with their spine in a more neutral position, neither too rounded nor too arched. Lying on a non-sagging bed on your side with a pillow between your knees, or on your back with a pillow under your knees will often provide some relief. Keep in mind, being in any one position for a prolonged period of time, no matter how correct your posture, can still lead to stiffness. PROPER SITTING POSTURE In order to minimize stress and discomfort on your spine, you must sit with correct posture. Sitting with good posture should be effortless for a healthy body. Returning to good posture is a gradual process. Many people can work toward this most comfortably by using various supports until they have the flexibility and strength to maintain this posture on their own. When sitting with proper posture, your ears will fall over your shoulders and your shoulders will fall over your hips. You should use the back of the chair to support your upper back. Your lower back will be in a neutral position, just slightly arched. You may place a small pillow or folded towel at the base of your lower back for support.  When working at a desk, create an environment that supports good, upright posture. Without extra support, muscles tire, which leads to excessive strain  on joints and other tissues. Keep these recommendations in mind: CHAIR:  A chair should be able to slide under your desk when your back makes contact with the back of the chair. This allows you to work closely.  The chair's height should allow your eyes to be level with the upper part of your monitor and your hands to be slightly lower than your elbows. BODY POSITION  Your feet should  make contact with the floor. If this is not possible, use a foot rest.  Keep your ears over your shoulders. This will reduce stress on your neck and lower back. INCORRECT SITTING POSTURES  If you are feeling tired and unable to assume a healthy sitting posture, do not slouch or slump. This puts excessive strain on your back tissues, causing more damage and pain. Healthier options include:  Using more support, like a lumbar pillow.  Switching tasks to something that requires you to be upright or walking.  Talking a brief walk.  Lying down to rest in a neutral-spine position. PROLONGED STANDING WHILE SLIGHTLY LEANING FORWARD  When completing a task that requires you to lean forward while standing in one place for a long time, place either foot up on a stationary 2-4 inch high object to help maintain the best posture. When both feet are on the ground, the lower back tends to lose its slight inward curve. If this curve flattens (or becomes too large), then the back and your other joints will experience too much stress, tire more quickly, and can cause pain. CORRECT STANDING POSTURES Proper standing posture should be assumed with all daily activities, even if they only take a few moments, like when brushing your teeth. As in sitting, your ears should fall over your shoulders and your shoulders should fall over your hips. You should keep a slight tension in your abdominal muscles to brace your spine. Your tailbone should point down to the ground, not behind your body, resulting in an over-extended swayback posture.  INCORRECT STANDING POSTURES  Common incorrect standing postures include a forward head, locked knees and/or an excessive swayback. WALKING Walk with an upright posture. Your ears, shoulders and hips should all line-up. PROLONGED ACTIVITY IN A FLEXED POSITION When completing a task that requires you to bend forward at your waist or lean over a low surface, try to find a way to stabilize 3  out of 4 of your limbs. You can place a hand or elbow on your thigh or rest a knee on the surface you are reaching across. This will provide you more stability so that your muscles do not fatigue as quickly. By keeping your knees relaxed, or slightly bent, you will also reduce stress across your lower back. CORRECT LIFTING TECHNIQUES DO :   Assume a wide stance. This will provide you more stability and the opportunity to get as close as possible to the object which you are lifting.  Tense your abdominals to brace your spine. Bend at the knees and hips. Keeping your back locked in a neutral-spine position, lift using your leg muscles. Lift with your legs, keeping your back straight.  Test the weight of unknown objects before attempting to lift them.  Try to keep your elbows locked down at your sides in order get the best strength from your shoulders when carrying an object.  Always ask for help when lifting heavy or awkward objects. INCORRECT LIFTING TECHNIQUES DO NOT:   Lock your knees when lifting, even if it is a small  object.  Bend and twist. Pivot at your feet or move your feet when needing to change directions.  Assume that you can safely pick up even a paper clip without proper posture. Document Released: 12/15/2005 Document Revised: 03/08/2012 Document Reviewed: 03/29/2009 [facility] Patient Information 2014 [place], [place].       [name]. Panosh M.D.  Pre visit review using our clinic review tool, if applicable. No additional management support is needed unless otherwise documented below in the visit note.

## 2014-01-10 ENCOUNTER — Encounter: Payer: Self-pay | Admitting: Family Medicine

## 2014-01-16 ENCOUNTER — Ambulatory Visit: Payer: 59 | Attending: Internal Medicine

## 2014-01-16 DIAGNOSIS — M545 Low back pain, unspecified: Secondary | ICD-10-CM | POA: Insufficient documentation

## 2014-01-16 DIAGNOSIS — IMO0001 Reserved for inherently not codable concepts without codable children: Secondary | ICD-10-CM | POA: Insufficient documentation

## 2014-01-24 ENCOUNTER — Ambulatory Visit: Payer: 59 | Admitting: Physical Therapy

## 2014-01-26 ENCOUNTER — Encounter: Payer: 59 | Admitting: Rehabilitation

## 2014-02-07 ENCOUNTER — Other Ambulatory Visit: Payer: Self-pay | Admitting: Endocrinology

## 2014-03-17 ENCOUNTER — Ambulatory Visit: Payer: 59 | Admitting: Endocrinology

## 2014-03-24 ENCOUNTER — Ambulatory Visit: Payer: 59 | Admitting: Endocrinology

## 2014-03-28 ENCOUNTER — Encounter: Payer: Self-pay | Admitting: Endocrinology

## 2014-03-28 ENCOUNTER — Ambulatory Visit (INDEPENDENT_AMBULATORY_CARE_PROVIDER_SITE_OTHER): Payer: 59 | Admitting: Endocrinology

## 2014-03-28 VITALS — BP 116/76 | HR 84 | Temp 98.1°F | Ht 64.0 in | Wt 162.0 lb

## 2014-03-28 DIAGNOSIS — E0590 Thyrotoxicosis, unspecified without thyrotoxic crisis or storm: Secondary | ICD-10-CM

## 2014-03-28 MED ORDER — METHIMAZOLE 5 MG PO TABS: 5.0000 mg | ORAL_TABLET | Freq: Every day | ORAL | Status: DC

## 2014-03-28 NOTE — Patient Instructions (Addendum)
blood tests are being requested for you today.  We'll contact you with results. i have sent a prescription to your pharmacy, to resume the methimazole. if ever you have fever while taking methimazole, stop it and call us, because of the risk of a rare side-effect Please come back for a follow-up appointment in 2 months.  Please come back sooner if the pregnancy happens.

## 2014-03-28 NOTE — Progress Notes (Signed)
Subjective:    Patient ID: Crystal Mcintyre, female    DOB: [birth year], 38 y.o.   MRN: [id number]  HPI Pt returns for f/u of small multinodular goiter, first found in 2010. Nodules were not big enough to need bx. She does not notice the goiter.  She would like to start a pregnancy soon, but has not conceived yet. In mid-2014, she was noted by dr Regis Bill to have a suppressed TSH, and was started on tapazole.  However, she ran out 3 weeks ago.  pt states she feels well in general, except for anxiety.   Past Medical History  Diagnosis Date  . GOITER, MULTINODULAR 04/18/2009  . THYROID STIMULATING HORMONE, ABNORMAL 01/10/2009  . Other anxiety states 01/30/2010  . TOBACCO USE 01/10/2008  . IRREGULAR MENSTRUATION 06/17/2007  . Headache(784.0) 06/17/2007  . TRANSAMINASES, SERUM, ELEVATED 01/10/2009    Past Surgical History  Procedure Laterality Date  . Tympanostomy tube placement  as child  . Tonsillectomy  as child  . Tympanoplasty  4 yrs ago  . Upper gastrointestinal endoscopy      History   Social History  . Marital Status: Married    Spouse Name: N/A    Number of Children: N/A  . Years of Education: N/A   Occupational History  . Intake Coordinator at [employer] History Main Topics  . Smoking status: Former Smoker    Quit date: 12/29/2008  . Smokeless tobacco: Never Used  . Alcohol Use: Yes     Comment: occasional  . Drug Use: No  . Sexual Activity: Yes    Partners: Male   Other Topics Concern  . 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          Current Outpatient Prescriptions on File Prior to Visit  Medication Sig Dispense Refill  . acetaminophen (TYLENOL) 500 MG tablet Take 500 mg by mouth every 6 (six) hours as needed.      Marland Kitchen aspirin-acetaminophen-caffeine (EXCEDRIN MIGRAINE) 250-250-65 MG per tablet Take 2 tablets by mouth every 6 (six) hours as needed. For pain      . omeprazole  (PRILOSEC) 20 MG capsule TAKE 1 CAPSULE TWICE A DAY  60 capsule  1  . Prenatal Vit-Fe Fumarate-FA (PRENATAL MULTIVITAMIN) TABS Take 1 tablet by mouth daily.       No current facility-administered medications on file prior to visit.    No Known Allergies  Family History  Problem Relation Age of Onset  . Pneumonia Father   . Alcohol abuse Father   . Colon polyps Mother   . Breast cancer Paternal Aunt   . Diabetes Maternal Grandfather   . Prostate cancer Paternal Grandfather   . Rectal cancer Neg Hx   . Stomach cancer Neg Hx   . Colon cancer Neg Hx     BP 116/76  Pulse 84  Temp(Src) 98.1 F (36.7 C) (Oral)  Ht 5\' 4"  (1.626 m)  Wt 162 lb (73.483 kg)  BMI 27.79 kg/m2  SpO2 96%  Review of Systems Denies weight change.    Objective:   Physical Exam VITAL SIGNS:  See vs page GENERAL: no distress Neck: thyroid is 2x normal size on the right lobe, and normal on the left. The surface seems to be multinodular   Lab Results  Component Value Date   TSH 0.50 03/28/2014      Assessment &  Plan:  Hyperthyroidism: therapy limited by noncompliance with tapazole.  Low T4 level--often seen in multinodular goiter.

## 2014-03-29 LAB — TSH: TSH: 0.5 u[IU]/mL (ref 0.35–5.50)

## 2014-03-29 LAB — T4, FREE: Free T4: 0.5 ng/dL — ABNORMAL LOW (ref 0.60–1.60)

## 2014-05-19 ENCOUNTER — Encounter: Payer: Self-pay | Admitting: Internal Medicine

## 2014-05-19 ENCOUNTER — Ambulatory Visit (INDEPENDENT_AMBULATORY_CARE_PROVIDER_SITE_OTHER): Payer: 59 | Admitting: Internal Medicine

## 2014-05-19 VITALS — BP 110/70 | Temp 98.5°F | Ht 64.75 in | Wt 165.0 lb

## 2014-05-19 DIAGNOSIS — M5430 Sciatica, unspecified side: Secondary | ICD-10-CM

## 2014-05-19 DIAGNOSIS — M545 Low back pain, unspecified: Secondary | ICD-10-CM

## 2014-05-19 DIAGNOSIS — K219 Gastro-esophageal reflux disease without esophagitis: Secondary | ICD-10-CM

## 2014-05-19 MED ORDER — MELOXICAM 15 MG PO TABS: 15.0000 mg | ORAL_TABLET | Freq: Every day | ORAL | Status: DC

## 2014-05-19 NOTE — Patient Instructions (Signed)
Take prilosec the days you take  The mobic  Take the antiinflammatory  Every  Day fore the next 2 weeks and then as needed.  If not improving  Then we may need to get you to see   Sports medicine  Or ortho.  Sciatica with Rehab The sciatic nerve runs from the back down the leg and is responsible for sensation and control of the muscles in the back (posterior) side of the thigh, lower leg, and foot. Sciatica is a condition that is characterized by inflammation of this nerve.  SYMPTOMS   Signs of nerve damage, including numbness and/or weakness along the posterior side of the lower extremity.  Pain in the back of the thigh that may also travel down the leg.  Pain that worsens when sitting for long periods of time.  Occasionally, pain in the back or buttock. CAUSES  Inflammation of the sciatic nerve is the cause of sciatica. The inflammation is due to something irritating the nerve. Common sources of irritation include:  Sitting for long periods of time.  Direct trauma to the nerve.  Arthritis of the spine.  Herniated or ruptured disk.  Slipping of the vertebrae (spondylolithesis)  Pressure from soft tissues, such as muscles or ligament-like tissue (fascia). RISK INCREASES WITH:  Sports that place pressure or stress on the spine (football or weightlifting).  Poor strength and flexibility.  Failure to warm-up properly before activity.  Family history of low back pain or disk disorders.  Previous back injury or surgery.  Poor body mechanics, especially when lifting, or poor posture. PREVENTION   Warm up and stretch properly before activity.  Maintain physical fitness:  Strength, flexibility, and endurance.  Cardiovascular fitness.  Learn and use proper technique, especially with posture and lifting. When possible, have coach correct improper technique.  Avoid activities that place stress on the spine. PROGNOSIS If treated properly, then sciatica usually resolves  within 6 weeks. However, occasionally surgery is necessary.  RELATED COMPLICATIONS   Permanent nerve damage, including pain, numbness, tingle, or weakness.  Chronic back pain.  Risks of surgery: infection, bleeding, nerve damage, or damage to surrounding tissues. TREATMENT Treatment initially involves resting from any activities that aggravate your symptoms. The use of ice and medication may help reduce pain and inflammation. The use of strengthening and stretching exercises may help reduce pain with activity. These exercises may be performed at home or with referral to a therapist. A therapist may recommend further treatments, such as transcutaneous electronic nerve stimulation (TENS) or ultrasound. Your caregiver may recommend corticosteroid injections to help reduce inflammation of the sciatic nerve. If symptoms persist despite non-surgical (conservative) treatment, then surgery may be recommended. MEDICATION  If pain medication is necessary, then nonsteroidal anti-inflammatory medications, such as aspirin and ibuprofen, or other minor pain relievers, such as acetaminophen, are often recommended.  Do not take pain medication for 7 days before surgery.  Prescription pain relievers may be given if deemed necessary by your caregiver. Use only as directed and only as much as you need.  Ointments applied to the skin may be helpful.  Corticosteroid injections may be given by your caregiver. These injections should be reserved for the most serious cases, because they may only be given a certain number of times. HEAT AND COLD  Cold treatment (icing) relieves pain and reduces inflammation. Cold treatment should be applied for 10 to 15 minutes every 2 to 3 hours for inflammation and pain and immediately after any activity that aggravates your symptoms. Use  ice packs or massage the area with a piece of ice (ice massage).  Heat treatment may be used prior to performing the stretching and  strengthening activities prescribed by your caregiver, physical therapist, or athletic trainer. Use a heat pack or soak the injury in warm water. SEEK MEDICAL CARE IF:  Treatment seems to offer no benefit, or the condition worsens.  Any medications produce adverse side effects. EXERCISES  RANGE OF MOTION (ROM) AND STRETCHING EXERCISES - Sciatica Most people with sciatic will find that their symptoms worsen with either excessive bending forward (flexion) or arching at the low back (extension). The exercises which will help resolve your symptoms will focus on the opposite motion. Your physician, physical therapist or athletic trainer will help you determine which exercises will be most helpful to resolve your low back pain. Do not complete any exercises without first consulting with your clinician. Discontinue any exercises which worsen your symptoms until you speak to your clinician. If you have pain, numbness or tingling which travels down into your buttocks, leg or foot, the goal of the therapy is for these symptoms to move closer to your back and eventually resolve. Occasionally, these leg symptoms will get better, but your low back pain may worsen; this is typically an indication of progress in your rehabilitation. Be certain to be very alert to any changes in your symptoms and the activities in which you participated in the 24 hours prior to the change. Sharing this information with your clinician will allow him/her to most efficiently treat your condition. These exercises may help you when beginning to rehabilitate your injury. Your symptoms may resolve with or without further involvement from your physician, physical therapist or athletic trainer. While completing these exercises, remember:   Restoring tissue flexibility helps normal motion to return to the joints. This allows healthier, less painful movement and activity.  An effective stretch should be held for at least 30 seconds.  A stretch  should never be painful. You should only feel a gentle lengthening or release in the stretched tissue. FLEXION RANGE OF MOTION AND STRETCHING EXERCISES: STRETCH  Flexion, Single Knee to Chest   Lie on a firm bed or floor with both legs extended in front of you.  Keeping one leg in contact with the floor, bring your opposite knee to your chest. Hold your leg in place by either grabbing behind your thigh or at your knee.  Pull until you feel a gentle stretch in your low back. Hold __________ seconds.  Slowly release your grasp and repeat the exercise with the opposite side. Repeat __________ times. Complete this exercise __________ times per day.  STRETCH  Flexion, Double Knee to Chest  Lie on a firm bed or floor with both legs extended in front of you.  Keeping one leg in contact with the floor, bring your opposite knee to your chest.  Tense your stomach muscles to support your back and then lift your other knee to your chest. Hold your legs in place by either grabbing behind your thighs or at your knees.  Pull both knees toward your chest until you feel a gentle stretch in your low back. Hold __________ seconds.  Tense your stomach muscles and slowly return one leg at a time to the floor. Repeat __________ times. Complete this exercise __________ times per day.  STRETCH  Low Trunk Rotation   Lie on a firm bed or floor. Keeping your legs in front of you, bend your knees so they are both  pointed toward the ceiling and your feet are flat on the floor.  Extend your arms out to the side. This will stabilize your upper body by keeping your shoulders in contact with the floor.  Gently and slowly drop both knees together to one side until you feel a gentle stretch in your low back. Hold for __________ seconds.  Tense your stomach muscles to support your low back as you bring your knees back to the starting position. Repeat the exercise to the other side. Repeat __________ times. Complete  this exercise __________ times per day  EXTENSION RANGE OF MOTION AND FLEXIBILITY EXERCISES: STRETCH  Extension, Prone on Elbows  Lie on your stomach on the floor, a bed will be too soft. Place your palms about shoulder width apart and at the height of your head.  Place your elbows under your shoulders. If this is too painful, stack pillows under your chest.  Allow your body to relax so that your hips drop lower and make contact more completely with the floor.  Hold this position for __________ seconds.  Slowly return to lying flat on the floor. Repeat __________ times. Complete this exercise __________ times per day.  RANGE OF MOTION  Extension, Prone Press Ups  Lie on your stomach on the floor, a bed will be too soft. Place your palms about shoulder width apart and at the height of your head.  Keeping your back as relaxed as possible, slowly straighten your elbows while keeping your hips on the floor. You may adjust the placement of your hands to maximize your comfort. As you gain motion, your hands will come more underneath your shoulders.  Hold this position __________ seconds.  Slowly return to lying flat on the floor. Repeat __________ times. Complete this exercise __________ times per day.  STRENGTHENING EXERCISES - Sciatica  These exercises may help you when beginning to rehabilitate your injury. These exercises should be done near your "sweet spot." This is the neutral, low-back arch, somewhere between fully rounded and fully arched, that is your least painful position. When performed in this safe range of motion, these exercises can be used for people who have either a flexion or extension based injury. These exercises may resolve your symptoms with or without further involvement from your physician, physical therapist or athletic trainer. While completing these exercises, remember:   Muscles can gain both the endurance and the strength needed for everyday activities through  controlled exercises.  Complete these exercises as instructed by your physician, physical therapist or athletic trainer. Progress with the resistance and repetition exercises only as your caregiver advises.  You may experience muscle soreness or fatigue, but the pain or discomfort you are trying to eliminate should never worsen during these exercises. If this pain does worsen, stop and make certain you are following the directions exactly. If the pain is still present after adjustments, discontinue the exercise until you can discuss the trouble with your clinician. STRENGTHENING Deep Abdominals, Pelvic Tilt   Lie on a firm bed or floor. Keeping your legs in front of you, bend your knees so they are both pointed toward the ceiling and your feet are flat on the floor.  Tense your lower abdominal muscles to press your low back into the floor. This motion will rotate your pelvis so that your tail bone is scooping upwards rather than pointing at your feet or into the floor.  With a gentle tension and even breathing, hold this position for __________ seconds. Repeat __________ times.  Complete this exercise __________ times per day.  STRENGTHENING  Abdominals, Crunches   Lie on a firm bed or floor. Keeping your legs in front of you, bend your knees so they are both pointed toward the ceiling and your feet are flat on the floor. Cross your arms over your chest.  Slightly tip your chin down without bending your neck.  Tense your abdominals and slowly lift your trunk high enough to just clear your shoulder blades. Lifting higher can put excessive stress on the low back and does not further strengthen your abdominal muscles.  Control your return to the starting position. Repeat __________ times. Complete this exercise __________ times per day.  STRENGTHENING  Quadruped, Opposite UE/LE Lift  Assume a hands and knees position on a firm surface. Keep your hands under your shoulders and your knees under  your hips. You may place padding under your knees for comfort.  Find your neutral spine and gently tense your abdominal muscles so that you can maintain this position. Your shoulders and hips should form a rectangle that is parallel with the floor and is not twisted.  Keeping your trunk steady, lift your right hand no higher than your shoulder and then your left leg no higher than your hip. Make sure you are not holding your breath. Hold this position __________ seconds.  Continuing to keep your abdominal muscles tense and your back steady, slowly return to your starting position. Repeat with the opposite arm and leg. Repeat __________ times. Complete this exercise __________ times per day.  STRENGTHENING  Abdominals and Quadriceps, Straight Leg Raise   Lie on a firm bed or floor with both legs extended in front of you.  Keeping one leg in contact with the floor, bend the other knee so that your foot can rest flat on the floor.  Find your neutral spine, and tense your abdominal muscles to maintain your spinal position throughout the exercise.  Slowly lift your straight leg off the floor about 6 inches for a count of 15, making sure to not hold your breath.  Still keeping your neutral spine, slowly lower your leg all the way to the floor. Repeat this exercise with each leg __________ times. Complete this exercise __________ times per day. POSTURE AND BODY MECHANICS CONSIDERATIONS - Sciatica Keeping correct posture when sitting, standing or completing your activities will reduce the stress put on different body tissues, allowing injured tissues a chance to heal and limiting painful experiences. The following are general guidelines for improved posture. Your physician or physical therapist will provide you with any instructions specific to your needs. While reading these guidelines, remember:  The exercises prescribed by your provider will help you have the flexibility and strength to maintain  correct postures.  The correct posture provides the optimal environment for your joints to work. All of your joints have less wear and tear when properly supported by a spine with good posture. This means you will experience a healthier, less painful body.  Correct posture must be practiced with all of your activities, especially prolonged sitting and standing. Correct posture is as important when doing repetitive low-stress activities (typing) as it is when doing a single heavy-load activity (lifting). RESTING POSITIONS Consider which positions are most painful for you when choosing a resting position. If you have pain with flexion-based activities (sitting, bending, stooping, squatting), choose a position that allows you to rest in a less flexed posture. You would want to avoid curling into a fetal position on your  side. If your pain worsens with extension-based activities (prolonged standing, working overhead), avoid resting in an extended position such as sleeping on your stomach. Most people will find more comfort when they rest with their spine in a more neutral position, neither too rounded nor too arched. Lying on a non-sagging bed on your side with a pillow between your knees, or on your back with a pillow under your knees will often provide some relief. Keep in mind, being in any one position for a prolonged period of time, no matter how correct your posture, can still lead to stiffness. PROPER SITTING POSTURE In order to minimize stress and discomfort on your spine, you must sit with correct posture Sitting with good posture should be effortless for a healthy body. Returning to good posture is a gradual process. Many people can work toward this most comfortably by using various supports until they have the flexibility and strength to maintain this posture on their own. When sitting with proper posture, your ears will fall over your shoulders and your shoulders will fall over your hips. You should  use the back of the chair to support your upper back. Your low back will be in a neutral position, just slightly arched. You may place a small pillow or folded towel at the base of your low back for support.  When working at a desk, create an environment that supports good, upright posture. Without extra support, muscles fatigue and lead to excessive strain on joints and other tissues. Keep these recommendations in mind: CHAIR:   A chair should be able to slide under your desk when your back makes contact with the back of the chair. This allows you to work closely.  The chair's height should allow your eyes to be level with the upper part of your monitor and your hands to be slightly lower than your elbows. BODY POSITION  Your feet should make contact with the floor. If this is not possible, use a foot rest.  Keep your ears over your shoulders. This will reduce stress on your neck and low back. INCORRECT SITTING POSTURES   If you are feeling tired and unable to assume a healthy sitting posture, do not slouch or slump. This puts excessive strain on your back tissues, causing more damage and pain. Healthier options include:  Using more support, like a lumbar pillow.  Switching tasks to something that requires you to be upright or walking.  Talking a brief walk.  Lying down to rest in a neutral-spine position. PROLONGED STANDING WHILE SLIGHTLY LEANING FORWARD  When completing a task that requires you to lean forward while standing in one place for a long time, place either foot up on a stationary 2-4 inch high object to help maintain the best posture. When both feet are on the ground, the low back tends to lose its slight inward curve. If this curve flattens (or becomes too large), then the back and your other joints will experience too much stress, fatigue more quickly and can cause pain.  CORRECT STANDING POSTURES Proper standing posture should be assumed with all daily activities, even if  they only take a few moments, like when brushing your teeth. As in sitting, your ears should fall over your shoulders and your shoulders should fall over your hips. You should keep a slight tension in your abdominal muscles to brace your spine. Your tailbone should point down to the ground, not behind your body, resulting in an over-extended swayback posture.  INCORRECT STANDING  POSTURES  Common incorrect standing postures include a forward head, locked knees and/or an excessive swayback. WALKING Walk with an upright posture. Your ears, shoulders and hips should all line-up. PROLONGED ACTIVITY IN A FLEXED POSITION When completing a task that requires you to bend forward at your waist or lean over a low surface, try to find a way to stabilize 3 of 4 of your limbs. You can place a hand or elbow on your thigh or rest a knee on the surface you are reaching across. This will provide you more stability so that your muscles do not fatigue as quickly. By keeping your knees relaxed, or slightly bent, you will also reduce stress across your low back. CORRECT LIFTING TECHNIQUES DO :   Assume a wide stance. This will provide you more stability and the opportunity to get as close as possible to the object which you are lifting.  Tense your abdominals to brace your spine; then bend at the knees and hips. Keeping your back locked in a neutral-spine position, lift using your leg muscles. Lift with your legs, keeping your back straight.  Test the weight of unknown objects before attempting to lift them.  Try to keep your elbows locked down at your sides in order get the best strength from your shoulders when carrying an object.  Always ask for help when lifting heavy or awkward objects. INCORRECT LIFTING TECHNIQUES DO NOT:   Lock your knees when lifting, even if it is a small object.  Bend and twist. Pivot at your feet or move your feet when needing to change directions.  Assume that you cannot safely pick  up a paperclip without proper posture. Document Released: 12/15/2005 Document Revised: 03/08/2012 Document Reviewed: 03/29/2009 [facility] Patient Information 2014 [place], [place].

## 2014-05-19 NOTE — Progress Notes (Signed)
Chief Complaint  Patient presents with  . Follow-up    Patient now has leg pain in her left leg.  Starts in the middle of her buttocks and goes down her leg.  Has numbness and tingling in her feet..  Started last week.    HPI: Patient comes in today for SDA for  ongoingproblem evaluation. Pain down leg and down to bottom of foot and then walking  Better.  Hurts to sit using cold and heat to help . conintuous pain  For a week family advise she come in  No weakness falling  Pain numbness down back of leg left to foot . No positino that comfortable  Taking tylenol   No injury   Had pt and did better with back. Was doing well.  Cant take asa or aleve.  Not nsaid.  Because of chest esophagus hurting  Takes prilosec as needed  . Not allergic  ROS: See pertinent positives and negatives per HPI.  Past Medical History  Diagnosis Date  . GOITER, MULTINODULAR 04/18/2009  . THYROID STIMULATING HORMONE, ABNORMAL 01/10/2009  . Other anxiety states 01/30/2010  . TOBACCO USE 01/10/2008  . IRREGULAR MENSTRUATION 06/17/2007  . Headache(784.0) 06/17/2007  . TRANSAMINASES, SERUM, ELEVATED 01/10/2009    Family History  Problem Relation Age of Onset  . Pneumonia Father   . Alcohol abuse Father   . Colon polyps Mother   . Breast cancer Paternal Aunt   . Diabetes Maternal Grandfather   . Prostate cancer Paternal Grandfather   . Rectal cancer Neg Hx   . Stomach cancer Neg Hx   . Colon cancer Neg Hx     History   Social History  . Marital Status: Married    Spouse Name: N/A    Number of Children: N/A  . Years of Education: N/A   Occupational History  . Intake Coordinator at [employer] History Main Topics  . Smoking status: Former Smoker    Quit date: 12/29/2008  . Smokeless tobacco: Never Used  . Alcohol Use: Yes     Comment: occasional  . Drug Use: No  . Sexual Activity: Yes    Partners: Male   Other Topics Concern  . None   Social History Narrative   HH of 2   Pet  dog   [employer]          Outpatient Encounter Prescriptions as of 05/19/2014  Medication Sig  . acetaminophen (TYLENOL) 500 MG tablet Take 500 mg by mouth every 6 (six) hours as needed.  Marland Kitchen aspirin-acetaminophen-caffeine (EXCEDRIN MIGRAINE) 250-250-65 MG per tablet Take 2 tablets by mouth every 6 (six) hours as needed. For pain  . methimazole (TAPAZOLE) 5 MG tablet Take 1 tablet (5 mg total) by mouth daily.  Marland Kitchen omeprazole (PRILOSEC) 20 MG capsule TAKE 1 CAPSULE TWICE A DAY  . Prenatal Vit-Fe Fumarate-FA (PRENATAL MULTIVITAMIN) TABS Take 1 tablet by mouth daily.  . meloxicam (MOBIC) 15 MG tablet Take 1 tablet (15 mg total) by mouth daily.    EXAM:  BP 110/70  Temp(Src) 98.5 F (36.9 C) (Oral)  Ht 5' 4.75" (1.645 m)  Wt 165 lb (74.844 kg)  BMI 27.66 kg/m2  Body mass index is 27.66 kg/(m^2).  GENERAL: vitals reviewed and listed above, alert, oriented, appears well hydrated and in no acute distress antalgic  Hard to sit  Nl leans back  HEENT: atraumatic, conjunctiva  clear, no obvious abnormalities on inspection of external  nose and ears  NECK: no obvious masses on inspection palpation  MS: moves all extremities  antalgic   Neuro dtrs nl Pos SLR no weakness  PSYCH: pleasant and cooperative, no obvious depression or anxiety  ASSESSMENT AND PLAN:  Discussed the following assessment and plan:  Left low back pain  Sciatic neuritis - no  alarm features at this time   GERD (gastroesophageal reflux disease) Trial of mobic with gi protection  As discussed for antiinflammaory  Then plan fu if not a lot better in 2 weeks  Or if worse -Patient advised to return or notify health care team  if symptoms worsen ,persist or new concerns arise.  Patient Instructions  Take prilosec the days you take  The mobic  Take the antiinflammatory  Every  Day fore the next 2 weeks and then as needed.  If not improving  Then we may need to get you to see   Sports medicine   Or ortho.  Sciatica with Rehab The sciatic nerve runs from the back down the leg and is responsible for sensation and control of the muscles in the back (posterior) side of the thigh, lower leg, and foot. Sciatica is a condition that is characterized by inflammation of this nerve.  SYMPTOMS   Signs of nerve damage, including numbness and/or weakness along the posterior side of the lower extremity.  Pain in the back of the thigh that may also travel down the leg.  Pain that worsens when sitting for long periods of time.  Occasionally, pain in the back or buttock. CAUSES  Inflammation of the sciatic nerve is the cause of sciatica. The inflammation is due to something irritating the nerve. Common sources of irritation include:  Sitting for long periods of time.  Direct trauma to the nerve.  Arthritis of the spine.  Herniated or ruptured disk.  Slipping of the vertebrae (spondylolithesis)  Pressure from soft tissues, such as muscles or ligament-like tissue (fascia). RISK INCREASES WITH:  Sports that place pressure or stress on the spine (football or weightlifting).  Poor strength and flexibility.  Failure to warm-up properly before activity.  Family history of low back pain or disk disorders.  Previous back injury or surgery.  Poor body mechanics, especially when lifting, or poor posture. PREVENTION   Warm up and stretch properly before activity.  Maintain physical fitness:  Strength, flexibility, and endurance.  Cardiovascular fitness.  Learn and use proper technique, especially with posture and lifting. When possible, have coach correct improper technique.  Avoid activities that place stress on the spine. PROGNOSIS If treated properly, then sciatica usually resolves within 6 weeks. However, occasionally surgery is necessary.  RELATED COMPLICATIONS   Permanent nerve damage, including pain, numbness, tingle, or weakness.  Chronic back pain.  Risks of surgery:  infection, bleeding, nerve damage, or damage to surrounding tissues. TREATMENT Treatment initially involves resting from any activities that aggravate your symptoms. The use of ice and medication may help reduce pain and inflammation. The use of strengthening and stretching exercises may help reduce pain with activity. These exercises may be performed at home or with referral to a therapist. A therapist may recommend further treatments, such as transcutaneous electronic nerve stimulation (TENS) or ultrasound. Your caregiver may recommend corticosteroid injections to help reduce inflammation of the sciatic nerve. If symptoms persist despite non-surgical (conservative) treatment, then surgery may be recommended. MEDICATION  If pain medication is necessary, then nonsteroidal anti-inflammatory medications, such as aspirin and ibuprofen, or other minor pain relievers, such  as acetaminophen, are often recommended.  Do not take pain medication for 7 days before surgery.  Prescription pain relievers may be given if deemed necessary by your caregiver. Use only as directed and only as much as you need.  Ointments applied to the skin may be helpful.  Corticosteroid injections may be given by your caregiver. These injections should be reserved for the most serious cases, because they may only be given a certain number of times. HEAT AND COLD  Cold treatment (icing) relieves pain and reduces inflammation. Cold treatment should be applied for 10 to 15 minutes every 2 to 3 hours for inflammation and pain and immediately after any activity that aggravates your symptoms. Use ice packs or massage the area with a piece of ice (ice massage).  Heat treatment may be used prior to performing the stretching and strengthening activities prescribed by your caregiver, physical therapist, or athletic trainer. Use a heat pack or soak the injury in warm water. SEEK MEDICAL CARE IF:  Treatment seems to offer no benefit, or  the condition worsens.  Any medications produce adverse side effects. EXERCISES  RANGE OF MOTION (ROM) AND STRETCHING EXERCISES - Sciatica Most people with sciatic will find that their symptoms worsen with either excessive bending forward (flexion) or arching at the low back (extension). The exercises which will help resolve your symptoms will focus on the opposite motion. Your physician, physical therapist or athletic trainer will help you determine which exercises will be most helpful to resolve your low back pain. Do not complete any exercises without first consulting with your clinician. Discontinue any exercises which worsen your symptoms until you speak to your clinician. If you have pain, numbness or tingling which travels down into your buttocks, leg or foot, the goal of the therapy is for these symptoms to move closer to your back and eventually resolve. Occasionally, these leg symptoms will get better, but your low back pain may worsen; this is typically an indication of progress in your rehabilitation. Be certain to be very alert to any changes in your symptoms and the activities in which you participated in the 24 hours prior to the change. Sharing this information with your clinician will allow him/her to most efficiently treat your condition. These exercises may help you when beginning to rehabilitate your injury. Your symptoms may resolve with or without further involvement from your physician, physical therapist or athletic trainer. While completing these exercises, remember:   Restoring tissue flexibility helps normal motion to return to the joints. This allows healthier, less painful movement and activity.  An effective stretch should be held for at least 30 seconds.  A stretch should never be painful. You should only feel a gentle lengthening or release in the stretched tissue. FLEXION RANGE OF MOTION AND STRETCHING EXERCISES: STRETCH  Flexion, Single Knee to Chest   Lie on a firm  bed or floor with both legs extended in front of you.  Keeping one leg in contact with the floor, bring your opposite knee to your chest. Hold your leg in place by either grabbing behind your thigh or at your knee.  Pull until you feel a gentle stretch in your low back. Hold __________ seconds.  Slowly release your grasp and repeat the exercise with the opposite side. Repeat __________ times. Complete this exercise __________ times per day.  STRETCH  Flexion, Double Knee to Chest  Lie on a firm bed or floor with both legs extended in front of you.  Keeping one leg  in contact with the floor, bring your opposite knee to your chest.  Tense your stomach muscles to support your back and then lift your other knee to your chest. Hold your legs in place by either grabbing behind your thighs or at your knees.  Pull both knees toward your chest until you feel a gentle stretch in your low back. Hold __________ seconds.  Tense your stomach muscles and slowly return one leg at a time to the floor. Repeat __________ times. Complete this exercise __________ times per day.  STRETCH  Low Trunk Rotation   Lie on a firm bed or floor. Keeping your legs in front of you, bend your knees so they are both pointed toward the ceiling and your feet are flat on the floor.  Extend your arms out to the side. This will stabilize your upper body by keeping your shoulders in contact with the floor.  Gently and slowly drop both knees together to one side until you feel a gentle stretch in your low back. Hold for __________ seconds.  Tense your stomach muscles to support your low back as you bring your knees back to the starting position. Repeat the exercise to the other side. Repeat __________ times. Complete this exercise __________ times per day  EXTENSION RANGE OF MOTION AND FLEXIBILITY EXERCISES: STRETCH  Extension, Prone on Elbows  Lie on your stomach on the floor, a bed will be too soft. Place your palms about  shoulder width apart and at the height of your head.  Place your elbows under your shoulders. If this is too painful, stack pillows under your chest.  Allow your body to relax so that your hips drop lower and make contact more completely with the floor.  Hold this position for __________ seconds.  Slowly return to lying flat on the floor. Repeat __________ times. Complete this exercise __________ times per day.  RANGE OF MOTION  Extension, Prone Press Ups  Lie on your stomach on the floor, a bed will be too soft. Place your palms about shoulder width apart and at the height of your head.  Keeping your back as relaxed as possible, slowly straighten your elbows while keeping your hips on the floor. You may adjust the placement of your hands to maximize your comfort. As you gain motion, your hands will come more underneath your shoulders.  Hold this position __________ seconds.  Slowly return to lying flat on the floor. Repeat __________ times. Complete this exercise __________ times per day.  STRENGTHENING EXERCISES - Sciatica  These exercises may help you when beginning to rehabilitate your injury. These exercises should be done near your "sweet spot." This is the neutral, low-back arch, somewhere between fully rounded and fully arched, that is your least painful position. When performed in this safe range of motion, these exercises can be used for people who have either a flexion or extension based injury. These exercises may resolve your symptoms with or without further involvement from your physician, physical therapist or athletic trainer. While completing these exercises, remember:   Muscles can gain both the endurance and the strength needed for everyday activities through controlled exercises.  Complete these exercises as instructed by your physician, physical therapist or athletic trainer. Progress with the resistance and repetition exercises only as your caregiver advises.  You may  experience muscle soreness or fatigue, but the pain or discomfort you are trying to eliminate should never worsen during these exercises. If this pain does worsen, stop and make certain you  are following the directions exactly. If the pain is still present after adjustments, discontinue the exercise until you can discuss the trouble with your clinician. STRENGTHENING Deep Abdominals, Pelvic Tilt   Lie on a firm bed or floor. Keeping your legs in front of you, bend your knees so they are both pointed toward the ceiling and your feet are flat on the floor.  Tense your lower abdominal muscles to press your low back into the floor. This motion will rotate your pelvis so that your tail bone is scooping upwards rather than pointing at your feet or into the floor.  With a gentle tension and even breathing, hold this position for __________ seconds. Repeat __________ times. Complete this exercise __________ times per day.  STRENGTHENING  Abdominals, Crunches   Lie on a firm bed or floor. Keeping your legs in front of you, bend your knees so they are both pointed toward the ceiling and your feet are flat on the floor. Cross your arms over your chest.  Slightly tip your chin down without bending your neck.  Tense your abdominals and slowly lift your trunk high enough to just clear your shoulder blades. Lifting higher can put excessive stress on the low back and does not further strengthen your abdominal muscles.  Control your return to the starting position. Repeat __________ times. Complete this exercise __________ times per day.  STRENGTHENING  Quadruped, Opposite UE/LE Lift  Assume a hands and knees position on a firm surface. Keep your hands under your shoulders and your knees under your hips. You may place padding under your knees for comfort.  Find your neutral spine and gently tense your abdominal muscles so that you can maintain this position. Your shoulders and hips should form a rectangle that  is parallel with the floor and is not twisted.  Keeping your trunk steady, lift your right hand no higher than your shoulder and then your left leg no higher than your hip. Make sure you are not holding your breath. Hold this position __________ seconds.  Continuing to keep your abdominal muscles tense and your back steady, slowly return to your starting position. Repeat with the opposite arm and leg. Repeat __________ times. Complete this exercise __________ times per day.  STRENGTHENING  Abdominals and Quadriceps, Straight Leg Raise   Lie on a firm bed or floor with both legs extended in front of you.  Keeping one leg in contact with the floor, bend the other knee so that your foot can rest flat on the floor.  Find your neutral spine, and tense your abdominal muscles to maintain your spinal position throughout the exercise.  Slowly lift your straight leg off the floor about 6 inches for a count of 15, making sure to not hold your breath.  Still keeping your neutral spine, slowly lower your leg all the way to the floor. Repeat this exercise with each leg __________ times. Complete this exercise __________ times per day. POSTURE AND BODY MECHANICS CONSIDERATIONS - Sciatica Keeping correct posture when sitting, standing or completing your activities will reduce the stress put on different body tissues, allowing injured tissues a chance to heal and limiting painful experiences. The following are general guidelines for improved posture. Your physician or physical therapist will provide you with any instructions specific to your needs. While reading these guidelines, remember:  The exercises prescribed by your provider will help you have the flexibility and strength to maintain correct postures.  The correct posture provides the optimal environment for your  joints to work. All of your joints have less wear and tear when properly supported by a spine with good posture. This means you will  experience a healthier, less painful body.  Correct posture must be practiced with all of your activities, especially prolonged sitting and standing. Correct posture is as important when doing repetitive low-stress activities (typing) as it is when doing a single heavy-load activity (lifting). RESTING POSITIONS Consider which positions are most painful for you when choosing a resting position. If you have pain with flexion-based activities (sitting, bending, stooping, squatting), choose a position that allows you to rest in a less flexed posture. You would want to avoid curling into a fetal position on your side. If your pain worsens with extension-based activities (prolonged standing, working overhead), avoid resting in an extended position such as sleeping on your stomach. Most people will find more comfort when they rest with their spine in a more neutral position, neither too rounded nor too arched. Lying on a non-sagging bed on your side with a pillow between your knees, or on your back with a pillow under your knees will often provide some relief. Keep in mind, being in any one position for a prolonged period of time, no matter how correct your posture, can still lead to stiffness. PROPER SITTING POSTURE In order to minimize stress and discomfort on your spine, you must sit with correct posture Sitting with good posture should be effortless for a healthy body. Returning to good posture is a gradual process. Many people can work toward this most comfortably by using various supports until they have the flexibility and strength to maintain this posture on their own. When sitting with proper posture, your ears will fall over your shoulders and your shoulders will fall over your hips. You should use the back of the chair to support your upper back. Your low back will be in a neutral position, just slightly arched. You may place a small pillow or folded towel at the base of your low back for support.  When  working at a desk, create an environment that supports good, upright posture. Without extra support, muscles fatigue and lead to excessive strain on joints and other tissues. Keep these recommendations in mind: CHAIR:   A chair should be able to slide under your desk when your back makes contact with the back of the chair. This allows you to work closely.  The chair's height should allow your eyes to be level with the upper part of your monitor and your hands to be slightly lower than your elbows. BODY POSITION  Your feet should make contact with the floor. If this is not possible, use a foot rest.  Keep your ears over your shoulders. This will reduce stress on your neck and low back. INCORRECT SITTING POSTURES   If you are feeling tired and unable to assume a healthy sitting posture, do not slouch or slump. This puts excessive strain on your back tissues, causing more damage and pain. Healthier options include:  Using more support, like a lumbar pillow.  Switching tasks to something that requires you to be upright or walking.  Talking a brief walk.  Lying down to rest in a neutral-spine position. PROLONGED STANDING WHILE SLIGHTLY LEANING FORWARD  When completing a task that requires you to lean forward while standing in one place for a long time, place either foot up on a stationary 2-4 inch high object to help maintain the best posture. When both feet are on  the ground, the low back tends to lose its slight inward curve. If this curve flattens (or becomes too large), then the back and your other joints will experience too much stress, fatigue more quickly and can cause pain.  CORRECT STANDING POSTURES Proper standing posture should be assumed with all daily activities, even if they only take a few moments, like when brushing your teeth. As in sitting, your ears should fall over your shoulders and your shoulders should fall over your hips. You should keep a slight tension in your abdominal  muscles to brace your spine. Your tailbone should point down to the ground, not behind your body, resulting in an over-extended swayback posture.  INCORRECT STANDING POSTURES  Common incorrect standing postures include a forward head, locked knees and/or an excessive swayback. WALKING Walk with an upright posture. Your ears, shoulders and hips should all line-up. PROLONGED ACTIVITY IN A FLEXED POSITION When completing a task that requires you to bend forward at your waist or lean over a low surface, try to find a way to stabilize 3 of 4 of your limbs. You can place a hand or elbow on your thigh or rest a knee on the surface you are reaching across. This will provide you more stability so that your muscles do not fatigue as quickly. By keeping your knees relaxed, or slightly bent, you will also reduce stress across your low back. CORRECT LIFTING TECHNIQUES DO :   Assume a wide stance. This will provide you more stability and the opportunity to get as close as possible to the object which you are lifting.  Tense your abdominals to brace your spine; then bend at the knees and hips. Keeping your back locked in a neutral-spine position, lift using your leg muscles. Lift with your legs, keeping your back straight.  Test the weight of unknown objects before attempting to lift them.  Try to keep your elbows locked down at your sides in order get the best strength from your shoulders when carrying an object.  Always ask for help when lifting heavy or awkward objects. INCORRECT LIFTING TECHNIQUES DO NOT:   Lock your knees when lifting, even if it is a small object.  Bend and twist. Pivot at your feet or move your feet when needing to change directions.  Assume that you cannot safely pick up a paperclip without proper posture. Document Released: 12/15/2005 Document Revised: 03/08/2012 Document Reviewed: 03/29/2009 [facility] Patient Information 2014 [place], [place].      [name]. Panosh  M.D.  Pre visit review using our clinic review tool, if applicable. No additional management support is needed unless otherwise documented below in the visit note.

## 2014-06-02 ENCOUNTER — Telehealth: Payer: Self-pay | Admitting: Internal Medicine

## 2014-06-02 MED ORDER — FLUCONAZOLE 150 MG PO TABS: 150.0000 mg | ORAL_TABLET | Freq: Once | ORAL | Status: DC

## 2014-06-02 NOTE — Telephone Encounter (Signed)
Pt states she thinks the meloxicam (MOBIC) 15 MG tablet has given her some sort of yeast inf, more itching than anything. Pt states she did the OTC for yeast, and her symptoms got better a day or to, but . returned. Pt would like to know if there's a med dr Regis Bill could prescribe. New pharm for pt: [facility]

## 2014-06-02 NOTE — Telephone Encounter (Signed)
dont think it is from the med but if thinks has yeast and not reponsive to  Monistat  Can rc diflucan 150 mg take 1 po x 1

## 2014-06-02 NOTE — Telephone Encounter (Signed)
Sent to the pharmacy by e-scribe.  Left a message at the below listed number for the pt to pick up rx at the pharmacy.

## 2014-06-23 ENCOUNTER — Other Ambulatory Visit: Payer: Self-pay | Admitting: Internal Medicine

## 2014-06-23 NOTE — Telephone Encounter (Signed)
Sent to the pharmacy by e-scribe. 

## 2014-06-23 NOTE — Telephone Encounter (Signed)
Ok to refill  X 1 

## 2014-08-21 ENCOUNTER — Other Ambulatory Visit: Payer: Self-pay | Admitting: Internal Medicine

## 2015-03-02 ENCOUNTER — Telehealth: Payer: Self-pay | Admitting: Family Medicine

## 2015-03-02 MED ORDER — MELOXICAM 15 MG PO TABS: ORAL_TABLET | ORAL | Status: DC

## 2015-03-02 MED ORDER — CYCLOBENZAPRINE HCL 5 MG PO TABS: 5.0000 mg | ORAL_TABLET | Freq: Three times a day (TID) | ORAL | Status: DC | PRN

## 2015-03-02 NOTE — Telephone Encounter (Signed)
Pt notified to pick up at the pharmacy.  She has made an appt for 03/05/15 for the back pain.

## 2015-03-02 NOTE — Telephone Encounter (Addendum)
Pt said she talk with [facility] and info was forward. I did  Not see info in my chart yet

## 2015-03-02 NOTE — Telephone Encounter (Signed)
Will check with WP to see if she will prescribe medication until pt can get into the office.  By the chart, looks like she has been seen for back pain before.

## 2015-03-02 NOTE — Telephone Encounter (Signed)
Pt called and left a message on my machine.  Is having back pain.  Needs to be triaged.  Please help the pt speak to [facility].

## 2015-03-02 NOTE — Telephone Encounter (Signed)
[facility] - Client [place] Patient Name: Crystal Mcintyre DOB: 1976-03-15 Initial Comment Caller states having back pain, asking for something to take Nurse Assessment Nurse: Mechele Dawley, RN, Amy Date/Time Eilene Ghazi Time): 03/02/2015 12:23:49 PM Confirm and document reason for call. If symptomatic, describe symptoms. ---She states that she hurt her back last year. She may twist or turn and hurt her lower part of her back. The only relief when she gets is when she is sitting on the toilet. When she gets up and straightens up she is okay. She states she has taken ibuprofen and nothing is working. She states that she is in pain. She has not been seen for this since last year. She says her pain level is a 10/10. She is wanting Dr. Regis Bill to just call her in something. Informed her that she will need to have an appt and that it will be up to Dr. Regis Bill if she will call her in something. Has the patient traveled out of the country within the last 30 days? ---Not Applicable Does the patient require triage? ---Yes Related visit to physician within the last 2 weeks? ---No Does the PT have any chronic conditions? (i.e. diabetes, asthma, etc.) ---No Did the patient indicate they were pregnant? ---No Guidelines Guideline Title Affirmed Question Affirmed Notes Back Pain [1] SEVERE back pain (e.g., excruciating, unable to do any normal activities) AND [2] not improved 2 hours after pain medicine Final Disposition User See Physician within 4 Hours (or PCP triage) [place], RN, Amy Comments caller was wanting something called in for pain. gave her the information to take ibuprofen. informed her would send the information over and it would be up to the md if something was called in or not.

## 2015-03-02 NOTE — Telephone Encounter (Signed)
Can refill the mobic  If she hasnt tried this  See emd list  And  Can try flexeril 5 mg  Max tid for muscle spasm  Disp 15  No refill   Until she can be seen .

## 2015-03-05 ENCOUNTER — Ambulatory Visit (INDEPENDENT_AMBULATORY_CARE_PROVIDER_SITE_OTHER): Payer: PRIVATE HEALTH INSURANCE | Admitting: Internal Medicine

## 2015-03-05 ENCOUNTER — Encounter: Payer: Self-pay | Admitting: Internal Medicine

## 2015-03-05 VITALS — BP 122/82 | Temp 98.3°F | Wt 173.9 lb

## 2015-03-05 DIAGNOSIS — M545 Low back pain: Secondary | ICD-10-CM

## 2015-03-05 MED ORDER — PREDNISONE 10 MG PO TABS: ORAL_TABLET | ORAL | Status: DC

## 2015-03-05 MED ORDER — TRAMADOL HCL 50 MG PO TABS: 50.0000 mg | ORAL_TABLET | Freq: Three times a day (TID) | ORAL | Status: DC | PRN

## 2015-03-05 NOTE — Patient Instructions (Addendum)
This   Acute strain .   We can add prednisone  To see if helps the pain.  Avoid sitting   Exercises  To continue . Will arrange referral to specialist .

## 2015-03-05 NOTE — Progress Notes (Signed)
Pre visit review using our clinic review tool, if applicable. No additional management support is needed unless otherwise documented below in the visit note.  Chief Complaint  Patient presents with  . Back Pain    HPI: Patient Crystal Mcintyre  comes in today for SDA for  new problem evaluation. Acute exacerbation of back pain  See past notes and phone notes  Recurrent back problems worse when sitting in chair   Did pt a year ago some help but knows these exercises  This pain is lasting longer than usual . No fever  Bowel or bladder  Changes.   Pain is still  Sig and interfere ing  Hard to get up and down from chairs .  No recent.njury  For a week or so and then worse.  ROS: See pertinent positives and negatives per HPI. No fever uti sx   Past Medical History  Diagnosis Date  . GOITER, MULTINODULAR 04/18/2009  . THYROID STIMULATING HORMONE, ABNORMAL 01/10/2009  . Other anxiety states 01/30/2010  . TOBACCO USE 01/10/2008  . IRREGULAR MENSTRUATION 06/17/2007  . Headache(784.0) 06/17/2007  . TRANSAMINASES, SERUM, ELEVATED 01/10/2009    Family History  Problem Relation Age of Onset  . Pneumonia Father   . Alcohol abuse Father   . Colon polyps Mother   . Breast cancer Paternal Aunt   . Diabetes Maternal Grandfather   . Prostate cancer Paternal Grandfather   . Rectal cancer Neg Hx   . Stomach cancer Neg Hx   . Colon cancer Neg Hx     History   Social History  . Marital Status: Married    Spouse Name: Crystal Mcintyre  . Number of Children: Crystal Mcintyre  . Years of Education: Crystal Mcintyre   Occupational History  . 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 Smoking status: Former Smoker    Quit date: 12/29/2008  . Smokeless tobacco: Never Used  . Alcohol Use: Yes     Comment: occasional  . Drug Use: No  . Sexual Activity:    Partners: Male   Other Topics Concern  . 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         Outpatient Encounter Prescriptions as of 03/05/2015  Medication Sig  . aspirin-acetaminophen-caffeine (EXCEDRIN MIGRAINE) 962-836-62 MG per tablet Take 2 tablets by mouth every 6 (six) hours as needed. For pain  . cyclobenzaprine (FLEXERIL) 5 MG tablet Take 1 tablet (5 mg total) by mouth 3 (three) times daily as needed for muscle spasms.  . meloxicam (MOBIC) 15 MG tablet TAKE 1 TABLET (15 MG TOTAL) BY MOUTH DAILY.  Marland Kitchen omeprazole (PRILOSEC) 20 MG capsule TAKE 1 CAPSULE TWICE A DAY  . Prenatal Vit-Fe Fumarate-FA (PRENATAL MULTIVITAMIN) TABS Take 1 tablet by mouth daily.  . predniSONE (DELTASONE) 10 MG tablet Take pills per day,6,6,6,4,4,4,2,2,2,1,1,1  . traMADol (ULTRAM) 50 MG tablet Take 1 tablet (50 mg total) by mouth every 8 (eight) hours as needed.  . [DISCONTINUED] acetaminophen (TYLENOL) 500 MG tablet Take 500 mg by mouth every 6 (six) hours as needed.  . [DISCONTINUED] fluconazole (DIFLUCAN) 150 MG tablet Take 1 tablet (150 mg total) by mouth once. May repeat in two days if necessary.  . [DISCONTINUED] methimazole (TAPAZOLE) 5 MG tablet Take 1 tablet (5 mg total) by mouth daily.    EXAM:  BP 122/82 mmHg  Temp(Src) 98.3 F (36.8  C) (Oral)  Wt 173 lb 14.4 oz (78.881 kg)  Body mass index is 29.15 kg/(m^2).  GENERAL: vitals reviewed and listed above, alert, oriented, appears well hydrated  In moderated distress  Hard to get up and down chair  Avoiding left buttock sitting  walsk  Antalgic HEENT: atraumatic, conjunctiva  clear, no obvious abnormalities on inspection of external nose and ears NECK: no obvious masses on inspection palpation   non focal motor  No rash  No mid line spot tenderness  MS: moves all extremities without noticeable focal  abnormality PSYCH: pleasant and cooperative, no obvious depression or anxiety  ASSESSMENT AND PLAN:  Discussed the following assessment and plan:  Left low back pain, with sciatica presence unspecified  Low back pain, unspecified back  pain laterality, with sciatica presence unspecified - RECURRENT   over this year  did pt and exercise  radr ad to buttocks  worse this timedisc back hygiene  pred  shoirt term tgramadol and ortho referral  - Plan: Ambulatory referral to Orthopedic Surgery Acute back pain   Waxing and waning    Advise further evaluation for management sig interference  Trial prednisone and  Small amount tramadol .    Expectant management.  -Patient advised to return or notify health care team  if symptoms worsen ,persist or new concerns arise.  Patient Instructions  This   Acute strain .   We can add prednisone  To see if helps the pain.  Avoid sitting   Exercises  To continue . Will arrange referral to specialist .   [name]. Panosh M.D.

## 2015-03-09 ENCOUNTER — Encounter: Payer: Self-pay | Admitting: Endocrinology

## 2015-03-09 ENCOUNTER — Ambulatory Visit (INDEPENDENT_AMBULATORY_CARE_PROVIDER_SITE_OTHER): Payer: PRIVATE HEALTH INSURANCE | Admitting: Endocrinology

## 2015-03-09 VITALS — BP 110/68 | HR 80 | Temp 98.5°F | Wt 168.0 lb

## 2015-03-09 DIAGNOSIS — E042 Nontoxic multinodular goiter: Secondary | ICD-10-CM

## 2015-03-09 LAB — T4, FREE: FREE T4: 0.71 ng/dL (ref 0.60–1.60)

## 2015-03-09 LAB — TSH: TSH: 0.56 u[IU]/mL (ref 0.35–4.50)

## 2015-03-09 NOTE — Patient Instructions (Addendum)
blood tests are being requested for you today.  We'll let you know about the results. Let's recheck the ultrasound.  you will receive a phone call, about a day and time for an appointment. Please return in 6 months.

## 2015-03-09 NOTE — Progress Notes (Signed)
Pre visit review using our clinic review tool, if applicable. No additional management support is needed unless otherwise documented below in the visit note. 

## 2015-03-09 NOTE — Progress Notes (Signed)
Subjective:    Patient ID: Crystal Mcintyre, female    DOB: [birth year], 39 y.o.   MRN: [id number]  HPI Pt returns for f/u of small multinodular goiter (dx'ed 2010; nodules were not big enough to need bx.  She would like to start a pregnancy soon, but has not conceived yet. In 2014-2015, she took tapazole x 6 months for a suppressed TSH).  pt states she feels well in general.  She does not notice the goiter.  Past Medical History  Diagnosis Date  . GOITER, MULTINODULAR 04/18/2009  . THYROID STIMULATING HORMONE, ABNORMAL 01/10/2009  . Other anxiety states 01/30/2010  . TOBACCO USE 01/10/2008  . IRREGULAR MENSTRUATION 06/17/2007  . Headache(784.0) 06/17/2007  . TRANSAMINASES, SERUM, ELEVATED 01/10/2009    Past Surgical History  Procedure Laterality Date  . Tympanostomy tube placement  as child  . Tonsillectomy  as child  . Tympanoplasty  4 yrs ago  . Upper gastrointestinal endoscopy      History   Social History  . Marital Status: Married    Spouse Name: N/A  . Number of Children: N/A  . Years of Education: N/A   Occupational History  . 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 Smokeless tobacco: Never Used  . Alcohol Use: Yes     Comment: occasional  . Drug Use: No  . Sexual Activity:    Partners: Male   Other Topics Concern  . 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          Current Outpatient Prescriptions on File Prior to Visit  Medication Sig Dispense Refill  . aspirin-acetaminophen-caffeine (EXCEDRIN MIGRAINE) 250-250-65 MG per tablet Take 2 tablets by mouth every 6 (six) hours as needed. For pain    . meloxicam (MOBIC) 15 MG tablet TAKE 1 TABLET (15 MG TOTAL) BY MOUTH DAILY. 30 tablet 0  . omeprazole (PRILOSEC) 20 MG capsule TAKE 1 CAPSULE TWICE A DAY 60 capsule 1  . predniSONE (DELTASONE) 10 MG tablet Take pills  per day,6,6,6,4,4,4,2,2,2,1,1,1 40 tablet 0  . Prenatal Vit-Fe Fumarate-FA (PRENATAL MULTIVITAMIN) TABS Take 1 tablet by mouth daily.    . traMADol (ULTRAM) 50 MG tablet Take 1 tablet (50 mg total) by mouth every 8 (eight) hours as needed. 24 tablet 0   No current facility-administered medications on file prior to visit.    No Known Allergies  Family History  Problem Relation Age of Onset  . Pneumonia Father   . Alcohol abuse Father   . Colon polyps Mother   . Breast cancer Paternal Aunt   . Diabetes Maternal Grandfather   . Prostate cancer Paternal Grandfather   . Rectal cancer Neg Hx   . Stomach cancer Neg Hx   . Colon cancer Neg Hx     BP 110/68 mmHg  Pulse 80  Temp(Src) 98.5 F (36.9 C) (Oral)  Wt 168 lb (76.204 kg)  Review of Systems She has reg menses.     Objective:   Physical Exam VITAL SIGNS:  See vs page GENERAL: no distress head: no deformity eyes: no periorbital swelling; slight bilat proptosis. Neck: thyroid is slightly enlarged (R>L).  The surface is irregular.     Lab Results  Component Value Date   TSH 0.56 03/09/2015  Assessment & Plan:  Multinodular goiter, small Hyperthyroidism: better now, but she is at risk for recurrence.  Patient is advised the following: Patient Instructions  blood tests are being requested for you today.  We'll let you know about the results. Let's recheck the ultrasound.  you will receive a phone call, about a day and time for an appointment. Please return in 6 months.

## 2015-03-20 ENCOUNTER — Other Ambulatory Visit: Payer: PRIVATE HEALTH INSURANCE

## 2015-03-23 ENCOUNTER — Ambulatory Visit
Admission: RE | Admit: 2015-03-23 | Discharge: 2015-03-23 | Disposition: A | Discharge status: Home / Self Care | Payer: PRIVATE HEALTH INSURANCE | Source: Ambulatory Visit | Attending: Endocrinology | Admitting: Endocrinology

## 2015-03-23 DIAGNOSIS — E042 Nontoxic multinodular goiter: Secondary | ICD-10-CM

## 2015-05-07 ENCOUNTER — Other Ambulatory Visit (HOSPITAL_COMMUNITY): Payer: Self-pay | Admitting: Obstetrics and Gynecology

## 2015-05-07 DIAGNOSIS — N979 Female infertility, unspecified: Secondary | ICD-10-CM

## 2015-05-10 ENCOUNTER — Ambulatory Visit (HOSPITAL_COMMUNITY)
Admission: RE | Admit: 2015-05-10 | Discharge: 2015-05-10 | Disposition: A | Discharge status: Home / Self Care | Payer: PRIVATE HEALTH INSURANCE | Source: Ambulatory Visit | Attending: Obstetrics and Gynecology | Admitting: Obstetrics and Gynecology

## 2015-05-10 DIAGNOSIS — N979 Female infertility, unspecified: Secondary | ICD-10-CM | POA: Insufficient documentation

## 2015-05-10 MED ORDER — IOHEXOL 300 MG/ML  SOLN
30.0000 mL | Freq: Once | INTRAMUSCULAR | Status: AC | PRN
  Administered 2015-05-10: 30 mL

## 2015-05-16 ENCOUNTER — Encounter: Payer: Self-pay | Admitting: Internal Medicine

## 2015-05-16 NOTE — Progress Notes (Signed)
Document opened and reviewed for am OV but appt  canceled same day .

## 2015-05-29 ENCOUNTER — Ambulatory Visit (INDEPENDENT_AMBULATORY_CARE_PROVIDER_SITE_OTHER): Payer: PRIVATE HEALTH INSURANCE | Admitting: Internal Medicine

## 2015-05-29 NOTE — Progress Notes (Signed)
Document opened and reviewed forvisit . No showed . For second time  .

## 2015-06-26 ENCOUNTER — Telehealth: Payer: Self-pay | Admitting: Endocrinology

## 2015-06-26 NOTE — Telephone Encounter (Signed)
See note below and please advise, Thanks! 

## 2015-06-26 NOTE — Telephone Encounter (Signed)
Thank you.  i would be happy to address at ov here, but if it has no symptoms, is not really high,and you do not want a pregnancy, it needs no treatment.

## 2015-06-26 NOTE — Telephone Encounter (Signed)
Patient called stating that her prolactin is elevated and she would like for Dr. Loanne Drilling to be aware  Also, does Dr. Loanne Drilling need for a return visit or U/S  Please advise patient   Thank you

## 2015-06-27 NOTE — Telephone Encounter (Signed)
Left voicemail advising of note below. Requested call back if the pt would like to discuss,

## 2015-09-10 ENCOUNTER — Ambulatory Visit: Payer: PRIVATE HEALTH INSURANCE | Admitting: Endocrinology

## 2015-09-14 ENCOUNTER — Encounter: Payer: Self-pay | Admitting: Endocrinology

## 2015-09-14 ENCOUNTER — Ambulatory Visit (INDEPENDENT_AMBULATORY_CARE_PROVIDER_SITE_OTHER): Payer: PRIVATE HEALTH INSURANCE | Admitting: Endocrinology

## 2015-09-14 VITALS — BP 120/72 | HR 83 | Temp 98.1°F | Ht 64.75 in | Wt 168.0 lb

## 2015-09-14 DIAGNOSIS — E221 Hyperprolactinemia: Secondary | ICD-10-CM | POA: Diagnosis not present

## 2015-09-14 DIAGNOSIS — E042 Nontoxic multinodular goiter: Secondary | ICD-10-CM | POA: Diagnosis not present

## 2015-09-14 LAB — TSH: TSH: 0.74 u[IU]/mL (ref 0.35–4.50)

## 2015-09-14 NOTE — Patient Instructions (Signed)
blood tests are requested for you today.  We'll let you know about the results. If the prolactin is high again, you should keep it down with medication. I agree with Dr Regis Bill that you should avoid pregnancy until you are finished with the spironolactone. Please come back for a follow-up appointment in 6 months

## 2015-09-14 NOTE — Progress Notes (Signed)
Subjective:    Patient ID: Crystal Mcintyre, female    DOB: [birth year], 39 y.o.   MRN: [id number]  HPI Pt returns for f/u of small multinodular goiter (dx'ed 2010; nodules were not big enough to need bx.  She would like to start a pregnancy soon, but has not conceived yet. In 2014-2015, she took tapazole x 6 months for a suppressed TSH).  pt states she feels well in general.  She does not notice the goiter.  She was recently found to have an elevated prolactin, for which she took medication for a brief time.  She was also started on aldactone for a slight rash on the chest, and assoc itching.  Past Medical History  Diagnosis Date  . GOITER, MULTINODULAR 04/18/2009  . THYROID STIMULATING HORMONE, ABNORMAL 01/10/2009  . Other anxiety states 01/30/2010  . TOBACCO USE 01/10/2008  . IRREGULAR MENSTRUATION 06/17/2007  . Headache(784.0) 06/17/2007  . TRANSAMINASES, SERUM, ELEVATED 01/10/2009    Past Surgical History  Procedure Laterality Date  . Tympanostomy tube placement  as child  . Tonsillectomy  as child  . Tympanoplasty  4 yrs ago  . Upper gastrointestinal endoscopy      Social History   Social History  . Marital Status: Married    Spouse Name: N/A  . Number of Children: N/A  . Years of Education: N/A   Occupational History  . 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 Smokeless tobacco: Never Used  . Alcohol Use: Yes     Comment: occasional  . Drug Use: No  . Sexual Activity:    Partners: Male   Other Topics Concern  . 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          Current Outpatient Prescriptions on File Prior to Visit  Medication Sig Dispense Refill  . aspirin-acetaminophen-caffeine (EXCEDRIN MIGRAINE) 250-250-65 MG per tablet Take 2 tablets by mouth every 6 (six) hours as needed. For pain    . omeprazole  (PRILOSEC) 20 MG capsule TAKE 1 CAPSULE TWICE A DAY 60 capsule 1  . Prenatal Vit-Fe Fumarate-FA (PRENATAL MULTIVITAMIN) TABS Take 1 tablet by mouth daily.    . meloxicam (MOBIC) 15 MG tablet TAKE 1 TABLET (15 MG TOTAL) BY MOUTH DAILY. (Patient not taking: Reported on 09/14/2015) 30 tablet 0   No current facility-administered medications on file prior to visit.    No Known Allergies  Family History  Problem Relation Age of Onset  . Pneumonia Father   . Alcohol abuse Father   . Colon polyps Mother   . Breast cancer Paternal Aunt   . Diabetes Maternal Grandfather   . Prostate cancer Paternal Grandfather   . Rectal cancer Neg Hx   . Stomach cancer Neg Hx   . Colon cancer Neg Hx     BP 120/72 mmHg  Pulse 83  Temp(Src) 98.1 F (36.7 C) (Oral)  Ht 5' 4.75" (1.645 m)  Wt 168 lb (76.204 kg)  BMI 28.16 kg/m2  SpO2 98%  Review of Systems She has reg menses, but heavy.  No weight change    Objective:   Physical Exam VITAL SIGNS:  See vs page GENERAL: no distress head: no deformity eyes: no periorbital swelling; slight bilat proptosis.  Neck: thyroid is 3-4 times normal size (R>L).  The surface is irregular, but no discreet nodule is palpable.     Lab Results  Component Value Date   TSH 0.74 09/14/2015  prolactin=normal    Assessment & Plan:  Rash, uncertain etiology, on aldactone Hyperthyroidism: has not yet recurred.  We'll follow. Hyperprolactinemia: resolved, but we should recheck in the future, as this can impair fertility.   Patient is advised the following: Patient Instructions  blood tests are requested for you today.  We'll let you know about the results. If the prolactin is high again, you should keep it down with medication. I agree with Dr Regis Bill that you should avoid pregnancy until you are finished with the spironolactone. Please come back for a follow-up appointment in 6 months

## 2015-09-15 LAB — PROLACTIN: Prolactin: 15.5 ng/mL

## 2016-01-31 ENCOUNTER — Other Ambulatory Visit: Payer: Self-pay | Admitting: Specialist

## 2016-01-31 DIAGNOSIS — G8929 Other chronic pain: Secondary | ICD-10-CM

## 2016-01-31 DIAGNOSIS — R51 Headache: Principal | ICD-10-CM

## 2016-01-31 DIAGNOSIS — R519 Headache, unspecified: Secondary | ICD-10-CM

## 2016-02-08 ENCOUNTER — Ambulatory Visit
Admission: RE | Admit: 2016-02-08 | Discharge: 2016-02-08 | Disposition: A | Discharge status: Home / Self Care | Payer: BLUE CROSS/BLUE SHIELD | Source: Ambulatory Visit | Attending: Specialist | Admitting: Specialist

## 2016-02-08 DIAGNOSIS — R51 Headache: Principal | ICD-10-CM

## 2016-02-08 DIAGNOSIS — G8929 Other chronic pain: Secondary | ICD-10-CM

## 2016-02-08 DIAGNOSIS — R519 Headache, unspecified: Secondary | ICD-10-CM

## 2016-03-12 NOTE — Patient Instructions (Addendum)
blood tests are requested for you today.  We'll let you know about the results.  If the prolactin is even high-normal, we'll try to suppress it again.  Please come back for a follow-up appointment in 6 months.

## 2016-03-12 NOTE — Progress Notes (Signed)
Subjective:    Patient ID: Crystal Mcintyre, female    DOB: Dec 27, 1976, 40 y.o.   MRN: [id number]  HPI  The state of at least three ongoing medical problems is addressed today, with interval history of each noted here: Pt returns for f/u of small multinodular goiter (dx'ed 2010; nodules were not big enough to need bx.  She would like to start a pregnancy soon, but has not conceived yet. In 2014-2015, she took tapazole x 6 months for a suppressed TSH).  She does not notice the goiter.  She was recently found to have an elevated prolactin, for which she took medication for a brief time.  Recheck off medication was normal.  Denies galactorrhea. Infertility: she has irreg menses.   Past Medical History  Diagnosis Date  . GOITER, MULTINODULAR 04/18/2009  . THYROID STIMULATING HORMONE, ABNORMAL 01/10/2009  . Other anxiety states 01/30/2010  . TOBACCO USE 01/10/2008  . IRREGULAR MENSTRUATION 06/17/2007  . Headache(784.0) 06/17/2007  . TRANSAMINASES, SERUM, ELEVATED 01/10/2009    Past Surgical History  Procedure Laterality Date  . Tympanostomy tube placement  as child  . Tonsillectomy  as child  . Tympanoplasty  4 yrs ago  . Upper gastrointestinal endoscopy      Social History   Social History  . Marital Status: Married    Spouse Name: N/A  . Number of Children: N/A  . Years of Education: N/A   Occupational History  . 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 Smokeless tobacco: Never Used  . Alcohol Use: Yes     Comment: occasional  . Drug Use: No  . Sexual Activity:    Partners: Male   Other Topics Concern  . 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          Current Outpatient Prescriptions on File Prior to Visit  Medication Sig Dispense Refill  . omeprazole (PRILOSEC) 20 MG capsule TAKE 1 CAPSULE TWICE A DAY 60 capsule 1    . Prenatal Vit-Fe Fumarate-FA (PRENATAL MULTIVITAMIN) TABS Take 1 tablet by mouth daily.     No current facility-administered medications on file prior to visit.   No Known Allergies  Family History  Problem Relation Age of Onset  . Pneumonia Father   . Alcohol abuse Father   . Colon polyps Mother   . Breast cancer Paternal Aunt   . Diabetes Maternal Grandfather   . Prostate cancer Paternal Grandfather   . Rectal cancer Neg Hx   . Stomach cancer Neg Hx   . Colon cancer Neg Hx     BP 118/74 mmHg  Pulse 71  Temp(Src) 97.9 F (36.6 C) (Oral)  Ht 5' 4.75" (1.645 m)  Wt 171 lb 6.4 oz (77.747 kg)  BMI 28.73 kg/m2  SpO2 98%  LMP 02/14/2016  Review of Systems Denies weight change and dry skin    Objective:   Physical Exam VITAL SIGNS:  See vs page GENERAL: no distress eyes: no periorbital swelling; slight bilat proptosis.  Neck: thyroid is 3 times normal size (R>L).  The surface is irregular, but no discreet nodule is palpable.    Lab Results  Component Value Date   TSH 1.00 03/13/2016  prolactin=18    Assessment & Plan:  Multinodular goiter, clinically stable Hyperthyroidism, due to  the goiter: has not yet recurred Hyperprolactinemia: has not yet recurred Infertility: in view of high-normal prolactin, we'll try to suppress.   Patient is advised the following: Patient Instructions  blood tests are requested for you today.  We'll let you know about the results.  If the prolactin is even high-normal, we'll try to suppress it again.  Please come back for a follow-up appointment in 6 months.    addendum: i have sent a prescription to your pharmacy, for parlodel.  No need to recheck, as it is normal prior to rx.

## 2016-03-13 ENCOUNTER — Ambulatory Visit (INDEPENDENT_AMBULATORY_CARE_PROVIDER_SITE_OTHER): Payer: BLUE CROSS/BLUE SHIELD | Admitting: Endocrinology

## 2016-03-13 ENCOUNTER — Encounter: Payer: Self-pay | Admitting: Endocrinology

## 2016-03-13 ENCOUNTER — Telehealth: Payer: Self-pay | Admitting: Endocrinology

## 2016-03-13 VITALS — BP 118/74 | HR 71 | Temp 97.9°F | Ht 64.75 in | Wt 171.4 lb

## 2016-03-13 DIAGNOSIS — E221 Hyperprolactinemia: Secondary | ICD-10-CM

## 2016-03-13 DIAGNOSIS — E0590 Thyrotoxicosis, unspecified without thyrotoxic crisis or storm: Secondary | ICD-10-CM | POA: Diagnosis not present

## 2016-03-13 LAB — TSH: TSH: 1 u[IU]/mL (ref 0.35–4.50)

## 2016-03-13 MED ORDER — METOCLOPRAMIDE HCL 10 MG PO TABS: 10.0000 mg | ORAL_TABLET | Freq: Four times a day (QID) | ORAL | Status: DC | PRN

## 2016-03-14 LAB — PROLACTIN: PROLACTIN: 17.8 ng/mL

## 2016-03-14 MED ORDER — BROMOCRIPTINE MESYLATE 2.5 MG PO TABS: 1.2500 mg | ORAL_TABLET | Freq: Every day | ORAL | Status: DC

## 2016-08-30 IMAGING — RF DG HYSTEROGRAM
6 series · 6 of 6 positions shown · IV contrast (omnipaque)
Comparison: None.

CLINICAL DATA: Primary female infertility.

EXAM:
HYSTEROSALPINGOGRAM
TECHNIQUE: Following cleansing of the cervix and vagina with Betadine solution,
a hysterosalpingogram was performed using a 5-French
hysterosalpingogram catheter and Omnipaque 300 contrast. The patient
tolerated the examination without difficulty.

[Series 1: run · 1 of 1 slices shown (1 of 6)]
[im 1/1]
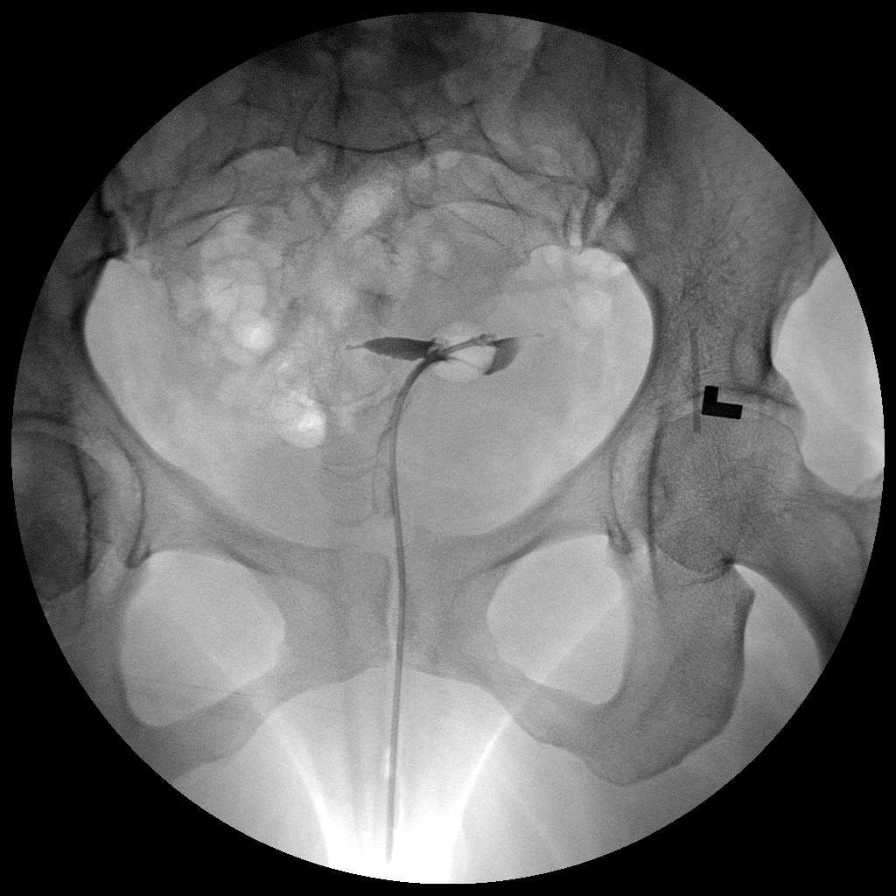

[Series 2: run · 1 of 1 slices shown (2 of 6)]
[im 1/1]
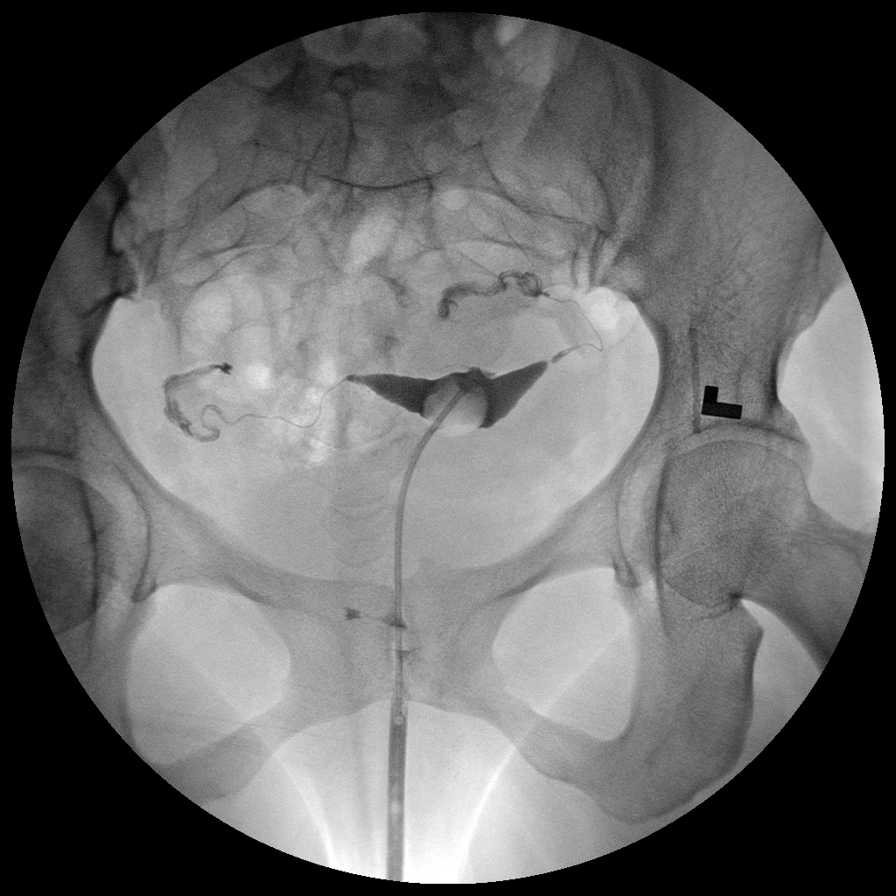

[Series 3: run · 1 of 1 slices shown (3 of 6)]
[im 1/1]
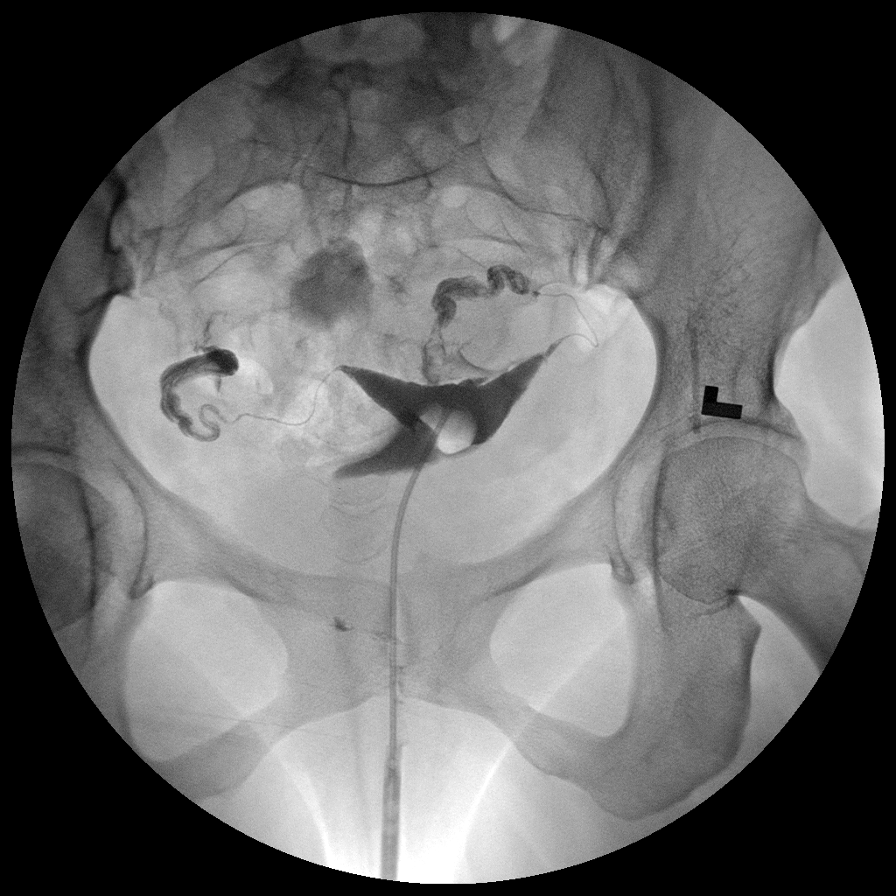

[Series 4: run · 1 of 1 slices shown (4 of 6)]
[im 1/1]
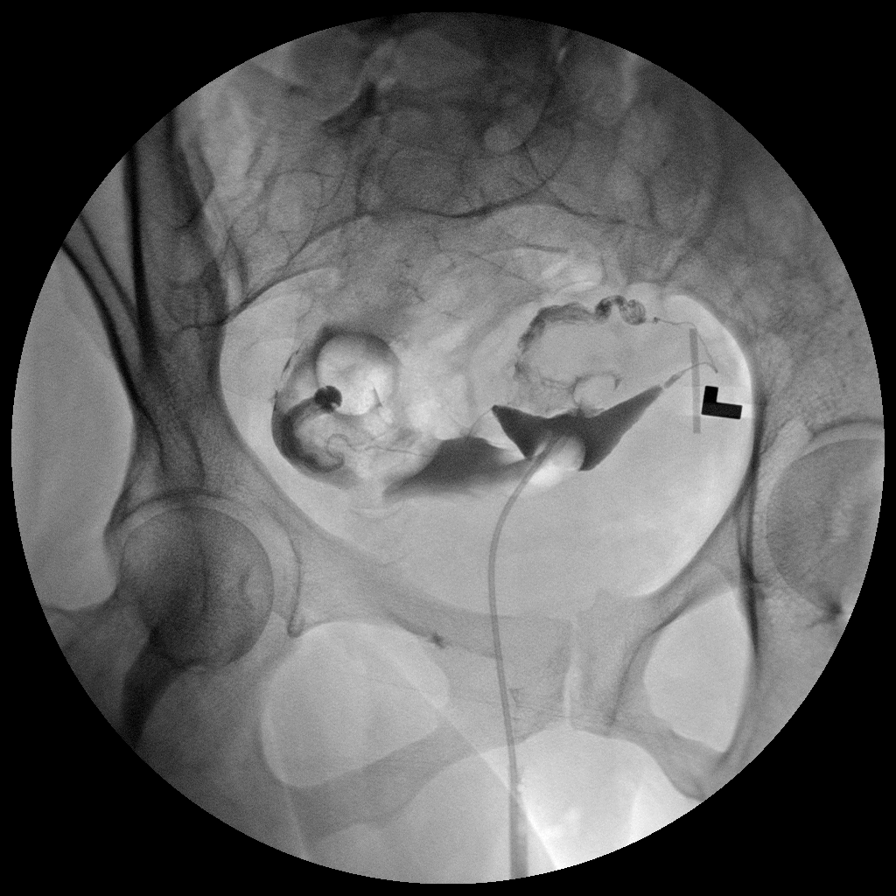

[Series 5: run · 1 of 1 slices shown (5 of 6)]
[im 1/1]
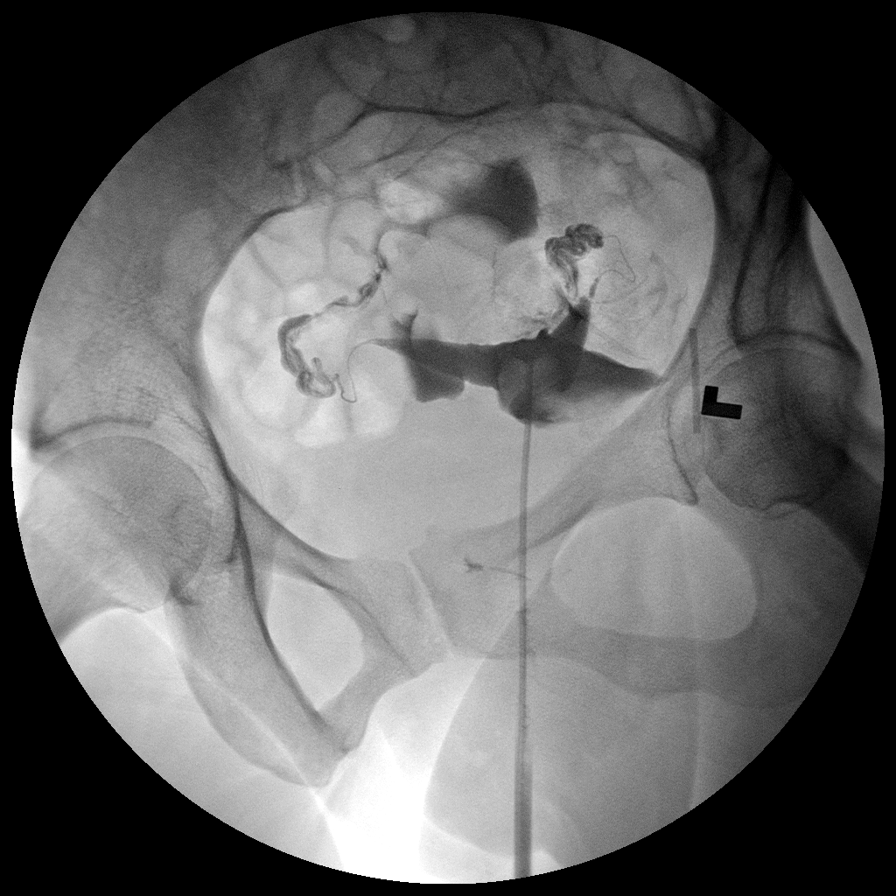

[Series 6: run · 1 of 1 slices shown (6 of 6)]
[im 1/1]
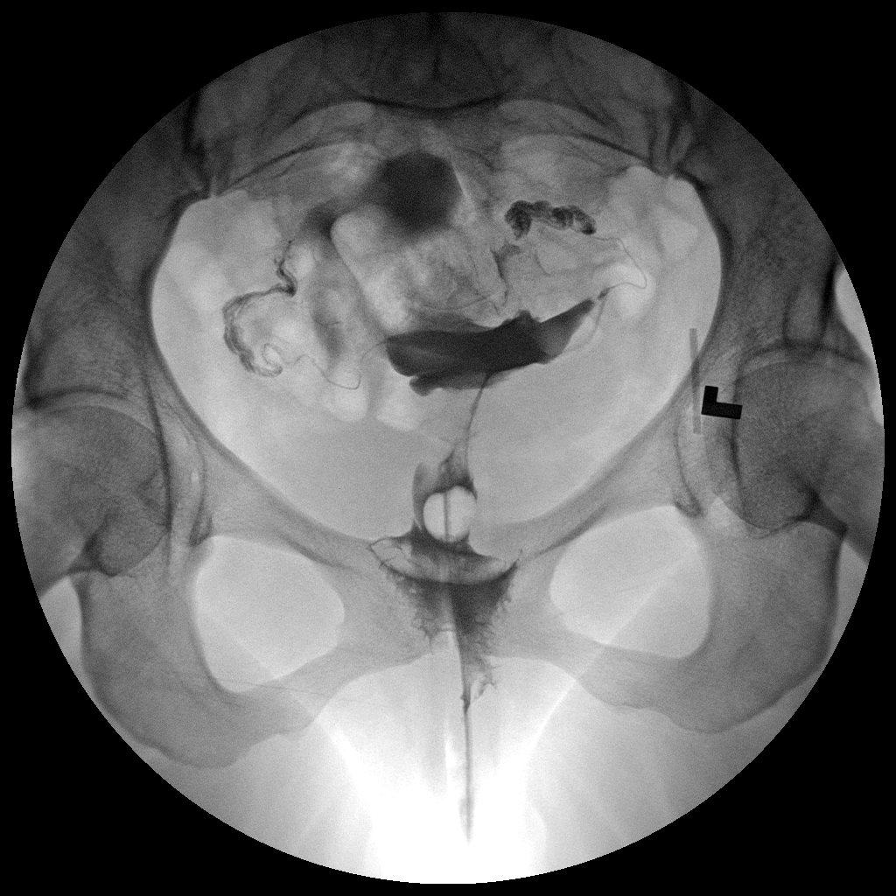

[6 of 6 positions shown; findings below may reference images not displayed]

FLUOROSCOPY TIME:  Fluoroscopy Time:  0 minutes 48 seconds

Number of Acquired Images:  6
FINDINGS: Endometrial Cavity: Normal appearance. No signs of Mullerian duct
anomaly or other significant abnormality.

Right Fallopian Tube: Well opacified and normal in appearance. Free
intraperitoneal spill of contrast is demonstrated.

Left Fallopian Tube: Well opacified and normal in appearance. Free
intraperitoneal spill of contrast is demonstrated.

Other:  None.
IMPRESSION: Normal study.  Both fallopian tubes are patent.

## 2016-09-17 ENCOUNTER — Encounter: Payer: Self-pay | Admitting: Endocrinology

## 2016-09-17 ENCOUNTER — Ambulatory Visit (INDEPENDENT_AMBULATORY_CARE_PROVIDER_SITE_OTHER): Payer: BLUE CROSS/BLUE SHIELD | Admitting: Endocrinology

## 2016-09-17 VITALS — BP 110/70 | HR 70 | Ht 64.75 in | Wt 178.0 lb

## 2016-09-17 DIAGNOSIS — E221 Hyperprolactinemia: Secondary | ICD-10-CM | POA: Diagnosis not present

## 2016-09-17 DIAGNOSIS — E042 Nontoxic multinodular goiter: Secondary | ICD-10-CM

## 2016-09-17 LAB — TSH: TSH: 1.07 u[IU]/mL (ref 0.35–4.50)

## 2016-09-17 NOTE — Progress Notes (Signed)
Subjective:    Patient ID: Crystal Mcintyre, female    DOB: [birth year], 40 y.o.   MRN: [id number]  HPI The state of at least three ongoing medical problems is addressed today, with interval history of each noted here:  Pt returns for f/u of small multinodular goiter (dx'ed 2010; nodules were not big enough to need bx; f/u US in 2016 was not significantly changed In 2014-2015, she took tapazole x 6 months for a suppressed TSH).  She does not notice the goiter.  Hyperprolactinemia: (dx'ed 2016; she took parlodel for a brief time in 2016; it was resumed in 2017 for a high-normal prolactin; MRI was normal in 2017).  Denies galactorrhea.  She has not recently taken the parlodel.   Infertility: she started injections 2 mos ago. No pregnancy yet. Past Medical History:  Diagnosis Date  . GOITER, MULTINODULAR 04/18/2009  . Headache(784.0) 06/17/2007  . IRREGULAR MENSTRUATION 06/17/2007  . Other anxiety states 01/30/2010  . THYROID STIMULATING HORMONE, ABNORMAL 01/10/2009  . TOBACCO USE 01/10/2008  . TRANSAMINASES, SERUM, ELEVATED 01/10/2009    Past Surgical History:  Procedure Laterality Date  . TONSILLECTOMY  as child  . TYMPANOPLASTY  4 yrs ago  . TYMPANOSTOMY TUBE PLACEMENT  as child  . UPPER GASTROINTESTINAL ENDOSCOPY      Social History   Social History  . Marital status: Married    Spouse name: N/A  . Number of children: N/A  . Years of education: N/A   Occupational History  . 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 Smoking status: Former Smoker    Quit date: 12/29/2008  . Smokeless tobacco: Never Used  . Alcohol use Yes     Comment: occasional  . Drug use: No  . Sexual activity: Yes    Partners: Male   Other Topics Concern  . 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          Current Outpatient Prescriptions on File Prior to Visit  Medication Sig Dispense Refill    . metoCLOPramide (REGLAN) 10 MG tablet Take 1 tablet (10 mg total) by mouth every 6 (six) hours as needed for nausea. 50 tablet 11  . omeprazole (PRILOSEC) 20 MG capsule TAKE 1 CAPSULE TWICE A DAY 60 capsule 1  . Prenatal Vit-Fe Fumarate-FA (PRENATAL MULTIVITAMIN) TABS Take 1 tablet by mouth daily.    . bromocriptine (PARLODEL) 2.5 MG tablet Take 0.5 tablets (1.25 mg total) by mouth at bedtime. (Patient not taking: Reported on 09/17/2016) 15 tablet 11   No current facility-administered medications on file prior to visit.     No Known Allergies  Family History  Problem Relation Age of Onset  . Pneumonia Father   . Alcohol abuse Father   . Colon polyps Mother   . Breast cancer Paternal Aunt   . Diabetes Maternal Grandfather   . Prostate cancer Paternal Grandfather   . Rectal cancer Neg Hx   . Stomach cancer Neg Hx   . Colon cancer Neg Hx    BP 110/70   Pulse 70   Ht 5' 4.75" (1.645 m)   Wt 178 lb (80.7 kg)   SpO2 98%   BMI 29.85 kg/m   Review of Systems  She has gained weight    Objective:   Physical Exam VITAL SIGNS:  See vs page GENERAL: no distress eyes:  no periorbital swelling; slight bilat proptosis.  Neck: thyroid is 3 times normal size (R>L).  The surface is irregular, but no discrete nodule is palpable.   Lab Results  Component Value Date   TSH 1.07 09/17/2016      Assessment & Plan:  Multinodular goiter: clinically stable.   Hyperprolactinemia: due for recheck.   Infertility: in view of this, she should resume parlodel for even a high-normal prolactin.

## 2016-09-17 NOTE — Patient Instructions (Addendum)
Let;s recheck the ultrasound.  Please see a specialist.  you will receive a phone call, about a day and time for an appointment blood tests are requested for you today.  We'll let you know about the results.  Please come back for a follow-up appointment in 6 months.

## 2016-09-18 ENCOUNTER — Other Ambulatory Visit: Payer: Self-pay | Admitting: Endocrinology

## 2016-09-18 LAB — PROLACTIN: PROLACTIN: 17.2 ng/mL

## 2016-09-18 MED ORDER — BROMOCRIPTINE MESYLATE 2.5 MG PO TABS: 1.2500 mg | ORAL_TABLET | Freq: Every day | ORAL | 11 refills | Status: DC

## 2016-10-14 ENCOUNTER — Other Ambulatory Visit: Payer: 59

## 2016-10-23 ENCOUNTER — Ambulatory Visit
Admission: RE | Admit: 2016-10-23 | Discharge: 2016-10-23 | Disposition: A | Discharge status: Home / Self Care | Payer: No Typology Code available for payment source | Source: Ambulatory Visit | Attending: Endocrinology | Admitting: Endocrinology

## 2016-10-23 DIAGNOSIS — E042 Nontoxic multinodular goiter: Secondary | ICD-10-CM

## 2017-03-17 ENCOUNTER — Ambulatory Visit (INDEPENDENT_AMBULATORY_CARE_PROVIDER_SITE_OTHER): Payer: No Typology Code available for payment source | Admitting: Endocrinology

## 2017-03-17 ENCOUNTER — Encounter: Payer: Self-pay | Admitting: Endocrinology

## 2017-03-17 VITALS — BP 128/84 | HR 85 | Ht 64.75 in | Wt 174.0 lb

## 2017-03-17 DIAGNOSIS — E221 Hyperprolactinemia: Secondary | ICD-10-CM | POA: Diagnosis not present

## 2017-03-17 DIAGNOSIS — E042 Nontoxic multinodular goiter: Secondary | ICD-10-CM

## 2017-03-17 DIAGNOSIS — E0590 Thyrotoxicosis, unspecified without thyrotoxic crisis or storm: Secondary | ICD-10-CM | POA: Diagnosis not present

## 2017-03-17 LAB — TSH: TSH: 1.13 u[IU]/mL (ref 0.35–4.50)

## 2017-03-17 NOTE — Progress Notes (Signed)
Subjective:    Patient ID: Crystal Mcintyre, female    DOB: [birth year], 41 y.o.   MRN: [id number]  HPI The state of at least three ongoing medical problems is addressed today, with interval history of each noted here:  Pt returns for f/u of small multinodular goiter (dx'ed 2010; nodules were not big enough to need bx; f/u US in 2014-2017 we not significantly changed; she took tapazole x 6 months for a suppressed TSH).  She does not notice the goiter.  Hyperprolactinemia: (dx'ed 2016; she took parlodel for a brief time in 2016; it was resumed in 2017 for a high-normal prolactin; MRI was normal in 2017).  Denies galactorrhea.  She takes the parlodel as rx'ed.  Infertility: she started injections 2 mos ago. No pregnancy yet. She has reg menses. Past Medical History:  Diagnosis Date  . GOITER, MULTINODULAR 04/18/2009  . Headache(784.0) 06/17/2007  . IRREGULAR MENSTRUATION 06/17/2007  . Other anxiety states 01/30/2010  . THYROID STIMULATING HORMONE, ABNORMAL 01/10/2009  . TOBACCO USE 01/10/2008  . TRANSAMINASES, SERUM, ELEVATED 01/10/2009    Past Surgical History:  Procedure Laterality Date  . TONSILLECTOMY  as child  . TYMPANOPLASTY  4 yrs ago  . TYMPANOSTOMY TUBE PLACEMENT  as child  . UPPER GASTROINTESTINAL ENDOSCOPY      Social History   Social History  . Marital status: Married    Spouse name: N/A  . Number of children: N/A  . Years of education: N/A   Occupational History  . 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 Smoking status: Former Smoker    Quit date: 12/29/2008  . Smokeless tobacco: Never Used  . Alcohol use Yes     Comment: occasional  . Drug use: No  . Sexual activity: Yes    Partners: Male   Other Topics Concern  . 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          Current Outpatient Prescriptions on File Prior to Visit  Medication Sig Dispense Refill    . bromocriptine (PARLODEL) 2.5 MG tablet Take 0.5 tablets (1.25 mg total) by mouth at bedtime. 15 tablet 11  . metoCLOPramide (REGLAN) 10 MG tablet Take 1 tablet (10 mg total) by mouth every 6 (six) hours as needed for nausea. 50 tablet 11  . omeprazole (PRILOSEC) 20 MG capsule TAKE 1 CAPSULE TWICE A DAY 60 capsule 1  . Prenatal Vit-Fe Fumarate-FA (PRENATAL MULTIVITAMIN) TABS Take 1 tablet by mouth daily.     No current facility-administered medications on file prior to visit.     No Known Allergies  Family History  Problem Relation Age of Onset  . Pneumonia Father   . Alcohol abuse Father   . Colon polyps Mother   . Breast cancer Paternal Aunt   . Diabetes Maternal Grandfather   . Prostate cancer Paternal Grandfather   . Rectal cancer Neg Hx   . Stomach cancer Neg Hx   . Colon cancer Neg Hx     BP 128/84   Pulse 85   Ht 5' 4.75" (1.645 m)   Wt 174 lb (78.9 kg)   SpO2 95%   BMI 29.18 kg/m    Review of Systems Denies palpitations and tremor    Objective:   Physical Exam VITAL SIGNS:  See vs page GENERAL: no distress eyes: no periorbital swelling; slight bilat  proptosis.  Neck: thyroid is 3 times normal size (R>L).  The surface is irregular, but no nodule is palpable.       Assessment & Plan:  Hyperprolactinemia: due for recheck Infertility: Goal prolactin is low-normal. goiter: due for recheck of TSH  Patient is advised the following: Patient Instructions  most of the time, a "lumpy thyroid" will eventually become overactive again.  this is usually a slow process, happening over the span of many years. blood tests are requested for you today.  We'll let you know about the results.  Please come back for a follow-up appointment in 6 months.

## 2017-03-17 NOTE — Patient Instructions (Signed)
most of the time, a "lumpy thyroid" will eventually become overactive again.  this is usually a slow process, happening over the span of many years. blood tests are requested for you today.  We'll let you know about the results.  Please come back for a follow-up appointment in 6 months.

## 2017-03-18 ENCOUNTER — Other Ambulatory Visit: Payer: Self-pay | Admitting: Endocrinology

## 2017-03-18 LAB — PROLACTIN: Prolactin: 14.2 ng/mL

## 2017-03-18 MED ORDER — BROMOCRIPTINE MESYLATE 2.5 MG PO TABS: 2.5000 mg | ORAL_TABLET | Freq: Every day | ORAL | 11 refills | Status: DC

## 2017-07-14 NOTE — Progress Notes (Deleted)
No chief complaint on file.   HPI: Patient  Crystal Mcintyre  41 y.o. comes in today for Preventive Health Care visit  Followed by dr Loanne Drilling for MNG  And elevated PL in past  Health Maintenance  Topic Date Due  . PAP SMEAR  08/26/2016  . INFLUENZA VACCINE  07/29/2017  . TETANUS/TDAP  12/04/2018  . HIV Screening  Completed   Health Maintenance Review LIFESTYLE:  Exercise:   Tobacco/ETS: Alcohol:  Sugar beverages: Sleep: Drug use: no HH of  Work:    ROS:  GEN/ HEENT: No fever, significant weight changes sweats headaches vision problems hearing changes, CV/ PULM; No chest pain shortness of breath cough, syncope,edema  change in exercise tolerance. GI /GU: No adominal pain, vomiting, change in bowel habits. No blood in the stool. No significant GU symptoms. SKIN/HEME: ,no acute skin rashes suspicious lesions or bleeding. No lymphadenopathy, nodules, masses.  NEURO/ PSYCH:  No neurologic signs such as weakness numbness. No depression anxiety. IMM/ Allergy: No unusual infections.  Allergy .   REST of 12 system review negative except as per HPI   Past Medical History:  Diagnosis Date  . GOITER, MULTINODULAR 04/18/2009  . Headache(784.0) 06/17/2007  . IRREGULAR MENSTRUATION 06/17/2007  . Other anxiety states 01/30/2010  . THYROID STIMULATING HORMONE, ABNORMAL 01/10/2009  . TOBACCO USE 01/10/2008  . TRANSAMINASES, SERUM, ELEVATED 01/10/2009    Past Surgical History:  Procedure Laterality Date  . TONSILLECTOMY  as child  . TYMPANOPLASTY  4 yrs ago  . TYMPANOSTOMY TUBE PLACEMENT  as child  . UPPER GASTROINTESTINAL ENDOSCOPY      Family History  Problem Relation Age of Onset  . Pneumonia Father   . Alcohol abuse Father   . Colon polyps Mother   . Breast cancer Paternal Aunt   . Diabetes Maternal Grandfather   . Prostate cancer Paternal Grandfather   . Rectal cancer Neg Hx   . Stomach cancer Neg Hx   . Colon cancer Neg Hx     Social History   Social History  .  Marital status: Married    Spouse name: N/A  . Number of children: N/A  . Years of education: N/A   Occupational History  . 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 Smoking status: Former Smoker    Quit date: 12/29/2008  . Smokeless tobacco: Never Used  . Alcohol use Yes     Comment: occasional  . Drug use: No  . Sexual activity: Yes    Partners: Male   Other Topics Concern  . 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          Outpatient Medications Prior to Visit  Medication Sig Dispense Refill  . bromocriptine (PARLODEL) 2.5 MG tablet Take 1 tablet (2.5 mg total) by mouth at bedtime. 30 tablet 11  . metoCLOPramide (REGLAN) 10 MG tablet Take 1 tablet (10 mg total) by mouth every 6 (six) hours as needed for nausea. 50 tablet 11  . omeprazole (PRILOSEC) 20 MG capsule TAKE 1 CAPSULE TWICE A DAY 60 capsule 1  . Prenatal Vit-Fe Fumarate-FA (PRENATAL MULTIVITAMIN) TABS Take 1 tablet by mouth daily.     No facility-administered medications prior to visit.      EXAM:  There were no vitals taken for this visit.  There is no height or weight on file  to calculate BMI. Wt Readings from Last 3 Encounters:  03/17/17 174 lb (78.9 kg)  09/17/16 178 lb (80.7 kg)  03/13/16 171 lb 6.4 oz (77.7 kg)    Physical Exam: Vital signs reviewed [id number] is a well-developed well-nourished alert cooperative    who appearsr stated age in no acute distress.  HEENT: normocephalic atraumatic , Eyes: PERRL EOM's full, conjunctiva clear, Nares: paten,t no deformity discharge or tenderness., Ears: no deformity EAC's clear TMs with normal landmarks. Mouth: clear OP, no lesions, edema.  Moist mucous membranes. Dentition in adequate repair. NECK: supple without masses, thyromegaly or bruits. CHEST/PULM:  Clear to auscultation and percussion breath sounds equal no wheeze , rales or rhonchi. No chest  wall deformities or tenderness. Breast: normal by inspection . No dimpling, discharge, masses, tenderness or discharge . CV: PMI is nondisplaced, S1 S2 no gallops, murmurs, rubs. Peripheral pulses are full without delay.No JVD .  ABDOMEN: Bowel sounds normal nontender  No guard or rebound, no hepato splenomegal no CVA tenderness.  No hernia. Extremtities:  No clubbing cyanosis or edema, no acute joint swelling or redness no focal atrophy NEURO:  Oriented x3, cranial nerves 3-12 appear to be intact, no obvious focal weakness,gait within normal limits no abnormal reflexes or asymmetrical SKIN: No acute rashes normal turgor, color, no bruising or petechiae. PSYCH: Oriented, good eye contact, no obvious depression anxiety, cognition and judgment appear normal. LN: no cervical axillary inguinal adenopathy  Lab Results  Component Value Date   WBC 4.0 (L) 08/19/2013   HGB 12.7 08/19/2013   HCT 38.0 08/19/2013   PLT 313.0 08/19/2013   GLUCOSE 77 08/19/2013   CHOL 144 08/19/2013   TRIG 50.0 08/19/2013   HDL 49.50 08/19/2013   LDLCALC 85 08/19/2013   ALT 28 08/19/2013   AST 23 08/19/2013   NA 139 08/19/2013   K 3.7 08/19/2013   CL 107 08/19/2013   CREATININE 0.6 08/19/2013   BUN 9 08/19/2013   CO2 30 08/19/2013   TSH 1.13 03/17/2017    BP Readings from Last 3 Encounters:  03/17/17 128/84  09/17/16 110/70  03/13/16 118/74    Lab results reviewed with patient   ASSESSMENT AND PLAN:  Discussed the following assessment and plan:  Visit for preventive health examination  Patient Care Team: Burnis Medin, MD as PCP - General Renato Shin, MD (Endocrinology) Michel Santee, MD (Neurology) Sheralyn Boatman, MD (Psychiatry) There are no Patient Instructions on file for this visit.  [name]. Panosh M.D.

## 2017-07-15 ENCOUNTER — Encounter: Payer: No Typology Code available for payment source | Admitting: Internal Medicine

## 2017-09-17 ENCOUNTER — Ambulatory Visit: Payer: No Typology Code available for payment source | Admitting: Endocrinology

## 2017-09-18 ENCOUNTER — Encounter: Payer: Self-pay | Admitting: Internal Medicine

## 2017-09-29 ENCOUNTER — Ambulatory Visit (INDEPENDENT_AMBULATORY_CARE_PROVIDER_SITE_OTHER): Payer: No Typology Code available for payment source | Admitting: Endocrinology

## 2017-09-29 ENCOUNTER — Encounter: Payer: Self-pay | Admitting: Endocrinology

## 2017-09-29 VITALS — BP 112/80 | HR 86 | Wt 180.2 lb

## 2017-09-29 DIAGNOSIS — E042 Nontoxic multinodular goiter: Secondary | ICD-10-CM | POA: Diagnosis not present

## 2017-09-29 DIAGNOSIS — E221 Hyperprolactinemia: Secondary | ICD-10-CM

## 2017-09-29 LAB — TSH: TSH: 2.36 u[IU]/mL (ref 0.35–4.50)

## 2017-09-29 NOTE — Progress Notes (Signed)
Subjective:    Patient ID: Crystal Mcintyre, female    DOB: 07-24-1976, 41 y.o.   MRN: [id number]  HPI Pt returns for f/u of small multinodular goiter (dx'ed 2010; nodules were not big enough to need bx; f/u US in 2014-2017 we not significantly changed; she took tapazole x 6 months for a suppressed TSH, but ).  Hyperprolactinemia: (dx'ed 2016; she took parlodel for a brief time in 2016; it was resumed in 2017 for a high-normal prolactin; MRI was normal in 2017).  She takes the parlodel as rx'ed.  Pt reports slightly increased swelling at the right ant neck, but no assoc pain.   Past Medical History:  Diagnosis Date  . GOITER, MULTINODULAR 04/18/2009  . Headache(784.0) 06/17/2007  . IRREGULAR MENSTRUATION 06/17/2007  . Other anxiety states 01/30/2010  . THYROID STIMULATING HORMONE, ABNORMAL 01/10/2009  . TOBACCO USE 01/10/2008  . TRANSAMINASES, SERUM, ELEVATED 01/10/2009    Past Surgical History:  Procedure Laterality Date  . TONSILLECTOMY  as child  . TYMPANOPLASTY  4 yrs ago  . TYMPANOSTOMY TUBE PLACEMENT  as child  . UPPER GASTROINTESTINAL ENDOSCOPY      Social History   Social History  . Marital status: Married    Spouse name: N/A  . Number of children: N/A  . Years of education: N/A   Occupational History  . 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 Smoking status: Former Smoker    Quit date: 12/29/2008  . Smokeless tobacco: Never Used  . Alcohol use Yes     Comment: occasional  . Drug use: No  . Sexual activity: Yes    Partners: Male   Other Topics Concern  . 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          Current Outpatient Prescriptions on File Prior to Visit  Medication Sig Dispense Refill  . bromocriptine (PARLODEL) 2.5 MG tablet Take 1 tablet (2.5 mg total) by mouth at bedtime. 30 tablet 11  . omeprazole (PRILOSEC) 20 MG capsule TAKE 1 CAPSULE TWICE A  DAY 60 capsule 1  . Prenatal Vit-Fe Fumarate-FA (PRENATAL MULTIVITAMIN) TABS Take 1 tablet by mouth daily.     No current facility-administered medications on file prior to visit.     No Known Allergies  Family History  Problem Relation Age of Onset  . Pneumonia Father   . Alcohol abuse Father   . Colon polyps Mother   . Breast cancer Paternal Aunt   . Diabetes Maternal Grandfather   . Prostate cancer Paternal Grandfather   . Rectal cancer Neg Hx   . Stomach cancer Neg Hx   . Colon cancer Neg Hx     BP 112/80   Pulse 86   Wt 180 lb 3.2 oz (81.7 kg)   SpO2 97%   BMI 30.22 kg/m    Review of Systems She has reg menses.  No galactorrhea.    Objective:   Physical Exam VITAL SIGNS:  See vs page GENERAL: no distress eyes: no periorbital swelling; slight bilat proptosis.  Neck: thyroid is 5 times normal size (R>L).  The surface is irregular, but no nodule is palpable.       Assessment & Plan:  Goiter: clinically worse. Hyperprolactinemia: due for recheck  Patient Instructions  Let's recheck the ultrasound.  you will receive a phone call, about a day  and time for an appointment. most of the time, a "lumpy thyroid" will eventually become overactive again.  this is usually a slow process, happening over the span of many years. blood tests are requested for you today.  We'll let you know about the results.  Please come back for a follow-up appointment in 6 months.

## 2017-09-29 NOTE — Patient Instructions (Addendum)
Let's recheck the ultrasound.  you will receive a phone call, about a day and time for an appointment. most of the time, a "lumpy thyroid" will eventually become overactive again.  this is usually a slow process, happening over the span of many years. blood tests are requested for you today.  We'll let you know about the results.  Please come back for a follow-up appointment in 6 months.

## 2017-09-30 LAB — PROLACTIN: Prolactin: 11.6 ng/mL

## 2017-10-13 ENCOUNTER — Telehealth: Payer: Self-pay | Admitting: Endocrinology

## 2017-10-13 NOTE — Telephone Encounter (Signed)
Patient is waiting to hear about appointment getting scheduled for ultrasound. It has been approx. 2 weeks.

## 2017-10-14 NOTE — Telephone Encounter (Signed)
[facility] & they were giving patient a call to set up [place] since that was her preferred place to have it done.

## 2017-10-23 NOTE — Progress Notes (Signed)
Chief Complaint  Patient presents with  . Annual Exam    pap today. Pt reports back back from Scoliosis    HPI: Patient  Crystal Mcintyre  41 y.o. comes in today for [place] visit Has gyne do procedure for fertility last time about a year ago but we have been doing her Pap smears last one in 2014 she has pretty regular periods they are heavy but last 5 days.  Sees dr Loanne Drilling for goiter getting an ultrasound soon and he has checked her TSH.   Health Maintenance  Topic Date Due  . PAP SMEAR  08/26/2016  . TETANUS/TDAP  12/04/2018  . INFLUENZA VACCINE  Completed  . HIV Screening  Completed   Health Maintenance Review LIFESTYLE:  Exercise:   Active at job  No sedentery  Tobacco/ETS:  ets husband cigars  Alcohol:   1-2 per week  Sugar beverages: Sleep: crazy like always been  Using melatonin  Drug use: no HH of  2  Pet dog  Work:   82 days straight about 80 in a week.  Grandmother in her 17s recently passed away grandfather still living 65 years of marriage.  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  Feels like her breasts are more full. GEN/ HEENT: No fever, significant weight changes sweats headaches vision problems hearing changes, CV/ PULM; No chest pain shortness of breath cough, syncope,edema  change in exercise tolerance. GI /GU: No adominal pain, vomiting, change in bowel habits. No blood in the stool. No significant GU symptoms. SKIN/HEME: ,no acute skin rashes suspicious lesions or bleeding. No lymphadenopathy, nodules, masses.  NEURO/ PSYCH:  No neurologic signs such as weakness numbness. No depression anxiety. IMM/ Allergy: No unusual infections.  Allergy .   REST of 12 system review negative except as per HPI   Past Medical History:  Diagnosis Date    . GOITER, MULTINODULAR 04/18/2009  . Headache(784.0) 06/17/2007  . IRREGULAR MENSTRUATION 06/17/2007  . Other anxiety states 01/30/2010  . THYROID STIMULATING HORMONE, ABNORMAL 01/10/2009  . TOBACCO USE 01/10/2008  . TRANSAMINASES, SERUM, ELEVATED 01/10/2009    Past Surgical History:  Procedure Laterality Date  . TONSILLECTOMY  as child  . TYMPANOPLASTY  4 yrs ago  . TYMPANOSTOMY TUBE PLACEMENT  as child  . UPPER GASTROINTESTINAL ENDOSCOPY      Family History  Problem Relation Age of Onset  . Pneumonia Father   . Alcohol abuse Father   . Colon polyps Mother   . Breast cancer Paternal Aunt   . Diabetes Maternal Grandfather   . Prostate cancer Paternal Grandfather   . Rectal cancer Neg Hx   . Stomach cancer Neg Hx   . Colon cancer Neg Hx     Social History   Social History  . Marital status: Married    Spouse name: N/A  . Number of children: N/A  . Years of education: N/A   Occupational History  . Intake Coordinator at [employer]  . Smoking status: Former Smoker    Quit date: 12/29/2008  . Smokeless tobacco: Never Used  . Alcohol use Yes     Comment: occasional  . Drug use: No  . Sexual activity: Yes    Partners:  Male   Other Topics Concern  . None   Social History Narrative   HH of 2   Pet dog   [employer]   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          Outpatient Medications Prior to Visit  Medication Sig Dispense Refill  . bromocriptine (PARLODEL) 2.5 MG tablet Take 1 tablet (2.5 mg total) by mouth at bedtime. 30 tablet 11  . omeprazole (PRILOSEC) 20 MG capsule TAKE 1 CAPSULE TWICE A DAY 60 capsule 1  . Prenatal Vit-Fe Fumarate-FA (PRENATAL MULTIVITAMIN) TABS Take 1 tablet by mouth daily.     No facility-administered medications prior to visit.      EXAM:  BP 106/78 (BP Location: Right Arm, Patient Position: Sitting, Cuff Size: Normal)   Pulse 81   Temp  98.2 F (36.8 C) (Oral)   Ht 5' 4"  (1.626 m)   Wt 178 lb (80.7 kg)   BMI 30.55 kg/m   Body mass index is 30.55 kg/m. Wt Readings from Last 3 Encounters:  10/27/17 178 lb (80.7 kg)  09/29/17 180 lb 3.2 oz (81.7 kg)  03/17/17 174 lb (78.9 kg)    Physical Exam: Vital signs reviewed GXQ:JJHE is a well-developed well-nourished alert cooperative    who appearsr stated age in no acute distress.  HEENT: normocephalic atraumatic , Eyes: PERRL EOM's full, conjunctiva clear, Nares: paten,t no deformity discharge or tenderness., Ears: no deformity EAC's clear TMs with normal landmarks. Mouth: clear OP, no lesions, edema.  Moist mucous membranes. Dentition in adequate repair. NECK: supple without masses,  or bruits.  Slightly irregular thyromegaly.  Nontender CHEST/PULM:  Clear to auscultation and percussion breath sounds equal no wheeze , rales or rhonchi. No chest wall deformities or tenderness. Breast: normal by inspection . No dimpling, discharge, masses, tenderness or discharge . CV: PMI is nondisplaced, S1 S2 no gallops, murmurs, rubs. Peripheral pulses are full without delay.No JVD .  ABDOMEN: Bowel sounds normal nontender  No guard or rebound, no hepato splenomegal no CVA tenderness.  No hernia. Extremtities:  No clubbing cyanosis or edema, no acute joint swelling or redness no focal atrophy NEURO:  Oriented x3, cranial nerves 3-12 appear to be intact, no obvious focal weakness,gait within normal limits no abnormal reflexes or asymmetrical SKIN: No acute rashes normal turgor, color, no bruising or petechiae. PSYCH: Oriented, good eye contact, no obvious depression anxiety, cognition and judgment appear normal. LN: no cervical axillary inguinal adenopathy Pelvic: NL ext GU, labia clear without lesions or rash . Vagina no lesions .Cervix: clear  UTERUS: Neg CMT enlarged slightly irregular fibroid uterus adnexa:  clear no masses . PAP done with HPV high risk screen.   BP Readings from Last 3  Encounters:  10/27/17 106/78  09/29/17 112/80  03/17/17 128/84    Lab results reviewed with patient   ASSESSMENT AND PLAN:  Discussed the following assessment and plan:  Visit for preventive health examination - Plan: Basic metabolic panel, CBC with Differential/Platelet, Hepatic function panel, Lipid panel, Cytology - PAP  Encounter for gynecological examination without abnormal finding - Plan: Cytology - PAP  GOITER, MULTINODULAR  Need for lipid screening - Plan: Lipid panel  Uterine leiomyoma, unspecified location  Patient Care Team: Burnis Medin, MD as PCP - General Renato Shin, MD (Endocrinology) Michel Santee, MD (Neurology) Servando Salina, MD as Consulting Physician (Obstetrics and Gynecology) Patient Instructions  Will notify you  of labs when available.  Attend  to healthy life style   Take time for your self and relationships.     Preventive Care 40-64 Years, Female Preventive care refers to lifestyle choices and visits with your health care provider that can promote health and wellness. What does preventive care include?  A yearly physical exam. This is also called an annual well check.  Dental exams once or twice a year.  Routine eye exams. Ask your health care provider how often you should have your eyes checked.  Personal lifestyle choices, including: ? Daily care of your teeth and gums. ? Regular physical activity. ? Eating a healthy diet. ? Avoiding tobacco and drug use. ? Limiting alcohol use. ? Practicing safe sex. ? Taking low-dose aspirin daily starting at age 84. ? Taking vitamin and mineral supplements as recommended by your health care provider. What happens during an annual well check? The services and screenings done by your health care provider during your annual well check will depend on your age, overall health, lifestyle risk factors, and family history of disease. Counseling Your health care provider may ask you questions  about your:  Alcohol use.  Tobacco use.  Drug use.  Emotional well-being.  Home and relationship well-being.  Sexual activity.  Eating habits.  Work and work Statistician.  Method of birth control.  Menstrual cycle.  Pregnancy history.  Screening You may have the following tests or measurements:  Height, weight, and BMI.  Blood pressure.  Lipid and cholesterol levels. These may be checked every 5 years, or more frequently if you are over 40 years old.  Skin check.  Lung cancer screening. You may have this screening every year starting at age 72 if you have a 30-pack-year history of smoking and currently smoke or have quit within the past 15 years.  Fecal occult blood test (FOBT) of the stool. You may have this test every year starting at age 68.  Flexible sigmoidoscopy or colonoscopy. You may have a sigmoidoscopy every 5 years or a colonoscopy every 10 years starting at age 84.  Hepatitis C blood test.  Hepatitis B blood test.  Sexually transmitted disease (STD) testing.  Diabetes screening. This is done by checking your blood sugar (glucose) after you have not eaten for a while (fasting). You may have this done every 1-3 years.  Mammogram. This may be done every 1-2 years. Talk to your health care provider about when you should start having regular mammograms. This may depend on whether you have a family history of breast cancer.  BRCA-related cancer screening. This may be done if you have a family history of breast, ovarian, tubal, or peritoneal cancers.  Pelvic exam and Pap test. This may be done every 3 years starting at age 76. Starting at age 76, this may be done every 5 years if you have a Pap test in combination with an HPV test.  Bone density scan. This is done to screen for osteoporosis. You may have this scan if you are at high risk for osteoporosis.  Discuss your test results, treatment options, and if necessary, the need for more tests with your  health care provider. Vaccines Your health care provider may recommend certain vaccines, such as:  Influenza vaccine. This is recommended every year.  Tetanus, diphtheria, and acellular pertussis (Tdap, Td) vaccine. You may need a Td booster every 10 years.  Varicella vaccine. You may need this if you have not been vaccinated.  Zoster vaccine. You may need this after age 50.  Measles, mumps,  and rubella (MMR) vaccine. You may need at least one dose of MMR if you were born in 1957 or later. You may also need a second dose.  Pneumococcal 13-valent conjugate (PCV13) vaccine. You may need this if you have certain conditions and were not previously vaccinated.  Pneumococcal polysaccharide (PPSV23) vaccine. You may need one or two doses if you smoke cigarettes or if you have certain conditions.  Meningococcal vaccine. You may need this if you have certain conditions.  Hepatitis A vaccine. You may need this if you have certain conditions or if you travel or work in places where you may be exposed to hepatitis A.  Hepatitis B vaccine. You may need this if you have certain conditions or if you travel or work in places where you may be exposed to hepatitis B.  Haemophilus influenzae type b (Hib) vaccine. You may need this if you have certain conditions.  Talk to your health care provider about which screenings and vaccines you need and how often you need them. This information is not intended to replace advice given to you by your health care provider. Make sure you discuss any questions you have with your health care provider. Document Released: 01/11/2016 Document Revised: 09/03/2016 Document Reviewed: 10/16/2015 Elsevier Interactive Patient Education  2017 [place] [employer]. Oveda Dadamo M.D.

## 2017-10-27 ENCOUNTER — Encounter: Payer: Self-pay | Admitting: Internal Medicine

## 2017-10-27 ENCOUNTER — Ambulatory Visit (INDEPENDENT_AMBULATORY_CARE_PROVIDER_SITE_OTHER): Payer: No Typology Code available for payment source | Admitting: Internal Medicine

## 2017-10-27 ENCOUNTER — Other Ambulatory Visit (HOSPITAL_COMMUNITY)
Admission: RE | Admit: 2017-10-27 | Discharge: 2017-10-27 | Disposition: A | Discharge status: Home / Self Care | Payer: No Typology Code available for payment source | Source: Ambulatory Visit | Attending: Internal Medicine | Admitting: Internal Medicine

## 2017-10-27 VITALS — BP 106/78 | HR 81 | Temp 98.2°F | Ht 64.0 in | Wt 178.0 lb

## 2017-10-27 DIAGNOSIS — Z13220 Encounter for screening for lipoid disorders: Secondary | ICD-10-CM | POA: Diagnosis not present

## 2017-10-27 DIAGNOSIS — E042 Nontoxic multinodular goiter: Secondary | ICD-10-CM

## 2017-10-27 DIAGNOSIS — Z0000 Encounter for general adult medical examination without abnormal findings: Secondary | ICD-10-CM

## 2017-10-27 DIAGNOSIS — Z01419 Encounter for gynecological examination (general) (routine) without abnormal findings: Secondary | ICD-10-CM

## 2017-10-27 DIAGNOSIS — D259 Leiomyoma of uterus, unspecified: Secondary | ICD-10-CM

## 2017-10-27 LAB — LIPID PANEL
CHOL/HDL RATIO: 3
Cholesterol: 175 mg/dL (ref 0–200)
HDL: 57.5 mg/dL (ref >39.00)
LDL CALC: 107 mg/dL — AB (ref 0–99)
NonHDL: 117.59
TRIGLYCERIDES: 54 mg/dL (ref 0.0–149.0)
VLDL: 10.8 mg/dL (ref 0.0–40.0)

## 2017-10-27 LAB — CBC WITH DIFFERENTIAL/PLATELET
Basophils Absolute: 0 10*3/uL (ref 0.0–0.1)
Basophils Relative: 0.4 % (ref 0.0–3.0)
EOS ABS: 0 10*3/uL (ref 0.0–0.7)
EOS PCT: 1.1 % (ref 0.0–5.0)
HCT: 37.4 % (ref 36.0–46.0)
Hemoglobin: 12.1 g/dL (ref 12.0–15.0)
LYMPHS ABS: 1.4 10*3/uL (ref 0.7–4.0)
Lymphocytes Relative: 33.1 % (ref 12.0–46.0)
MCHC: 32.4 g/dL (ref 30.0–36.0)
MCV: 87.4 fl (ref 78.0–100.0)
MONO ABS: 0.5 10*3/uL (ref 0.1–1.0)
Monocytes Relative: 12.3 % — ABNORMAL HIGH (ref 3.0–12.0)
NEUTROS PCT: 53.1 % (ref 43.0–77.0)
Neutro Abs: 2.3 10*3/uL (ref 1.4–7.7)
Platelets: 265 10*3/uL (ref 150.0–400.0)
RBC: 4.28 Mil/uL (ref 3.87–5.11)
RDW: 15.2 % (ref 11.5–15.5)
WBC: 4.3 10*3/uL (ref 4.0–10.5)

## 2017-10-27 LAB — BASIC METABOLIC PANEL
BUN: 10 mg/dL (ref 6–23)
CO2: 28 mEq/L (ref 19–32)
CREATININE: 0.59 mg/dL (ref 0.40–1.20)
Calcium: 9.6 mg/dL (ref 8.4–10.5)
Chloride: 104 mEq/L (ref 96–112)
GFR: 143.19 mL/min (ref >60.00)
GLUCOSE: 79 mg/dL (ref 70–99)
POTASSIUM: 4.2 meq/L (ref 3.5–5.1)
Sodium: 137 mEq/L (ref 135–145)

## 2017-10-27 LAB — HEPATIC FUNCTION PANEL
ALT: 11 U/L (ref 0–35)
AST: 14 U/L (ref 0–37)
Albumin: 4.5 g/dL (ref 3.5–5.2)
Alkaline Phosphatase: 66 U/L (ref 39–117)
BILIRUBIN DIRECT: 0.1 mg/dL (ref 0.0–0.3)
Total Bilirubin: 0.4 mg/dL (ref 0.2–1.2)
Total Protein: 7 g/dL (ref 6.0–8.3)

## 2017-10-27 NOTE — Patient Instructions (Signed)
Will notify you  of labs when available.  Attend to healthy life style   Take time for your self and relationships.     Preventive Care 40-64 Years, Female Preventive care refers to lifestyle choices and visits with your health care provider that can promote health and wellness. What does preventive care include?  A yearly physical exam. This is also called an annual well check.  Dental exams once or twice a year.  Routine eye exams. Ask your health care provider how often you should have your eyes checked.  Personal lifestyle choices, including: ? Daily care of your teeth and gums. ? Regular physical activity. ? Eating a healthy diet. ? Avoiding tobacco and drug use. ? Limiting alcohol use. ? Practicing safe sex. ? Taking low-dose aspirin daily starting at age 71. ? Taking vitamin and mineral supplements as recommended by your health care provider. What happens during an annual well check? The services and screenings done by your health care provider during your annual well check will depend on your age, overall health, lifestyle risk factors, and family history of disease. Counseling Your health care provider may ask you questions about your:  Alcohol use.  Tobacco use.  Drug use.  Emotional well-being.  Home and relationship well-being.  Sexual activity.  Eating habits.  Work and work Statistician.  Method of birth control.  Menstrual cycle.  Pregnancy history.  Screening You may have the following tests or measurements:  Height, weight, and BMI.  Blood pressure.  Lipid and cholesterol levels. These may be checked every 5 years, or more frequently if you are over 73 years old.  Skin check.  Lung cancer screening. You may have this screening every year starting at age 101 if you have a 30-pack-year history of smoking and currently smoke or have quit within the past 15 years.  Fecal occult blood test (FOBT) of the stool. You may have this test every  year starting at age 42.  Flexible sigmoidoscopy or colonoscopy. You may have a sigmoidoscopy every 5 years or a colonoscopy every 10 years starting at age 59.  Hepatitis C blood test.  Hepatitis B blood test.  Sexually transmitted disease (STD) testing.  Diabetes screening. This is done by checking your blood sugar (glucose) after you have not eaten for a while (fasting). You may have this done every 1-3 years.  Mammogram. This may be done every 1-2 years. Talk to your health care provider about when you should start having regular mammograms. This may depend on whether you have a family history of breast cancer.  BRCA-related cancer screening. This may be done if you have a family history of breast, ovarian, tubal, or peritoneal cancers.  Pelvic exam and Pap test. This may be done every 3 years starting at age 64. Starting at age 73, this may be done every 5 years if you have a Pap test in combination with an HPV test.  Bone density scan. This is done to screen for osteoporosis. You may have this scan if you are at high risk for osteoporosis.  Discuss your test results, treatment options, and if necessary, the need for more tests with your health care provider. Vaccines Your health care provider may recommend certain vaccines, such as:  Influenza vaccine. This is recommended every year.  Tetanus, diphtheria, and acellular pertussis (Tdap, Td) vaccine. You may need a Td booster every 10 years.  Varicella vaccine. You may need this if you have not been vaccinated.  Zoster vaccine. You  may need this after age 45.  Measles, mumps, and rubella (MMR) vaccine. You may need at least one dose of MMR if you were born in 1957 or later. You may also need a second dose.  Pneumococcal 13-valent conjugate (PCV13) vaccine. You may need this if you have certain conditions and were not previously vaccinated.  Pneumococcal polysaccharide (PPSV23) vaccine. You may need one or two doses if you smoke  cigarettes or if you have certain conditions.  Meningococcal vaccine. You may need this if you have certain conditions.  Hepatitis A vaccine. You may need this if you have certain conditions or if you travel or work in places where you may be exposed to hepatitis A.  Hepatitis B vaccine. You may need this if you have certain conditions or if you travel or work in places where you may be exposed to hepatitis B.  Haemophilus influenzae type b (Hib) vaccine. You may need this if you have certain conditions.  Talk to your health care provider about which screenings and vaccines you need and how often you need them. This information is not intended to replace advice given to you by your health care provider. Make sure you discuss any questions you have with your health care provider. Document Released: 01/11/2016 Document Revised: 09/03/2016 Document Reviewed: 10/16/2015 Elsevier Interactive Patient Education  2017 [employer].

## 2017-10-28 LAB — CYTOLOGY - PAP
Diagnosis: NEGATIVE
HPV (WINDOPATH): NOT DETECTED

## 2017-10-28 NOTE — Progress Notes (Signed)
PAP is normal. HPV high  risk is negative can repeat in 5 years

## 2017-11-06 ENCOUNTER — Other Ambulatory Visit: Payer: No Typology Code available for payment source

## 2017-11-25 ENCOUNTER — Ambulatory Visit
Admission: RE | Admit: 2017-11-25 | Discharge: 2017-11-25 | Disposition: A | Discharge status: Home / Self Care | Payer: PRIVATE HEALTH INSURANCE | Source: Ambulatory Visit | Attending: Endocrinology | Admitting: Endocrinology

## 2017-11-25 DIAGNOSIS — E042 Nontoxic multinodular goiter: Secondary | ICD-10-CM

## 2018-03-30 ENCOUNTER — Ambulatory Visit: Payer: No Typology Code available for payment source | Admitting: Endocrinology

## 2018-03-30 DIAGNOSIS — Z0289 Encounter for other administrative examinations: Secondary | ICD-10-CM

## 2018-11-18 ENCOUNTER — Encounter: Payer: PRIVATE HEALTH INSURANCE | Admitting: Internal Medicine

## 2018-11-19 NOTE — Progress Notes (Signed)
Chief Complaint  Patient presents with  . Annual Exam    Knot on right forehead - soft knot, protruding //  ankle and knee pain - referral // Irregular periods, every two weeks // Does not feel well, cold sx's, runny nose    HPI: Patient  Crystal Mcintyre  42 y.o. comes in today for Preventive Health Care visit   Couple of ?s   Using nyquil for a week  And couing and now frequent stools.    A week  No fever sob  No fever   Goiter sees Every 6 mos  Endo  Periods  More frequent 5-7 days ahas fibroids    Health Maintenance  Topic Date Due  . INFLUENZA VACCINE  07/29/2018  . TETANUS/TDAP  12/04/2018  . PAP SMEAR  10/27/2020  . HIV Screening  Completed   Health Maintenance Review LIFESTYLE:  Exercise:  Once or twice a week  Tobacco/ETS:  no Alcohol:  No to rare  Sugar beverages:  n Sleep:  Not that great  Never that good.   6 hours  Drug use: no HH of  2  Dog  Work: up and around.  About 50 hours    Period every 2 weeks   Recently .  About q 20 21 days  ROS:  See hpi GEN/ HEENT: No fever, significant weight changes sweats headaches vision problems hearing changes, CV/ PULM; No chest pain shortness of breath cough, syncope,edema  change in exercise tolerance. GI /GU: No adominal pain, vomiting, change in bowel habits. No blood in the stool. No significant GU symptoms. SKIN/HEME: ,no acute skin rashes suspicious lesions or bleeding. No lymphadenopathy, nodules, masses.  NEURO/ PSYCH:  No neurologic signs such as weakness numbness. No depression anxiety. IMM/ Allergy: No unusual infections.  Allergy .   REST of 12 system review negative except as per HPI   Past Medical History:  Diagnosis Date  . GOITER, MULTINODULAR 04/18/2009  . Headache(784.0) 06/17/2007  . IRREGULAR MENSTRUATION 06/17/2007  . Other anxiety states 01/30/2010  . THYROID STIMULATING HORMONE, ABNORMAL 01/10/2009  . TOBACCO USE 01/10/2008  . TRANSAMINASES, SERUM, ELEVATED 01/10/2009    Past Surgical  History:  Procedure Laterality Date  . TONSILLECTOMY  as child  . TYMPANOPLASTY  4 yrs ago  . TYMPANOSTOMY TUBE PLACEMENT  as child  . UPPER GASTROINTESTINAL ENDOSCOPY      Family History  Problem Relation Age of Onset  . Pneumonia Father   . Alcohol abuse Father   . Colon polyps Mother   . Breast cancer Paternal Aunt   . Diabetes Maternal Grandfather   . Prostate cancer Paternal Grandfather   . Rectal cancer Neg Hx   . Stomach cancer Neg Hx   . Colon cancer Neg Hx     Social History   Socioeconomic History  . Marital status: Married    Spouse name: Not on file  . Number of children: Not on file  . Years of education: Not on file  . Highest education level: Not on file  Occupational History  . Occupation: Intake Warehouse manager at [employer]  . Financial resource strain: Not on file  . Food insecurity:    Worry: Not on file    Inability: Not on file  . Transportation needs:    Medical: Not on file    Non-medical: Not on file  Tobacco Use  . Smoking status: Former Smoker    Last attempt  to quit: 12/29/2008    Years since quitting: 9.9  . Smokeless tobacco: Never Used  Substance and Sexual Activity  . Alcohol use: Yes    Comment: occasional  . Drug use: No  . Sexual activity: Yes    Partners: Male  Lifestyle  . Physical activity:    Days per week: Not on file    Minutes per session: Not on file  . Stress: Not on file  Relationships  . Social connections:    Talks on phone: Not on file    Gets together: Not on file    Attends religious service: Not on file    Active member of club or organization: Not on file    Attends meetings of clubs or organizations: Not on file    Relationship status: Not on file  Other Topics Concern  . Not on file  Social History Narrative   HH of 2   Pet dog   [employer] accounts [employer]   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       Outpatient Medications  Prior to Visit  Medication Sig Dispense Refill  . omeprazole (PRILOSEC) 20 MG capsule TAKE 1 CAPSULE TWICE A DAY 60 capsule 1  . Prenatal Vit-Fe Fumarate-FA (PRENATAL MULTIVITAMIN) TABS Take 1 tablet by mouth daily.    . bromocriptine (PARLODEL) 2.5 MG tablet Take 1 tablet (2.5 mg total) by mouth at bedtime. (Patient not taking: Reported on 11/23/2018) 30 tablet 11   No facility-administered medications prior to visit.      EXAM:  BP 118/60 (BP Location: Right Arm, Patient Position: Sitting, Cuff Size: Normal)   Pulse 85   Temp 98.1 F (36.7 C) (Oral)   Ht _0  (1.626 m)   Wt 179 lb 9.6 oz (81.5 kg)   BMI 30.83 kg/m   Body mass index is 30.83 kg/m. Wt Readings from Last 3 Encounters:  11/23/18 179 lb 9.6 oz (81.5 kg)  10/27/17 178 lb (80.7 kg)  09/29/17 180 lb 3.2 oz (81.7 kg)    Physical Exam: Vital signs reviewed BZJ:IRCV is a well-developed well-nourished alert cooperative    who appearsr stated age in no acute distress.  HEENT: normocephalic atraumatic , Eyes: PERRL EOM's full, conjunctiva clear, Nares: paten,t no deformity discharge or tenderness., Ears: no deformity EAC's clear TMs with normal landmarks. Mouth: clear OP, no lesions, edema.  Moist mucous membranes. Dentition in adequate repair. NECK: supple without or bruits. Goiter noted nt  CHEST/PULM:  Clear to auscultation and percussion breath sounds equal no wheeze , rales or rhonchi. No chest wall deformities or tenderness. Breast: normal by inspection . No dimpling, discharge, masses, tenderness or discharge . CV: PMI is nondisplaced, S1 S2 no gallops, murmurs, rubs. Peripheral pulses are full without delay.No JVD .  ABDOMEN: Bowel sounds normal nontender  No guard or rebound, no hepato splenomegal no CVA tenderness.  No hernia.  Fibroids felt tovelow umbi non tender  Extremtities:  No clubbing cyanosis or edema, no acute joint swelling or redness no focal atrophy NEURO:  Oriented x3, cranial nerves 3-12 appear to  be intact, no obvious focal weakness,gait within normal limits no abnormal reflexes or asymmetrical SKIN: No acute rashes normal turgor, color, no bruising or petechiae.  righ forehead a sfot cystic ? Mobile area about  1 cm  PSYCH: Oriented, good eye contact, no obvious depression anxiety, cognition and judgment appear normal. LN: no cervical axillary inguinal adenopathy  Lab Results  Component  Value Date   WBC 4.3 10/27/2017   HGB 12.1 10/27/2017   HCT 37.4 10/27/2017   PLT 265.0 10/27/2017   GLUCOSE 79 10/27/2017   CHOL 175 10/27/2017   TRIG 54.0 10/27/2017   HDL 57.50 10/27/2017   LDLCALC 107 (H) 10/27/2017   ALT 11 10/27/2017   AST 14 10/27/2017   NA 137 10/27/2017   K 4.2 10/27/2017   CL 104 10/27/2017   CREATININE 0.59 10/27/2017   BUN 10 10/27/2017   CO2 28 10/27/2017   TSH 2.36 09/29/2017    BP Readings from Last 3 Encounters:  11/23/18 118/60  10/27/17 106/78  09/29/17 112/80    Lab plan  reviewed with patient   ASSESSMENT AND PLAN:  Discussed the following assessment and plan:  Visit for preventive health examination - Plan: Basic metabolic panel, CBC with Differential/Platelet, Hepatic function panel, Lipid panel, TSH, T4, free, Prolactin  GOITER, MULTINODULAR - Plan: Basic metabolic panel, CBC with Differential/Platelet, Hepatic function panel, Lipid panel, TSH, T4, free, Prolactin  Hyperthyroidism - Plan: Basic metabolic panel, CBC with Differential/Platelet, Hepatic function panel, Lipid panel, TSH, T4, free, Prolactin  Hyperprolactinemia (HCC) - Plan: Basic metabolic panel, CBC with Differential/Platelet, Hepatic function panel, Lipid panel, TSH, T4, free, Prolactin  Frequent menstruation - Plan: Basic metabolic panel, CBC with Differential/Platelet, Hepatic function panel, Lipid panel, TSH, T4, free, Prolactin  Uterine leiomyoma, unspecified location - Plan: Basic metabolic panel, CBC with Differential/Platelet, Hepatic function panel, Lipid panel,  TSH, T4, free, Prolactin  Lump prob cyst forehead Frequent periods borderline nl  Future labs  If  persistent or progressive may see [facility] otherwise follow Patient Care Team: Burnis Medin, MD as PCP - General Renato Shin, MD (Endocrinology) Michel Santee, MD (Neurology) Servando Salina, MD as Consulting Physician (Obstetrics and Gynecology) Patient Instructions  Get appt for am fasting lab test to include thyroid and  Prolactin.  Track periods  If are continuing less than 21 days day 1 to day 1  Or prolonged then see dr Garwin Brothers . I think the lump on forehead is benign cyst but can get plastic surgeon to check it out  .  Pap not due until 2021 - 2022  Preventive Care 40-64 Years, Female Preventive care refers to lifestyle choices and visits with your health care provider that can promote health and wellness. What does preventive care include?  A yearly physical exam. This is also called an annual well check.  Dental exams once or twice a year.  Routine eye exams. Ask your health care provider how often you should have your eyes checked.  Personal lifestyle choices, including: ? Daily care of your teeth and gums. ? Regular physical activity. ? Eating a healthy diet. ? Avoiding tobacco and drug use. ? Limiting alcohol use. ? Practicing safe sex. ? Taking low-dose aspirin daily starting at age 61. ? Taking vitamin and mineral supplements as recommended by your health care provider. What happens during an annual well check? The services and screenings done by your health care provider during your annual well check will depend on your age, overall health, lifestyle risk factors, and family history of disease. Counseling Your health care provider may ask you questions about your:  Alcohol use.  Tobacco use.  Drug use.  Emotional well-being.  Home and relationship well-being.  Sexual activity.  Eating habits.  Work and work Statistician.  Method of birth  control.  Menstrual cycle.  Pregnancy history.  Screening You may have the following tests or measurements:  Height, weight, and BMI.  Blood pressure.  Lipid and cholesterol levels. These may be checked every 5 years, or more frequently if you are over 87 years old.  Skin check.  Lung cancer screening. You may have this screening every year starting at age 70 if you have a 30-pack-year history of smoking and currently smoke or have quit within the past 15 years.  Fecal occult blood test (FOBT) of the stool. You may have this test every year starting at age 74.  Flexible sigmoidoscopy or colonoscopy. You may have a sigmoidoscopy every 5 years or a colonoscopy every 10 years starting at age 50.  Hepatitis C blood test.  Hepatitis B blood test.  Sexually transmitted disease (STD) testing.  Diabetes screening. This is done by checking your blood sugar (glucose) after you have not eaten for a while (fasting). You may have this done every 1-3 years.  Mammogram. This may be done every 1-2 years. Talk to your health care provider about when you should start having regular mammograms. This may depend on whether you have a family history of breast cancer.  BRCA-related cancer screening. This may be done if you have a family history of breast, ovarian, tubal, or peritoneal cancers.  Pelvic exam and Pap test. This may be done every 3 years starting at age 17. Starting at age 48, this may be done every 5 years if you have a Pap test in combination with an HPV test.  Bone density scan. This is done to screen for osteoporosis. You may have this scan if you are at high risk for osteoporosis.  Discuss your test results, treatment options, and if necessary, the need for more tests with your health care provider. Vaccines Your health care provider may recommend certain vaccines, such as:  Influenza vaccine. This is recommended every year.  Tetanus, diphtheria, and acellular pertussis (Tdap,  Td) vaccine. You may need a Td booster every 10 years.  Varicella vaccine. You may need this if you have not been vaccinated.  Zoster vaccine. You may need this after age 75.  Measles, mumps, and rubella (MMR) vaccine. You may need at least one dose of MMR if you were born in 1957 or later. You may also need a second dose.  Pneumococcal 13-valent conjugate (PCV13) vaccine. You may need this if you have certain conditions and were not previously vaccinated.  Pneumococcal polysaccharide (PPSV23) vaccine. You may need one or two doses if you smoke cigarettes or if you have certain conditions.  Meningococcal vaccine. You may need this if you have certain conditions.  Hepatitis A vaccine. You may need this if you have certain conditions or if you travel or work in places where you may be exposed to hepatitis A.  Hepatitis B vaccine. You may need this if you have certain conditions or if you travel or work in places where you may be exposed to hepatitis B.  Haemophilus influenzae type b (Hib) vaccine. You may need this if you have certain conditions.  Talk to your health care provider about which screenings and vaccines you need and how often you need them. This information is not intended to replace advice given to you by your health care provider. Make sure you discuss any questions you have with your health care provider. Document Released: 01/11/2016 Document Revised: 09/03/2016 Document Reviewed: 10/16/2015 Elsevier Interactive Patient Education  2018 [employer]. Kali Deadwyler M.D.

## 2018-11-23 ENCOUNTER — Ambulatory Visit (INDEPENDENT_AMBULATORY_CARE_PROVIDER_SITE_OTHER): Payer: BLUE CROSS/BLUE SHIELD | Admitting: Internal Medicine

## 2018-11-23 ENCOUNTER — Encounter: Payer: Self-pay | Admitting: Internal Medicine

## 2018-11-23 VITALS — BP 118/60 | HR 85 | Temp 98.1°F | Ht 64.0 in | Wt 179.6 lb

## 2018-11-23 DIAGNOSIS — D259 Leiomyoma of uterus, unspecified: Secondary | ICD-10-CM

## 2018-11-23 DIAGNOSIS — Z0000 Encounter for general adult medical examination without abnormal findings: Secondary | ICD-10-CM | POA: Diagnosis not present

## 2018-11-23 DIAGNOSIS — E042 Nontoxic multinodular goiter: Secondary | ICD-10-CM

## 2018-11-23 DIAGNOSIS — E221 Hyperprolactinemia: Secondary | ICD-10-CM

## 2018-11-23 DIAGNOSIS — N920 Excessive and frequent menstruation with regular cycle: Secondary | ICD-10-CM

## 2018-11-23 DIAGNOSIS — E0590 Thyrotoxicosis, unspecified without thyrotoxic crisis or storm: Secondary | ICD-10-CM | POA: Diagnosis not present

## 2018-11-23 DIAGNOSIS — R229 Localized swelling, mass and lump, unspecified: Secondary | ICD-10-CM

## 2018-11-23 DIAGNOSIS — IMO0002 Reserved for concepts with insufficient information to code with codable children: Secondary | ICD-10-CM

## 2018-11-23 NOTE — Patient Instructions (Addendum)
Get appt for am fasting lab test to include thyroid and  Prolactin.  Track periods  If are continuing less than 21 days day 1 to day 1  Or prolonged then see dr Garwin Brothers . I think the lump on forehead is benign cyst but can get plastic surgeon to check it out  .  Pap not due until 2021 - 2022  Preventive Care 40-64 Years, Female Preventive care refers to lifestyle choices and visits with your health care provider that can promote health and wellness. What does preventive care include?  A yearly physical exam. This is also called an annual well check.  Dental exams once or twice a year.  Routine eye exams. Ask your health care provider how often you should have your eyes checked.  Personal lifestyle choices, including: ? Daily care of your teeth and gums. ? Regular physical activity. ? Eating a healthy diet. ? Avoiding tobacco and drug use. ? Limiting alcohol use. ? Practicing safe sex. ? Taking low-dose aspirin daily starting at age 44. ? Taking vitamin and mineral supplements as recommended by your health care provider. What happens during an annual well check? The services and screenings done by your health care provider during your annual well check will depend on your age, overall health, lifestyle risk factors, and family history of disease. Counseling Your health care provider may ask you questions about your:  Alcohol use.  Tobacco use.  Drug use.  Emotional well-being.  Home and relationship well-being.  Sexual activity.  Eating habits.  Work and work Statistician.  Method of birth control.  Menstrual cycle.  Pregnancy history.  Screening You may have the following tests or measurements:  Height, weight, and BMI.  Blood pressure.  Lipid and cholesterol levels. These may be checked every 5 years, or more frequently if you are over 57 years old.  Skin check.  Lung cancer screening. You may have this screening every year starting at age 6 if you have  a 30-pack-year history of smoking and currently smoke or have quit within the past 15 years.  Fecal occult blood test (FOBT) of the stool. You may have this test every year starting at age 63.  Flexible sigmoidoscopy or colonoscopy. You may have a sigmoidoscopy every 5 years or a colonoscopy every 10 years starting at age 4.  Hepatitis C blood test.  Hepatitis B blood test.  Sexually transmitted disease (STD) testing.  Diabetes screening. This is done by checking your blood sugar (glucose) after you have not eaten for a while (fasting). You may have this done every 1-3 years.  Mammogram. This may be done every 1-2 years. Talk to your health care provider about when you should start having regular mammograms. This may depend on whether you have a family history of breast cancer.  BRCA-related cancer screening. This may be done if you have a family history of breast, ovarian, tubal, or peritoneal cancers.  Pelvic exam and Pap test. This may be done every 3 years starting at age 63. Starting at age 74, this may be done every 5 years if you have a Pap test in combination with an HPV test.  Bone density scan. This is done to screen for osteoporosis. You may have this scan if you are at high risk for osteoporosis.  Discuss your test results, treatment options, and if necessary, the need for more tests with your health care provider. Vaccines Your health care provider may recommend certain vaccines, such as:  Influenza vaccine. This  is recommended every year.  Tetanus, diphtheria, and acellular pertussis (Tdap, Td) vaccine. You may need a Td booster every 10 years.  Varicella vaccine. You may need this if you have not been vaccinated.  Zoster vaccine. You may need this after age 25.  Measles, mumps, and rubella (MMR) vaccine. You may need at least one dose of MMR if you were born in 1957 or later. You may also need a second dose.  Pneumococcal 13-valent conjugate (PCV13) vaccine. You  may need this if you have certain conditions and were not previously vaccinated.  Pneumococcal polysaccharide (PPSV23) vaccine. You may need one or two doses if you smoke cigarettes or if you have certain conditions.  Meningococcal vaccine. You may need this if you have certain conditions.  Hepatitis A vaccine. You may need this if you have certain conditions or if you travel or work in places where you may be exposed to hepatitis A.  Hepatitis B vaccine. You may need this if you have certain conditions or if you travel or work in places where you may be exposed to hepatitis B.  Haemophilus influenzae type b (Hib) vaccine. You may need this if you have certain conditions.  Talk to your health care provider about which screenings and vaccines you need and how often you need them. This information is not intended to replace advice given to you by your health care provider. Make sure you discuss any questions you have with your health care provider. Document Released: 01/11/2016 Document Revised: 09/03/2016 Document Reviewed: 10/16/2015 Elsevier Interactive Patient Education  [employer].

## 2018-12-02 ENCOUNTER — Other Ambulatory Visit (INDEPENDENT_AMBULATORY_CARE_PROVIDER_SITE_OTHER): Payer: BLUE CROSS/BLUE SHIELD

## 2018-12-02 DIAGNOSIS — E042 Nontoxic multinodular goiter: Secondary | ICD-10-CM | POA: Diagnosis not present

## 2018-12-02 DIAGNOSIS — E221 Hyperprolactinemia: Secondary | ICD-10-CM | POA: Diagnosis not present

## 2018-12-02 DIAGNOSIS — E0590 Thyrotoxicosis, unspecified without thyrotoxic crisis or storm: Secondary | ICD-10-CM

## 2018-12-02 DIAGNOSIS — Z0000 Encounter for general adult medical examination without abnormal findings: Secondary | ICD-10-CM | POA: Diagnosis not present

## 2018-12-02 DIAGNOSIS — D259 Leiomyoma of uterus, unspecified: Secondary | ICD-10-CM

## 2018-12-02 DIAGNOSIS — N920 Excessive and frequent menstruation with regular cycle: Secondary | ICD-10-CM

## 2018-12-02 LAB — CBC WITH DIFFERENTIAL/PLATELET
BASOS ABS: 0 10*3/uL (ref 0.0–0.1)
Basophils Relative: 0.4 % (ref 0.0–3.0)
EOS ABS: 0.1 10*3/uL (ref 0.0–0.7)
Eosinophils Relative: 2.7 % (ref 0.0–5.0)
HCT: 33.3 % — ABNORMAL LOW (ref 36.0–46.0)
Hemoglobin: 10.7 g/dL — ABNORMAL LOW (ref 12.0–15.0)
LYMPHS ABS: 1.4 10*3/uL (ref 0.7–4.0)
LYMPHS PCT: 41.3 % (ref 12.0–46.0)
MCHC: 32 g/dL (ref 30.0–36.0)
MCV: 81.8 fl (ref 78.0–100.0)
Monocytes Absolute: 0.3 10*3/uL (ref 0.1–1.0)
Monocytes Relative: 8.4 % (ref 3.0–12.0)
NEUTROS ABS: 1.6 10*3/uL (ref 1.4–7.7)
NEUTROS PCT: 47.2 % (ref 43.0–77.0)
PLATELETS: 360 10*3/uL (ref 150.0–400.0)
RBC: 4.08 Mil/uL (ref 3.87–5.11)
RDW: 15.2 % (ref 11.5–15.5)
WBC: 3.3 10*3/uL — AB (ref 4.0–10.5)

## 2018-12-02 LAB — LIPID PANEL
CHOL/HDL RATIO: 3
Cholesterol: 198 mg/dL (ref 0–200)
HDL: 62.7 mg/dL (ref >39.00)
LDL CALC: 125 mg/dL — AB (ref 0–99)
NonHDL: 135.74
Triglycerides: 53 mg/dL (ref 0.0–149.0)
VLDL: 10.6 mg/dL (ref 0.0–40.0)

## 2018-12-02 LAB — BASIC METABOLIC PANEL
BUN: 10 mg/dL (ref 6–23)
CO2: 29 mEq/L (ref 19–32)
Calcium: 9.6 mg/dL (ref 8.4–10.5)
Chloride: 105 mEq/L (ref 96–112)
Creatinine, Ser: 0.7 mg/dL (ref 0.40–1.20)
GFR: 116.95 mL/min (ref >60.00)
Glucose, Bld: 83 mg/dL (ref 70–99)
POTASSIUM: 4.5 meq/L (ref 3.5–5.1)
Sodium: 139 mEq/L (ref 135–145)

## 2018-12-02 LAB — HEPATIC FUNCTION PANEL
ALBUMIN: 4.6 g/dL (ref 3.5–5.2)
ALT: 10 U/L (ref 0–35)
AST: 14 U/L (ref 0–37)
Alkaline Phosphatase: 57 U/L (ref 39–117)
Bilirubin, Direct: 0.1 mg/dL (ref 0.0–0.3)
TOTAL PROTEIN: 7.2 g/dL (ref 6.0–8.3)
Total Bilirubin: 0.4 mg/dL (ref 0.2–1.2)

## 2018-12-02 LAB — T4, FREE: FREE T4: 0.64 ng/dL (ref 0.60–1.60)

## 2018-12-02 LAB — TSH: TSH: 1.2 u[IU]/mL (ref 0.35–4.50)

## 2018-12-03 ENCOUNTER — Other Ambulatory Visit: Payer: BLUE CROSS/BLUE SHIELD

## 2018-12-03 LAB — PROLACTIN: PROLACTIN: 15.3 ng/mL

## 2018-12-16 ENCOUNTER — Ambulatory Visit: Payer: BLUE CROSS/BLUE SHIELD | Admitting: Endocrinology

## 2018-12-16 DIAGNOSIS — Z0289 Encounter for other administrative examinations: Secondary | ICD-10-CM

## 2019-03-04 ENCOUNTER — Other Ambulatory Visit: Payer: BLUE CROSS/BLUE SHIELD

## 2019-05-13 ENCOUNTER — Ambulatory Visit (INDEPENDENT_AMBULATORY_CARE_PROVIDER_SITE_OTHER): Payer: Self-pay | Admitting: Internal Medicine

## 2019-05-13 ENCOUNTER — Encounter: Payer: Self-pay | Admitting: Internal Medicine

## 2019-05-13 DIAGNOSIS — R350 Frequency of micturition: Secondary | ICD-10-CM

## 2019-05-13 DIAGNOSIS — R399 Unspecified symptoms and signs involving the genitourinary system: Secondary | ICD-10-CM

## 2019-05-13 DIAGNOSIS — R1030 Lower abdominal pain, unspecified: Secondary | ICD-10-CM

## 2019-05-13 LAB — POC URINALSYSI DIPSTICK (AUTOMATED)
Bilirubin, UA: NEGATIVE
Blood, UA: NEGATIVE
Glucose, UA: NEGATIVE
Ketones, UA: NEGATIVE
Leukocytes, UA: NEGATIVE
Nitrite, UA: NEGATIVE
Protein, UA: POSITIVE — AB
Spec Grav, UA: >=1.03 — AB (ref 1.010–1.025)
Urobilinogen, UA: 0.2 E.U./dL
pH, UA: 6 (ref 5.0–8.0)

## 2019-05-13 LAB — POCT URINE PREGNANCY: Preg Test, Ur: NEGATIVE

## 2019-05-13 NOTE — Progress Notes (Signed)
Virtual Visit via Video Note  I connected with@ on 05/13/19 at 10:00 AM EDT by a video enabled telemedicine application and verified that I am speaking with the correct person using two identifiers. Location patient: home Location provider:work  office Persons participating in the virtual visit: patient, provider  [place] national recommendations  regarding COVID 19 pandemic   video visit is advised over in office visit for this patient.  Patient aware  of the limitations of evaluation and management by telemedicine and  availability of in person appointments. and agreed to proceed.   HPI: Crystal Mcintyre presents for video visit   Presents for video visit   About 10 - 14 days ago had vaginal? Itching without dc and rx miconazole for 7 fdays and ithcy better   But had developed some urinary ? Frequency but no dysuria  And then "tummy ache" lower mid line   With ? One day "diarrhea   " more frequent stool baseline 2 then 4-6 in a day . No fever flank pain blood  . 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   lmp April 17-25  Not pregnant  Married   ROS: See pertinent positives and negatives per HPI.staying hjydrated no vomiting nausea  Other concerns   Past Medical History:  Diagnosis Date  . GOITER, MULTINODULAR 04/18/2009  . Headache(784.0) 06/17/2007  . IRREGULAR MENSTRUATION 06/17/2007  . Other anxiety states 01/30/2010  . THYROID STIMULATING HORMONE, ABNORMAL 01/10/2009  . TOBACCO USE 01/10/2008  . TRANSAMINASES, SERUM, ELEVATED 01/10/2009    Past Surgical History:  Procedure Laterality Date  . TONSILLECTOMY  as child  . TYMPANOPLASTY  4 yrs ago  . TYMPANOSTOMY TUBE PLACEMENT  as child  . UPPER GASTROINTESTINAL ENDOSCOPY      Family History  Problem Relation Age of Onset  . Pneumonia Father   . Alcohol abuse Father   . Colon polyps Mother   . Breast cancer Paternal Aunt   . Diabetes Maternal  Grandfather   . Prostate cancer Paternal Grandfather   . Rectal cancer Neg Hx   . Stomach cancer Neg Hx   . Colon cancer Neg Hx     Social History   Tobacco Use  . Smoking status: Former Smoker    Last attempt to quit: 12/29/2008    Years since quitting: 10.3  . Smokeless tobacco: Never Used  Substance Use Topics  . Alcohol use: Yes    Comment: occasional  . Drug use: No      Current Outpatient Medications:  .  omeprazole (PRILOSEC) 20 MG capsule, TAKE 1 CAPSULE TWICE A DAY, Disp: 60 capsule, Rfl: 1 .  Prenatal Vit-Fe Fumarate-FA (PRENATAL MULTIVITAMIN) TABS, Take 1 tablet by mouth daily., Disp: , Rfl:   EXAM: BP Readings from Last 3 Encounters:  11/23/18 118/60  10/27/17 106/78  09/29/17 112/80    VITALS per patient if applicable:  GENERAL: alert, oriented, appears well and in no acute distress  HEENT: atraumatic, conjunttiva clear, no obvious abnormalities on inspection of external nose and ears  NECK: normal movements of the head and neck  LUNGS: on inspection no signs of respiratory distress, breathing rate appears normal, no obvious gross SOB, gasping or wheezing  CV: no obvious cyanosis  MS: moves all visible extremities without noticeable abnormality  PSYCH/NEURO: pleasant and cooperative, no obvious depression or anxiety, speech and thought processing grossly intact Lab Results  Component Value Date   WBC 3.3 (L) 12/02/2018   HGB 10.7 (L) 12/02/2018   HCT 33.3 (L) 12/02/2018   PLT 360.0 12/02/2018   GLUCOSE 83 12/02/2018   CHOL 198 12/02/2018   TRIG 53.0 12/02/2018   HDL 62.70 12/02/2018   LDLCALC 125 (H) 12/02/2018   ALT 10 12/02/2018   AST 14 12/02/2018   NA 139 12/02/2018   K 4.5 12/02/2018   CL 105 12/02/2018   CREATININE 0.70 12/02/2018   BUN 10 12/02/2018   CO2 29 12/02/2018   TSH 1.20 12/02/2018    ASSESSMENT AND PLAN:  Discussed the following assessment and plan:  Lower urinary tract symptoms (LUTS) - Plan: POCT Urinalysis  Dipstick (Automated), Urine Culture, POCT urine pregnancy, POCT urine pregnancy, Urine Culture, POCT Urinalysis Dipstick (Automated)  Urine frequency - Plan: POCT Urinalysis Dipstick (Automated), Urine Culture, POCT urine pregnancy, POCT urine pregnancy, Urine Culture, POCT Urinalysis Dipstick (Automated)  Lower abdominal pain - now better   - Plan: POCT Urinalysis Dipstick (Automated), Urine Culture, POCT urine pregnancy, POCT urine pregnancy, Urine Culture, POCT Urinalysis Dipstick (Automated) A lot  better today could have been gi infection  But check  ua cx prog ( not pregnant)  Counseled.   Expectant management and discussion of plan and treatment with opportunity to ask questions and all were answered. The patient agreed with the plan and demonstrated an understanding of the instructions.   Advised to call back or seek an in-person evaluation if worsening  or having  further concerns .   Shanon Ace, MD

## 2019-05-15 LAB — URINE CULTURE
MICRO NUMBER:: 480427
SPECIMEN QUALITY:: ADEQUATE

## 2019-05-19 ENCOUNTER — Other Ambulatory Visit: Payer: Self-pay

## 2019-05-19 DIAGNOSIS — R8290 Unspecified abnormal findings in urine: Secondary | ICD-10-CM

## 2019-10-21 ENCOUNTER — Ambulatory Visit: Payer: Commercial Managed Care - PPO | Admitting: Endocrinology

## 2019-10-21 ENCOUNTER — Encounter: Payer: Self-pay | Admitting: Endocrinology

## 2019-10-21 ENCOUNTER — Other Ambulatory Visit: Payer: Self-pay

## 2019-10-21 VITALS — BP 124/80 | HR 87 | Ht 64.0 in | Wt 186.2 lb

## 2019-10-21 DIAGNOSIS — E0590 Thyrotoxicosis, unspecified without thyrotoxic crisis or storm: Secondary | ICD-10-CM | POA: Diagnosis not present

## 2019-10-21 DIAGNOSIS — E042 Nontoxic multinodular goiter: Secondary | ICD-10-CM

## 2019-10-21 DIAGNOSIS — E221 Hyperprolactinemia: Secondary | ICD-10-CM | POA: Diagnosis not present

## 2019-10-21 DIAGNOSIS — Z23 Encounter for immunization: Secondary | ICD-10-CM

## 2019-10-21 LAB — TSH: TSH: 2 u[IU]/mL (ref 0.35–4.50)

## 2019-10-21 LAB — PROLACTIN: Prolactin: 8.7 ng/mL

## 2019-10-21 LAB — T4, FREE: Free T4: 0.56 ng/dL — ABNORMAL LOW (ref 0.60–1.60)

## 2019-10-21 NOTE — Progress Notes (Signed)
Subjective:    Patient ID: Crystal Mcintyre, female    DOB: 1976-10-01, 43 y.o.   MRN: [id number]  HPI Pt returns for f/u of small multinodular goiter (dx'ed 2010; nodules were not big enough to need bx; f/u US in 2014-2017 we not significantly changed; she took tapazole x 6 months for a suppressed TSH, but stopped due to normalization).  Hyperprolactinemia: (dx'ed 2016; she took parlodel for a brief time in 2016; it was resumed in 2017 for a high-normal prolactin; MRI was normal in 2017).  She has not recently taken the parlodel.  Pt reports moderate swelling at the right ant neck, but no assoc pain.  She says the swelling is worse now Past Medical History:  Diagnosis Date  . GOITER, MULTINODULAR 04/18/2009  . Headache(784.0) 06/17/2007  . IRREGULAR MENSTRUATION 06/17/2007  . Other anxiety states 01/30/2010  . THYROID STIMULATING HORMONE, ABNORMAL 01/10/2009  . TOBACCO USE 01/10/2008  . TRANSAMINASES, SERUM, ELEVATED 01/10/2009    Past Surgical History:  Procedure Laterality Date  . TONSILLECTOMY  as child  . TYMPANOPLASTY  4 yrs ago  . TYMPANOSTOMY TUBE PLACEMENT  as child  . UPPER GASTROINTESTINAL ENDOSCOPY      Social History   Socioeconomic History  . Marital status: Married    Spouse name: Not on file  . Number of children: Not on file  . Years of education: Not on file  . Highest education level: Not on file  Occupational History  . 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 Financial resource strain: Not on file  . Food insecurity    Worry: Not on file    Inability: Not on file  . Transportation needs    Medical: Not on file    Non-medical: Not on file  Tobacco Use  . Smoking status: Former Smoker    Quit date: 12/29/2008    Years since quitting: 10.8  . Smokeless tobacco: Never Used  Substance and Sexual Activity  . Alcohol use: Yes    Comment: occasional  . Drug use: No  . Sexual activity: Yes    Partners:  Male  Lifestyle  . Physical activity    Days per week: Not on file    Minutes per session: Not on file  . Stress: Not on file  Relationships  . Social Herbalist on phone: Not on file    Gets together: Not on file    Attends religious service: Not on file    Active member of club or organization: Not on file    Attends meetings of clubs or organizations: Not on file    Relationship status: Not on file  . Intimate partner violence    Fear of current or ex partner: Not on file    Emotionally abused: Not on file    Physically abused: Not on file    Forced sexual activity: Not on file  Other Topics Concern  . Not on file  Social History Narrative   HH of 2   Pet dog   Bu accounts credits and [employer]   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       Current Outpatient Medications on File Prior to Visit  Medication Sig Dispense Refill  . omeprazole (PRILOSEC) 20 MG capsule TAKE 1 CAPSULE TWICE A DAY 60 capsule 1  . Prenatal Vit-Fe Fumarate-FA (PRENATAL MULTIVITAMIN) TABS Take 1 tablet by mouth daily.  No current facility-administered medications on file prior to visit.     No Known Allergies  Family History  Problem Relation Age of Onset  . Pneumonia Father   . Alcohol abuse Father   . Colon polyps Mother   . Breast cancer Paternal Aunt   . Diabetes Maternal Grandfather   . Prostate cancer Paternal Grandfather   . Rectal cancer Neg Hx   . Stomach cancer Neg Hx   . Colon cancer Neg Hx     BP 124/80 (BP Location: Left Arm, Patient Position: Sitting, Cuff Size: Normal)   Pulse 87   Ht 5\' 4"  (1.626 m)   Wt 186 lb 3.2 oz (84.5 kg)   SpO2 95%   BMI 31.96 kg/m    Review of Systems Denies galactorrhea and breast pain.      Objective:   Physical Exam VITAL SIGNS:  See vs page GENERAL: no distress NECK: thyroid is 3-5 times normal size (R>L).  No palpable nodule.    Prolactin=normal    Assessment & Plan:  MNG: due for recheck  Hyperprolactinemia: stable off rx.    Patient Instructions  Let's recheck the ultrasound.  you will receive a phone call, about a day and time for an appointment. most of the time, a "lumpy thyroid" will eventually become overactive again.  this is usually a slow process, happening over the span of many years. blood tests are requested for you today.  We'll let you know about the results.  Please come back for a follow-up appointment in 6 months.

## 2019-10-21 NOTE — Patient Instructions (Signed)
Let's recheck the ultrasound.  you will receive a phone call, about a day and time for an appointment. most of the time, a "lumpy thyroid" will eventually become overactive again.  this is usually a slow process, happening over the span of many years. blood tests are requested for you today.  We'll let you know about the results.  Please come back for a follow-up appointment in 6 months.

## 2019-10-31 ENCOUNTER — Ambulatory Visit
Admission: RE | Admit: 2019-10-31 | Discharge: 2019-10-31 | Disposition: A | Discharge status: Home / Self Care | Payer: Commercial Managed Care - PPO | Source: Ambulatory Visit | Attending: Endocrinology | Admitting: Endocrinology

## 2019-10-31 DIAGNOSIS — E042 Nontoxic multinodular goiter: Secondary | ICD-10-CM

## 2020-01-18 ENCOUNTER — Other Ambulatory Visit: Payer: Self-pay

## 2020-01-18 ENCOUNTER — Ambulatory Visit: Payer: Commercial Managed Care - PPO | Admitting: Plastic Surgery

## 2020-01-18 ENCOUNTER — Encounter: Payer: Self-pay | Admitting: Plastic Surgery

## 2020-01-18 VITALS — BP 122/77 | HR 108 | Temp 96.8°F | Ht 64.5 in | Wt 189.4 lb

## 2020-01-18 DIAGNOSIS — D170 Benign lipomatous neoplasm of skin and subcutaneous tissue of head, face and neck: Secondary | ICD-10-CM

## 2020-01-18 NOTE — Progress Notes (Signed)
Referring Provider Panosh, [name], MD [place],  [place] [address]   CC:  Chief Complaint  Patient presents with  . Advice Only    for hyperpigmentation; neoplasm of uncertain skin      Crystal Mcintyre is an 44 y.o. female.  HPI: Patient presents to discuss a cyst on her right forehead.  She says she hit her forehead in high school years ago but over the last 1 to 2 years has noticed a growing mass underneath the skin on the right forehead.  Her dermatologist injected it with steroids which helped a little bit and cause some hypopigmentation of the skin but she still feels that there is still bothered by it.  She is interested in having it excised.  No Known Allergies  Outpatient Encounter Medications as of 01/18/2020  Medication Sig  . hydroquinone 4 % cream Apply 1 a small amount to affected area twice a day  Apply cream to darker areas twice a day for two months, then one month off  . Multiple Vitamin (MULTI VITAMIN DAILY PO) Take by mouth. Take 2 gummies by mouth daily.  Marland Kitchen omeprazole (PRILOSEC) 20 MG capsule TAKE 1 CAPSULE TWICE A DAY  . [DISCONTINUED] Prenatal Vit-Fe Fumarate-FA (PRENATAL MULTIVITAMIN) TABS Take 1 tablet by mouth daily.   No facility-administered encounter medications on file as of 01/18/2020.     Past Medical History:  Diagnosis Date  . GOITER, MULTINODULAR 04/18/2009  . Headache(784.0) 06/17/2007  . IRREGULAR MENSTRUATION 06/17/2007  . Other anxiety states 01/30/2010  . THYROID STIMULATING HORMONE, ABNORMAL 01/10/2009  . TOBACCO USE 01/10/2008  . TRANSAMINASES, SERUM, ELEVATED 01/10/2009    Past Surgical History:  Procedure Laterality Date  . TONSILLECTOMY  as child  . TYMPANOPLASTY  4 yrs ago  . TYMPANOSTOMY TUBE PLACEMENT  as child  . UPPER GASTROINTESTINAL ENDOSCOPY      Family History  Problem Relation Age of Onset  . Pneumonia Father   . Alcohol abuse Father   . Colon polyps Mother   . Breast cancer Paternal Aunt   .  Diabetes Maternal Grandfather   . Prostate cancer Paternal Grandfather   . Rectal cancer Neg Hx   . Stomach cancer Neg Hx   . Colon cancer Neg Hx     Social History   Social History Narrative   HH of 2   Pet dog   Bu accounts credits and [employer]   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        Review of Systems General: Denies fevers, chills, weight loss CV: Denies chest pain, shortness of breath, palpitations  Physical Exam Vitals with BMI 01/18/2020 10/21/2019 11/23/2018  Height 5' 4.5" 5\' 4"  5\' 4"   Weight 189 lbs 6 oz 186 lbs 3 oz 179 lbs 10 oz  BMI 32.02 [id number]  Systolic 123XX123 [id number]  Diastolic 77 80 60  Pulse 123XX123 87 85    General:  No acute distress,  Alert and oriented, Non-Toxic, Normal speech and affect HEENT: Normocephalic atraumatic.  Extraocular is intact.  Cranial nerves grossly intact.  She has a 2 cm mass on the right forehead that appears to be deep.  It is freely mobile.  There is some hyperpigmentation of the skin just below it.  Assessment/Plan I suspect she has a cyst or lipoma in that area.  Local excision is reasonable.  I discussed the risk that include bleeding, infection, damage surrounding structures including nerves, need  for additional procedures.  She is understanding we will plan to schedule this for her.  [name] 01/18/2020, 2:23 PM

## 2020-02-02 ENCOUNTER — Encounter: Payer: Self-pay | Admitting: Plastic Surgery

## 2020-02-02 ENCOUNTER — Ambulatory Visit: Payer: Commercial Managed Care - PPO | Admitting: Plastic Surgery

## 2020-02-02 ENCOUNTER — Other Ambulatory Visit: Payer: Self-pay

## 2020-02-02 ENCOUNTER — Other Ambulatory Visit (HOSPITAL_COMMUNITY)
Admission: RE | Admit: 2020-02-02 | Discharge: 2020-02-02 | Disposition: A | Discharge status: Home / Self Care | Payer: Commercial Managed Care - PPO | Source: Ambulatory Visit | Attending: Plastic Surgery | Admitting: Plastic Surgery

## 2020-02-02 VITALS — BP 136/66 | HR 79 | Temp 97.3°F | Ht <= 58 in | Wt 189.4 lb

## 2020-02-02 DIAGNOSIS — D170 Benign lipomatous neoplasm of skin and subcutaneous tissue of head, face and neck: Secondary | ICD-10-CM

## 2020-02-02 NOTE — Progress Notes (Signed)
Operative Note   DATE OF OPERATION: 02/02/2020  LOCATION:    SURGICAL DEPARTMENT: Plastic Surgery  PREOPERATIVE DIAGNOSES: Right forehead cyst  POSTOPERATIVE DIAGNOSES:  same  PROCEDURE:  1. Excision of right forehead soft tissue mass measuring 2.5 cm 2. Complex closure measuring 2.5 cm  SURGEON: Talmadge Coventry, MD  ANESTHESIA:  Local  COMPLICATIONS: None.   INDICATIONS FOR PROCEDURE:  The patient, Crystal Mcintyre is a 44 y.o. female born on [birth year], is here for treatment of right forehead cyst MRN: [id number]  CONSENT:  Informed consent was obtained directly from the patient. Risks, benefits and alternatives were fully discussed. Specific risks including but not limited to bleeding, infection, hematoma, seroma, scarring, pain, infection, wound healing problems, and need for further surgery were all discussed. The patient did have an ample opportunity to have questions answered to satisfaction.   DESCRIPTION OF PROCEDURE:  Local anesthesia was administered. The patient's operative site was prepped and draped in a sterile fashion. A time out was performed and all information was confirmed to be correct.  The lesion was excised with a 15 blade and scissor dissection.  The mass was deep to the frontalis muscle.  Hemostasis was obtained.  Circumferential undermining was performed and the skin was advanced and closed in layers with interrupted buried Monocryl sutures and 5-0 fast gut for the skin.  The lesion excised measured 2.5 cm, and the total length of closure measured 2.5 cm.    The patient tolerated the procedure well.  There were no complications.

## 2020-02-03 LAB — SURGICAL PATHOLOGY

## 2020-02-07 ENCOUNTER — Other Ambulatory Visit: Payer: Self-pay | Admitting: Obstetrics and Gynecology

## 2020-02-07 DIAGNOSIS — R928 Other abnormal and inconclusive findings on diagnostic imaging of breast: Secondary | ICD-10-CM

## 2020-02-09 ENCOUNTER — Institutional Professional Consult (permissible substitution): Payer: Commercial Managed Care - PPO | Admitting: Plastic Surgery

## 2020-02-13 ENCOUNTER — Other Ambulatory Visit: Payer: Self-pay

## 2020-02-13 NOTE — Progress Notes (Deleted)
No chief complaint on file.   HPI: Crystal Mcintyre 44 y.o. come in for  ROS: See pertinent positives and negatives per HPI.  Past Medical History:  Diagnosis Date  . GOITER, MULTINODULAR 04/18/2009  . Headache(784.0) 06/17/2007  . IRREGULAR MENSTRUATION 06/17/2007  . Other anxiety states 01/30/2010  . THYROID STIMULATING HORMONE, ABNORMAL 01/10/2009  . TOBACCO USE 01/10/2008  . TRANSAMINASES, SERUM, ELEVATED 01/10/2009    Family History  Problem Relation Age of Onset  . Pneumonia Father   . Alcohol abuse Father   . Colon polyps Mother   . Breast cancer Paternal Aunt   . Diabetes Maternal Grandfather   . Prostate cancer Paternal Grandfather   . Rectal cancer Neg Hx   . Stomach cancer Neg Hx   . Colon cancer Neg Hx     Social History   Socioeconomic History  . Marital status: Married    Spouse name: Not on file  . Number of children: Not on file  . Years of education: Not on file  . Highest education level: Not on file  Occupational History  . 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 Smoking status: Former Smoker    Quit date: 12/29/2008    Years since quitting: 11.1  . Smokeless tobacco: Never Used  Substance and Sexual Activity  . Alcohol use: Yes    Comment: occasional  . Drug use: No  . Sexual activity: Yes    Partners: Male  Other Topics Concern  . Not on file  Social History Narrative   HH of 2   Pet dog   Bu accounts credits and [employer]   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      Social Determinants of Health   Financial Resource Strain:   . Difficulty of Paying Living Expenses: Not on file  Food Insecurity:   . Worried About Charity fundraiser in the Last Year: Not on file  . Ran Out of Food in the Last Year: Not on file  Transportation Needs:   . Lack of Transportation (Medical): Not on file  . Lack of Transportation (Non-Medical): Not on file  Physical Activity:     . Days of Exercise per Week: Not on file  . Minutes of Exercise per Session: Not on file  Stress:   . Feeling of Stress : Not on file  Social Connections:   . Frequency of Communication with Friends and Family: Not on file  . Frequency of Social Gatherings with Friends and Family: Not on file  . Attends Religious Services: Not on file  . Active Member of Clubs or Organizations: Not on file  . Attends Archivist Meetings: Not on file  . Marital Status: Not on file    Outpatient Medications Prior to Visit  Medication Sig Dispense Refill  . hydroquinone 4 % cream Apply 1 a small amount to affected area twice a day  Apply cream to darker areas twice a day for two months, then one month off    . Multiple Vitamin (MULTI VITAMIN DAILY PO) Take by mouth. Take 2 gummies by mouth daily.    Marland Kitchen omeprazole (PRILOSEC) 20 MG capsule TAKE 1 CAPSULE TWICE A DAY 60 capsule 1   No facility-administered medications prior to visit.     EXAM:  There were no vitals taken for this visit.  There is no height or weight on file to  calculate BMI.  GENERAL: vitals reviewed and listed above, alert, oriented, appears well hydrated and in no acute distress HEENT: atraumatic, conjunctiva  clear, no obvious abnormalities on inspection of external nose and ears OP : no lesion edema or exudate  NECK: no obvious masses on inspection palpation  LUNGS: clear to auscultation bilaterally, no wheezes, rales or rhonchi, good air movement CV: HRRR, no clubbing cyanosis or  peripheral edema nl cap refill  MS: moves all extremities without noticeable focal  abnormality PSYCH: pleasant and cooperative, no obvious depression or anxiety Lab Results  Component Value Date   WBC 3.3 (L) 12/02/2018   HGB 10.7 (L) 12/02/2018   HCT 33.3 (L) 12/02/2018   PLT 360.0 12/02/2018   GLUCOSE 83 12/02/2018   CHOL 198 12/02/2018   TRIG 53.0 12/02/2018   HDL 62.70 12/02/2018   LDLCALC 125 (H) 12/02/2018   ALT 10 12/02/2018    AST 14 12/02/2018   NA 139 12/02/2018   K 4.5 12/02/2018   CL 105 12/02/2018   CREATININE 0.70 12/02/2018   BUN 10 12/02/2018   CO2 29 12/02/2018   TSH 2.00 10/21/2019   BP Readings from Last 3 Encounters:  02/02/20 136/66  01/18/20 122/77  10/21/19 124/80    ASSESSMENT AND PLAN:  Discussed the following assessment and plan:  No diagnosis found.  -Patient advised to return or notify health care team  if  new concerns arise.  There are no Patient Instructions on file for this visit.   [name]. Andrian Sabala M.D.

## 2020-02-14 ENCOUNTER — Ambulatory Visit: Payer: Commercial Managed Care - PPO | Admitting: Internal Medicine

## 2020-02-16 ENCOUNTER — Institutional Professional Consult (permissible substitution): Payer: Commercial Managed Care - PPO | Admitting: Plastic Surgery

## 2020-02-16 ENCOUNTER — Ambulatory Visit (INDEPENDENT_AMBULATORY_CARE_PROVIDER_SITE_OTHER): Payer: Commercial Managed Care - PPO | Admitting: Surgical

## 2020-02-16 ENCOUNTER — Encounter: Payer: Self-pay | Admitting: Surgical

## 2020-02-16 ENCOUNTER — Other Ambulatory Visit: Payer: Self-pay

## 2020-02-16 VITALS — BP 116/73 | HR 92 | Temp 97.3°F | Ht 64.5 in | Wt 187.0 lb

## 2020-02-16 DIAGNOSIS — D170 Benign lipomatous neoplasm of skin and subcutaneous tissue of head, face and neck: Secondary | ICD-10-CM

## 2020-02-16 NOTE — Progress Notes (Signed)
The patient is a 44 year old female here for follow-up after excision of right forehead soft tissue mass on 02/02/2020 with Dr. Silverio Lay pace. She reports she is doing well. Incision is well-healed. Pathology showed mature fibroadipose tissue, lipoma.    Fast gut has dissolved.  She can begin using Mederma cream.  Recommend covering with sunscreen or hat when in the sun for a few months to avoid discoloration.  Call with any questions or concerns.  She is also interested in breast reduction.  She reports that she had a mammogram recently that showed a change in her left breast, but reports she has very dense breasts and is going for an ultrasound on 02/20/2020 and a diagnostic mammogram on the left breast for further work-up.  Follow-up as needed for right forehead mass.  She is going to schedule consult for bilateral breast reduction in the near future with Dr. Claudia Desanctis.

## 2020-02-20 ENCOUNTER — Ambulatory Visit
Admission: RE | Admit: 2020-02-20 | Discharge: 2020-02-20 | Disposition: A | Discharge status: Home / Self Care | Payer: Commercial Managed Care - PPO | Source: Ambulatory Visit | Attending: Obstetrics and Gynecology | Admitting: Obstetrics and Gynecology

## 2020-02-20 ENCOUNTER — Other Ambulatory Visit: Payer: Self-pay | Admitting: Obstetrics and Gynecology

## 2020-02-20 ENCOUNTER — Other Ambulatory Visit: Payer: Self-pay

## 2020-02-20 DIAGNOSIS — R928 Other abnormal and inconclusive findings on diagnostic imaging of breast: Secondary | ICD-10-CM

## 2020-02-20 DIAGNOSIS — N6320 Unspecified lump in the left breast, unspecified quadrant: Secondary | ICD-10-CM

## 2020-02-21 NOTE — Progress Notes (Signed)
This visit occurred during the SARS-CoV-2 public health emergency.  Safety protocols were in place, including screening questions prior to the visit, additional usage of staff PPE, and extensive cleaning of exam room while observing appropriate contact time as indicated for disinfecting solutions.    Chief Complaint  Patient presents with  . Back Pain    pt wants to dicuss back pain from scolerosis   . breast reduction    wants to discuss where she is at with breast reduction     HPI: Crystal Mcintyre 44 y.o. come in for problem with back pain   And getting wrose   Over past 2-3 years  Has large breast uncomfortable  Hard to fit bra  And   Has hx of back pain and scoilisus and getting wrose over past years.   Breast heavy tries to do  relieving stretches   Had knot  On head and referral  To surgery  Dr Crystal Mcintyre.     Removed cyst  On Feb 4 and at FU   Dr Crystal Mcintyre  Did mammo and had to do [place]  On Monday  Compl;ex but porb benign cyst  Has 6 mos fu    lay on left side and pain on right side right side  periscalpular  Ongoing  Past hx of  Crystal Mcintyre  And had     Adjustments  Right rib hump.   Works  [facility]  [place] [facility] has had Crystal Mcintyre cov2 vaccine   ROS: See pertinent positives and negatives per HPI.  Past Medical History:  Diagnosis Date  . GOITER, MULTINODULAR 04/18/2009  . Headache(784.0) 06/17/2007  . IRREGULAR MENSTRUATION 06/17/2007  . Other anxiety states 01/30/2010  . THYROID STIMULATING HORMONE, ABNORMAL 01/10/2009  . TOBACCO USE 01/10/2008  . TRANSAMINASES, SERUM, ELEVATED 01/10/2009    Family History  Problem Relation Age of Onset  . Pneumonia Father   . Alcohol abuse Father   . Colon polyps Mother   . Breast cancer Paternal Aunt   . Diabetes Maternal Grandfather   . Prostate cancer Paternal Grandfather   . Breast cancer Paternal Aunt   . Rectal cancer Neg Hx   . Stomach cancer Neg Hx   . Colon cancer Neg Hx     Social History   Socioeconomic History  .  Marital status: Married    Spouse name: Not on file  . Number of children: Not on file  . Years of education: Not on file  . Highest education level: Not on file  Occupational History  . 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 Smoking status: Former Smoker    Quit date: 12/29/2008    Years since quitting: 11.1  . Smokeless tobacco: Never Used  Substance and Sexual Activity  . Alcohol use: Yes    Comment: occasional  . Drug use: No  . Sexual activity: Yes    Partners: Male  Other Topics Concern  . Not on file  Social History Narrative   HH of 2   Pet dog   Bu accounts credits and [employer]   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      Social Determinants of Health   Financial Resource Strain:   . Difficulty of Paying Living Expenses: Not on file  Food Insecurity:   . Worried About Charity fundraiser in the Last Year: Not on file  . Ran Out of Food  in the Last Year: Not on file  Transportation Needs:   . Lack of Transportation (Medical): Not on file  . Lack of Transportation (Non-Medical): Not on file  Physical Activity:   . Days of Exercise per Week: Not on file  . Minutes of Exercise per Session: Not on file  Stress:   . Feeling of Stress : Not on file  Social Connections:   . Frequency of Communication with Friends and Family: Not on file  . Frequency of Social Gatherings with Friends and Family: Not on file  . Attends Religious Services: Not on file  . Active Member of Clubs or Organizations: Not on file  . Attends Archivist Meetings: Not on file  . Marital Status: Not on file    Outpatient Medications Prior to Visit  Medication Sig Dispense Refill  . hydroquinone 4 % cream Apply 1 a small amount to affected area twice a day  Apply cream to darker areas twice a day for two months, then one month off    . Multiple Vitamin (MULTI VITAMIN DAILY PO) Take by mouth. Take 2 gummies by  mouth daily.    Marland Kitchen omeprazole (PRILOSEC) 20 MG capsule TAKE 1 CAPSULE TWICE A DAY 60 capsule 1   No facility-administered medications prior to visit.     EXAM:  BP 118/66 (BP Location: Right Arm, Patient Position: Sitting, Cuff Size: Normal)   Pulse 96   Temp 97.8 F (36.6 C) (Temporal)   Ht 5\' 5"  (1.651 m)   Wt 187 lb 12.8 oz (85.2 kg)   SpO2 99%   BMI 31.25 kg/m   Body mass index is 31.25 kg/m.  GENERAL: vitals reviewed and listed above, alert, oriented, appears well hydrated and in no acute distress HEENT: atraumatic, conjunctiva  clear, no obvious abnormalities on inspection of external nose and ears OP :masked   NECK: no obvious masses on inspection palpation  Gynecomastia and  Bra strap indentations   Right rib himp scolisosis  And  Area of  Pain in infra scapular right  And ls area   Gait is normal   MS: moves all extremities without noticeable focal  abnormality PSYCH: pleasant and cooperative, no obvious depression or anxiety  BP Readings from Last 3 Encounters:  02/22/20 118/66  02/16/20 116/73  02/02/20 136/66    ASSESSMENT AND PLAN:  Discussed the following assessment and plan:  Chronic right-sided thoracic back pain - Plan: Ambulatory referral to Physical Therapy  Scoliosis (and kyphoscoliosis), idiopathic - Plan: Ambulatory referral to Physical Therapy  Gynecomastia, female - Plan: Ambulatory referral to Physical Therapy  Cyst of breast, unspecified laterality On going back pain over the years   With right scoliosis  managing but has right periscapular pain and heavy back pan  And hx of  Other back pain documented in record related  Can try physical therapy again but  I would support  Breast reduction as an intervention to relieving her chronic sx.  Plan fu virtual visit after therapy   And poss referral to  PS   .   She will do the 6 mos fu US  Breast in the interim  -Patient advised to return or notify health care team  if  new concerns  arise.  Patient Instructions  Will do  Referral to physical therapy  And then  FU virtual visit in follow up.   I agree that   Breast reduction would be helpful.     [name]. Donnette Macmullen M.D.

## 2020-02-22 ENCOUNTER — Encounter: Payer: Self-pay | Admitting: Internal Medicine

## 2020-02-22 ENCOUNTER — Other Ambulatory Visit: Payer: Self-pay

## 2020-02-22 ENCOUNTER — Ambulatory Visit (INDEPENDENT_AMBULATORY_CARE_PROVIDER_SITE_OTHER): Payer: Commercial Managed Care - PPO | Admitting: Internal Medicine

## 2020-02-22 VITALS — BP 118/66 | HR 96 | Temp 97.8°F | Ht 65.0 in | Wt 187.8 lb

## 2020-02-22 DIAGNOSIS — N6009 Solitary cyst of unspecified breast: Secondary | ICD-10-CM

## 2020-02-22 DIAGNOSIS — N62 Hypertrophy of breast: Secondary | ICD-10-CM | POA: Diagnosis not present

## 2020-02-22 DIAGNOSIS — M4120 Other idiopathic scoliosis, site unspecified: Secondary | ICD-10-CM

## 2020-02-22 DIAGNOSIS — M546 Pain in thoracic spine: Secondary | ICD-10-CM | POA: Diagnosis not present

## 2020-02-22 DIAGNOSIS — G8929 Other chronic pain: Secondary | ICD-10-CM

## 2020-02-22 NOTE — Patient Instructions (Signed)
Will do  Referral to physical therapy  And then  FU virtual visit in follow up.   I agree that   Breast reduction would be helpful.

## 2020-02-23 ENCOUNTER — Institutional Professional Consult (permissible substitution): Payer: Commercial Managed Care - PPO | Admitting: Plastic Surgery

## 2020-04-20 ENCOUNTER — Ambulatory Visit: Payer: Commercial Managed Care - PPO | Admitting: Endocrinology

## 2020-04-20 DIAGNOSIS — Z0289 Encounter for other administrative examinations: Secondary | ICD-10-CM

## 2020-06-21 ENCOUNTER — Other Ambulatory Visit: Payer: Self-pay

## 2020-06-21 ENCOUNTER — Encounter: Payer: Self-pay | Admitting: Endocrinology

## 2020-06-21 ENCOUNTER — Ambulatory Visit: Payer: Commercial Managed Care - PPO | Admitting: Endocrinology

## 2020-06-21 VITALS — BP 110/78 | HR 83 | Ht 65.0 in | Wt 170.4 lb

## 2020-06-21 DIAGNOSIS — E221 Hyperprolactinemia: Secondary | ICD-10-CM | POA: Diagnosis not present

## 2020-06-21 DIAGNOSIS — E0590 Thyrotoxicosis, unspecified without thyrotoxic crisis or storm: Secondary | ICD-10-CM

## 2020-06-21 LAB — PROLACTIN: Prolactin: 10 ng/mL

## 2020-06-21 LAB — T4, FREE: Free T4: 0.78 ng/dL (ref 0.60–1.60)

## 2020-06-21 LAB — TSH: TSH: 0.91 u[IU]/mL (ref 0.35–4.50)

## 2020-06-21 NOTE — Progress Notes (Signed)
Subjective:    Patient ID: Crystal Mcintyre, female    DOB: 02-19-1976, 44 y.o.   MRN: [id number]  HPI Pt returns for f/u of small multinodular goiter (dx'ed 2010; nodules were not big enough to need bx; f/u US in 2014-2020 were not significantly changed; she took tapazole x 6 months for a suppressed TSH, but stopped due to normalization).   Hyperprolactinemia: (dx'ed 2016; she took parlodel for a brief time in 2016; it was resumed in 2017 for a high-normal prolactin; MRI was normal in 2017; she stopped parlodel since 2020; she is not considering a pregnancy).   Past Medical History:  Diagnosis Date   GOITER, MULTINODULAR 04/18/2009   Headache(784.0) 06/17/2007   IRREGULAR MENSTRUATION 06/17/2007   Other anxiety states 01/30/2010   THYROID STIMULATING HORMONE, ABNORMAL 01/10/2009   TOBACCO USE 01/10/2008   TRANSAMINASES, SERUM, ELEVATED 01/10/2009    Past Surgical History:  Procedure Laterality Date   TONSILLECTOMY  as child   TYMPANOPLASTY  4 yrs ago   TYMPANOSTOMY TUBE PLACEMENT  as child   UPPER GASTROINTESTINAL ENDOSCOPY      Social History   Socioeconomic History   Marital status: Married    Spouse name: Not on file   Number of children: Not on file   Years of education: Not on file   Highest education level: Not on file  Occupational History   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      Social Determinants of Health   Financial Resource Strain:    Difficulty of Paying  Living Expenses:   Food Insecurity:    Worried About Charity fundraiser in the Last Year:    Arboriculturist in the Last Year:   Transportation Needs:    Film/video editor (Medical):    Lack of Transportation (Non-Medical):   Physical Activity:    Days of Exercise per Week:    Minutes of Exercise per Session:   Stress:    Feeling of Stress :   Social Connections:    Frequency of Communication with Friends and Family:    Frequency of Social Gatherings with Friends and Family:    Attends Religious Services:    Active Member of Clubs or Organizations:    Attends Music therapist:    Marital Status:   Intimate Partner Violence:    Fear of Current or Ex-Partner:    Emotionally Abused:    Physically Abused:    Sexually Abused:     Current Outpatient Medications on File Prior to Visit  Medication Sig Dispense Refill   hydroquinone 4 % cream Apply 1 a small amount to affected area twice a day  Apply cream to darker areas twice a day for two months, then one month off     Multiple Vitamin (MULTI VITAMIN DAILY PO) Take by mouth. Take 2 gummies by mouth daily.  omeprazole (PRILOSEC) 20 MG capsule TAKE 1 CAPSULE TWICE A DAY 60 capsule 1   No current facility-administered medications on file prior to visit.    No Known Allergies  Family History  Problem Relation Age of Onset   Pneumonia Father    Alcohol abuse Father    Colon polyps Mother    Breast cancer Paternal Aunt    Diabetes Maternal Grandfather    Prostate cancer Paternal Grandfather    Breast cancer Paternal Aunt    Rectal cancer Neg Hx    Stomach cancer Neg Hx    Colon cancer Neg Hx     BP 110/78    Pulse 83    Ht 5\' 5"  (1.651 m)    Wt 170 lb 6.4 oz (77.3 kg)    SpO2 95%    BMI 28.36 kg/m    Review of Systems Denies sob.      Objective:   Physical Exam VITAL SIGNS:  See vs page GENERAL: no distress NECK: thyroid is 3-5 times normal size (R>L).  No palpable  nodule.    Lab Results  Component Value Date   TSH 0.91 06/21/2020   Prolactin: normal    Assessment & Plan:  MNG: clinically stable.  Euthyroid Hyperthyroidism: has not recurred off tapazole, but it will with time.  Hyperprolactinemia: normal off rx.   Patient Instructions  Blood tests are requested for you today.  We'll let you know about the results.  most of the time, a "lumpy thyroid" will eventually become overactive again.  this is usually a slow process, happening over the span of many years.  Please come back for a follow-up appointment in 6 months, when we'll plan to recheck the ultrasound.

## 2020-06-21 NOTE — Patient Instructions (Signed)
Blood tests are requested for you today.  We'll let you know about the results.  most of the time, a "lumpy thyroid" will eventually become overactive again.  this is usually a slow process, happening over the span of many years.  Please come back for a follow-up appointment in 6 months, when we'll plan to recheck the ultrasound.

## 2020-07-23 ENCOUNTER — Other Ambulatory Visit: Payer: Self-pay

## 2020-07-23 ENCOUNTER — Encounter: Payer: Self-pay | Admitting: Internal Medicine

## 2020-07-23 ENCOUNTER — Ambulatory Visit: Payer: Commercial Managed Care - PPO | Admitting: Internal Medicine

## 2020-07-23 VITALS — BP 136/84 | HR 78 | Temp 98.7°F | Ht 65.0 in | Wt 170.0 lb

## 2020-07-23 DIAGNOSIS — N951 Menopausal and female climacteric states: Secondary | ICD-10-CM | POA: Diagnosis not present

## 2020-07-23 DIAGNOSIS — N926 Irregular menstruation, unspecified: Secondary | ICD-10-CM

## 2020-07-23 DIAGNOSIS — Z833 Family history of diabetes mellitus: Secondary | ICD-10-CM

## 2020-07-23 DIAGNOSIS — D649 Anemia, unspecified: Secondary | ICD-10-CM | POA: Diagnosis not present

## 2020-07-23 DIAGNOSIS — N920 Excessive and frequent menstruation with regular cycle: Secondary | ICD-10-CM | POA: Diagnosis not present

## 2020-07-23 MED ORDER — GABAPENTIN 100 MG PO CAPS: 100.0000 mg | ORAL_CAPSULE | Freq: Every day | ORAL | 3 refills | Status: DC

## 2020-07-23 NOTE — Progress Notes (Signed)
Chief Complaint  Patient presents with  . Insomnia    Started on 06/30/2020, patient stated that her nerves are shot  . Hot Flashes    having an irregular menstrual period  . Night Sweats    increased fluids, extreme sweating    HPI: Crystal Mcintyre 44 y.o. come in for Acute sda visit today for above sx   Problem over  The last month.   Emotional  Hot flushes   "thinks going through  The change   " fairly severe sx since July 3 in past short lived lmp July 3 bled ofr 7+ days and then spotted for 1-2 weeks   Prev menses June 23-28   " no sleep"  recently  Sweaty and  Drenched and has to take off clothes  Husband is  Freezing  Stress and emotional irritable with this at times   Dr Garwin Brothers   In past. Evaluated for fertility   Has hx of period irregularity      Has had  Reg update for thyroid and pl last month per dr Loanne Drilling and all in range .  fam hx dm  ROS: See pertinent positives and negatives per HPI. No cp sob   Has area breast being followed , no tobacco   Past Medical History:  Diagnosis Date  . GOITER, MULTINODULAR 04/18/2009  . Headache(784.0) 06/17/2007  . IRREGULAR MENSTRUATION 06/17/2007  . Other anxiety states 01/30/2010  . THYROID STIMULATING HORMONE, ABNORMAL 01/10/2009  . TOBACCO USE 01/10/2008  . TRANSAMINASES, SERUM, ELEVATED 01/10/2009    Family History  Problem Relation Age of Onset  . Pneumonia Father   . Alcohol abuse Father   . Colon polyps Mother   . Breast cancer Paternal Aunt   . Diabetes Maternal Grandfather   . Prostate cancer Paternal Grandfather   . Breast cancer Paternal Aunt   . Rectal cancer Neg Hx   . Stomach cancer Neg Hx   . Colon cancer Neg Hx     Social History   Socioeconomic History  . Marital status: Married    Spouse name: Not on file  . Number of children: Not on file  . Years of education: Not on file  . Highest education level: Not on file  Occupational History  . Occupation: Intake Coordinator at [employer]  Tobacco Use  . Smoking status: Former Smoker    Quit date: 12/29/2008    Years since quitting: 11.5  . Smokeless tobacco: Never Used  Vaping Use  . Vaping Use: Never used  Substance and Sexual Activity  . Alcohol use: Yes    Comment: occasional  . Drug use: No  . Sexual activity: Yes    Partners: Male  Other Topics Concern  . Not on file  Social History Narrative   HH of 2   Pet dog   Bu accounts credits and [employer]   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      Social Determinants of Health   Financial Resource Strain:   . Difficulty of Paying Living Expenses:   Food Insecurity:   . Worried About Charity fundraiser in the Last Year:   . Arboriculturist in the Last Year:   Transportation Needs:   . Film/video editor (Medical):   Marland Kitchen Lack of Transportation (Non-Medical):   Physical Activity:   . Days of Exercise per Week:   . Minutes of Exercise  per Session:   Stress:   . Feeling of Stress :   Social Connections:   . Frequency of Communication with Friends and Family:   . Frequency of Social Gatherings with Friends and Family:   . Attends Religious Services:   . Active Member of Clubs or Organizations:   . Attends Archivist Meetings:   Marland Kitchen Marital Status:     Outpatient Medications Prior to Visit  Medication Sig Dispense Refill  . Ferrous Sulfate (IRON PO) Take 1 tablet by mouth daily.    . hydroquinone 4 % cream Apply 1 a small amount to affected area twice a day  Apply cream to darker areas twice a day for two months, then one month off    . Multiple Vitamin (MULTI VITAMIN DAILY PO) Take by mouth. Take 2 gummies by mouth daily.    . NON FORMULARY Amberen takes 1 white pill and one red pill in the morning at breakfast    . omeprazole (PRILOSEC) 20 MG capsule TAKE 1 CAPSULE TWICE A DAY 60 capsule 1  . zonisamide (ZONEGRAN) 25 MG capsule Take 75 mg by mouth daily.     No facility-administered medications  prior to visit.     EXAM:  BP (!) 136/84   Pulse 78   Temp 98.7 F (37.1 C) (Oral)   Ht 5\' 5"  (1.651 m)   Wt 170 lb (77.1 kg)   SpO2 95%   BMI 28.29 kg/m   Body mass index is 28.29 kg/m.  GENERAL: vitals reviewed and listed above, alert, oriented, appears well hydrated and in no acute distress HEENT: atraumatic, conjunctiva  clear, no obvious abnormalities on inspection of external nose and ears OP : masked  NECK: no obvious masses on inspection palpation  LUNGS: clear to auscultation bilaterally, no wheezes, rales or rhonchi, good air movement CV: HRRR, no clubbing cyanosis or  peripheral edema nl cap refill  Abdomen:  Sof,t normal bowel sounds without hepatosplenomegaly, no guarding rebound or masses no CVA tenderness MS: moves all extremities without noticeable focal  abnormality PSYCH: pleasant and cooperative, no obvious depression or anxiety Lab Results  Component Value Date   WBC 3.3 (L) 12/02/2018   HGB 10.7 (L) 12/02/2018   HCT 33.3 (L) 12/02/2018   PLT 360.0 12/02/2018   GLUCOSE 83 12/02/2018   CHOL 198 12/02/2018   TRIG 53.0 12/02/2018   HDL 62.70 12/02/2018   LDLCALC 125 (H) 12/02/2018   ALT 10 12/02/2018   AST 14 12/02/2018   NA 139 12/02/2018   K 4.5 12/02/2018   CL 105 12/02/2018   CREATININE 0.70 12/02/2018   BUN 10 12/02/2018   CO2 29 12/02/2018   TSH 0.91 06/21/2020   BP Readings from Last 3 Encounters:  07/23/20 (!) 136/84  06/21/20 110/78  02/22/20 118/66    ASSESSMENT AND PLAN:  Discussed the following assessment and plan:  Perimenopausal - Plan: CBC with Differential/Platelet, Hepatic function panel, Hemoglobin A1c, Lipid panel, BASIC METABOLIC PANEL WITH GFR, Follicle Stimulating Hormone, Hemoglobin A1c, Ambulatory referral to Gynecology  Prolonged periods - Plan: CBC with Differential/Platelet, Hepatic function panel, Hemoglobin A1c, Lipid panel, BASIC METABOLIC PANEL WITH GFR, Follicle Stimulating Hormone, Hemoglobin A1c, Ambulatory  referral to Gynecology  Menstrual spotting - Plan: CBC with Differential/Platelet, Hepatic function panel, Hemoglobin A1c, Lipid panel, BASIC METABOLIC PANEL WITH GFR, Follicle Stimulating Hormone, Hemoglobin A1c, Ambulatory referral to Gynecology  Hot flushes, perimenopausal - Plan: CBC with Differential/Platelet, Hepatic function panel, Hemoglobin A1c, Lipid panel, BASIC METABOLIC PANEL  WITH GFR, Follicle Stimulating Hormone, Hemoglobin A1c, Ambulatory referral to Gynecology  Anemia, unspecified type - Plan: CBC with Differential/Platelet, Hepatic function panel, Hemoglobin A1c, Lipid panel, BASIC METABOLIC PANEL WITH GFR, Follicle Stimulating Hormone, Hemoglobin A1c  Family history of diabetes mellitus - Plan: CBC with Differential/Platelet, Hepatic function panel, Hemoglobin A1c, Lipid panel, BASIC METABOLIC PANEL WITH GFR, Follicle Stimulating Hormone, Hemoglobin A1c Plan gyne referral for abn bleeding  And other help with perimenopause as indicated  perimenopausal sx   Intrusive   At this time  Due for  Lab fasting  Delayed  From covid  Changes  Has had covid vaccine . Lab appt  Trial gabapentin at night  -Patient advised to return or notify health care team  if  new concerns arise.  Patient Instructions  Plan  fasting lab   Try temporarily  Gabapentin at night to see if helps some.  Doing a [facility] referral about the   Spotting and help with the sx  To see if other can helps.    Perimenopause  Perimenopause is the normal time of life before and after menstrual periods stop completely (menopause). Perimenopause can begin 2-8 years before menopause, and it usually lasts for 1 year after menopause. During perimenopause, the ovaries may or may not produce an egg. What are the causes? This condition is caused by a natural change in hormone levels that happens as you get older. What increases the risk? This condition is more likely to start at an earlier age if you have certain medical  conditions or treatments, including:  A tumor of the pituitary gland in the brain.  A disease that affects the ovaries and hormone production.  Radiation treatment for cancer.  Certain cancer treatments, such as chemotherapy or hormone (anti-estrogen) therapy.  Heavy smoking and excessive alcohol use.  Family history of early menopause. What are the signs or symptoms? Perimenopausal changes affect each woman differently. Symptoms of this condition may include:  Hot flashes.  Night sweats.  Irregular menstrual periods.  Decreased sex drive.  Vaginal dryness.  Headaches.  Mood swings.  Depression.  Memory problems or trouble concentrating.  Irritability.  Tiredness.  Weight gain.  Anxiety.  Trouble getting pregnant. How is this diagnosed? This condition is diagnosed based on your medical history, a physical exam, your age, your menstrual history, and your symptoms. Hormone tests may also be done. How is this treated? In some cases, no treatment is needed. You and your health care provider should make a decision together about whether treatment is necessary. Treatment will be based on your individual condition and preferences. Various treatments are available, such as:  Menopausal hormone therapy (MHT).  Medicines to treat specific symptoms.  Acupuncture.  Vitamin or herbal supplements. Before starting treatment, make sure to let your health care provider know if you have a personal or family history of:  Heart disease.  Breast cancer.  Blood clots.  Diabetes.  Osteoporosis. Follow these instructions at home: Lifestyle  Do not use any products that contain nicotine or tobacco, such as cigarettes and e-cigarettes. If you need help quitting, ask your health care provider.  Eat a balanced diet that includes fresh fruits and vegetables, whole grains, soybeans, eggs, lean meat, and low-fat dairy.  Get at least 30 minutes of physical activity on 5 or  more days each week.  Avoid alcoholic and caffeinated beverages, as well as spicy foods. This may help prevent hot flashes.  Get 7-8 hours of sleep each night.  Dress in  layers that can be removed to help you manage hot flashes.  Find ways to manage stress, such as deep breathing, meditation, or journaling. General instructions  Keep track of your menstrual periods, including: ? When they occur. ? How heavy they are and how long they last. ? How much time passes between periods.  Keep track of your symptoms, noting when they start, how often you have them, and how long they last.  Take over-the-counter and prescription medicines only as told by your health care provider.  Take vitamin supplements only as told by your health care provider. These may include calcium, vitamin E, and vitamin D.  Use vaginal lubricants or moisturizers to help with vaginal dryness and improve comfort during sex.  Talk with your health care provider before starting any herbal supplements.  Keep all follow-up visits as told by your health care provider. This is important. This includes any group therapy or counseling. Contact a health care provider if:  You have heavy vaginal bleeding or pass blood clots.  Your period lasts more than 2 days longer than normal.  Your periods are recurring sooner than 21 days.  You bleed after having sex. Get help right away if:  You have chest pain, trouble breathing, or trouble talking.  You have severe depression.  You have pain when you urinate.  You have severe headaches.  You have vision problems. Summary  Perimenopause is the time when a woman's body begins to move into menopause. This may happen naturally or as a result of other health problems or medical treatments.  Perimenopause can begin 2-8 years before menopause, and it usually lasts for 1 year after menopause.  Perimenopausal symptoms can be managed through medicines, lifestyle changes, and  complementary therapies such as acupuncture. This information is not intended to replace advice given to you by your health care provider. Make sure you discuss any questions you have with your health care provider. Document Revised: 11/27/2017 Document Reviewed: 01/20/2017 Elsevier Patient Education  2020 [place] Renne Cornick M.D.

## 2020-07-23 NOTE — Patient Instructions (Signed)
Plan  fasting lab   Try temporarily  Gabapentin at night to see if helps some.  Doing a [facility] referral about the   Spotting and help with the sx  To see if other can helps.    Perimenopause  Perimenopause is the normal time of life before and after menstrual periods stop completely (menopause). Perimenopause can begin 2-8 years before menopause, and it usually lasts for 1 year after menopause. During perimenopause, the ovaries may or may not produce an egg. What are the causes? This condition is caused by a natural change in hormone levels that happens as you get older. What increases the risk? This condition is more likely to start at an earlier age if you have certain medical conditions or treatments, including:  A tumor of the pituitary gland in the brain.  A disease that affects the ovaries and hormone production.  Radiation treatment for cancer.  Certain cancer treatments, such as chemotherapy or hormone (anti-estrogen) therapy.  Heavy smoking and excessive alcohol use.  Family history of early menopause. What are the signs or symptoms? Perimenopausal changes affect each woman differently. Symptoms of this condition may include:  Hot flashes.  Night sweats.  Irregular menstrual periods.  Decreased sex drive.  Vaginal dryness.  Headaches.  Mood swings.  Depression.  Memory problems or trouble concentrating.  Irritability.  Tiredness.  Weight gain.  Anxiety.  Trouble getting pregnant. How is this diagnosed? This condition is diagnosed based on your medical history, a physical exam, your age, your menstrual history, and your symptoms. Hormone tests may also be done. How is this treated? In some cases, no treatment is needed. You and your health care provider should make a decision together about whether treatment is necessary. Treatment will be based on your individual condition and preferences. Various treatments are available, such as:  Menopausal  hormone therapy (MHT).  Medicines to treat specific symptoms.  Acupuncture.  Vitamin or herbal supplements. Before starting treatment, make sure to let your health care provider know if you have a personal or family history of:  Heart disease.  Breast cancer.  Blood clots.  Diabetes.  Osteoporosis. Follow these instructions at home: Lifestyle  Do not use any products that contain nicotine or tobacco, such as cigarettes and e-cigarettes. If you need help quitting, ask your health care provider.  Eat a balanced diet that includes fresh fruits and vegetables, whole grains, soybeans, eggs, lean meat, and low-fat dairy.  Get at least 30 minutes of physical activity on 5 or more days each week.  Avoid alcoholic and caffeinated beverages, as well as spicy foods. This may help prevent hot flashes.  Get 7-8 hours of sleep each night.  Dress in layers that can be removed to help you manage hot flashes.  Find ways to manage stress, such as deep breathing, meditation, or journaling. General instructions  Keep track of your menstrual periods, including: ? When they occur. ? How heavy they are and how long they last. ? How much time passes between periods.  Keep track of your symptoms, noting when they start, how often you have them, and how long they last.  Take over-the-counter and prescription medicines only as told by your health care provider.  Take vitamin supplements only as told by your health care provider. These may include calcium, vitamin E, and vitamin D.  Use vaginal lubricants or moisturizers to help with vaginal dryness and improve comfort during sex.  Talk with your health care provider before starting any herbal  supplements.  Keep all follow-up visits as told by your health care provider. This is important. This includes any group therapy or counseling. Contact a health care provider if:  You have heavy vaginal bleeding or pass blood clots.  Your period  lasts more than 2 days longer than normal.  Your periods are recurring sooner than 21 days.  You bleed after having sex. Get help right away if:  You have chest pain, trouble breathing, or trouble talking.  You have severe depression.  You have pain when you urinate.  You have severe headaches.  You have vision problems. Summary  Perimenopause is the time when a woman's body begins to move into menopause. This may happen naturally or as a result of other health problems or medical treatments.  Perimenopause can begin 2-8 years before menopause, and it usually lasts for 1 year after menopause.  Perimenopausal symptoms can be managed through medicines, lifestyle changes, and complementary therapies such as acupuncture. This information is not intended to replace advice given to you by your health care provider. Make sure you discuss any questions you have with your health care provider. Document Revised: 11/27/2017 Document Reviewed: 01/20/2017 Elsevier Patient Education  2020 [employer].

## 2020-08-02 ENCOUNTER — Other Ambulatory Visit: Payer: Commercial Managed Care - PPO

## 2020-08-02 ENCOUNTER — Other Ambulatory Visit: Payer: Self-pay

## 2020-08-02 DIAGNOSIS — Z833 Family history of diabetes mellitus: Secondary | ICD-10-CM

## 2020-08-02 DIAGNOSIS — N951 Menopausal and female climacteric states: Secondary | ICD-10-CM

## 2020-08-02 DIAGNOSIS — D649 Anemia, unspecified: Secondary | ICD-10-CM

## 2020-08-02 DIAGNOSIS — N920 Excessive and frequent menstruation with regular cycle: Secondary | ICD-10-CM

## 2020-08-02 DIAGNOSIS — N926 Irregular menstruation, unspecified: Secondary | ICD-10-CM

## 2020-08-03 LAB — CBC WITH DIFFERENTIAL/PLATELET
Absolute Monocytes: 554 cells/uL (ref 200–950)
Basophils Absolute: 21 cells/uL (ref 0–200)
Basophils Relative: 0.5 %
Eosinophils Absolute: 90 cells/uL (ref 15–500)
Eosinophils Relative: 2.2 %
HCT: 32.5 % — ABNORMAL LOW (ref 35.0–45.0)
Hemoglobin: 9.7 g/dL — ABNORMAL LOW (ref 11.7–15.5)
Lymphs Abs: 1419 cells/uL (ref 850–3900)
MCH: 21.9 pg — ABNORMAL LOW (ref 27.0–33.0)
MCHC: 29.8 g/dL — ABNORMAL LOW (ref 32.0–36.0)
MCV: 73.5 fL — ABNORMAL LOW (ref 80.0–100.0)
MPV: 10.8 fL (ref 7.5–12.5)
Monocytes Relative: 13.5 %
Neutro Abs: 2017 cells/uL (ref 1500–7800)
Neutrophils Relative %: 49.2 %
Platelets: 330 10*3/uL (ref 140–400)
RBC: 4.42 10*6/uL (ref 3.80–5.10)
RDW: 20.2 % — ABNORMAL HIGH (ref 11.0–15.0)
Total Lymphocyte: 34.6 %
WBC: 4.1 10*3/uL (ref 3.8–10.8)

## 2020-08-03 LAB — BASIC METABOLIC PANEL WITH GFR
BUN: 11 mg/dL (ref 7–25)
CO2: 27 mmol/L (ref 20–32)
Calcium: 9.6 mg/dL (ref 8.6–10.2)
Chloride: 106 mmol/L (ref 98–110)
Creat: 0.81 mg/dL (ref 0.50–1.10)
GFR, Est African American: 102 mL/min/{1.73_m2} (ref >=60)
GFR, Est Non African American: 88 mL/min/{1.73_m2} (ref >=60)
Glucose, Bld: 77 mg/dL (ref 65–99)
Potassium: 4.4 mmol/L (ref 3.5–5.3)
Sodium: 140 mmol/L (ref 135–146)

## 2020-08-03 LAB — LIPID PANEL
Cholesterol: 194 mg/dL (ref <200)
HDL: 69 mg/dL (ref >=50)
LDL Cholesterol (Calc): 108 mg/dL (calc) — ABNORMAL HIGH
Non-HDL Cholesterol (Calc): 125 mg/dL (calc) (ref <130)
Total CHOL/HDL Ratio: 2.8 (calc) (ref <5.0)
Triglycerides: 80 mg/dL (ref <150)

## 2020-08-03 LAB — HEPATIC FUNCTION PANEL
AG Ratio: 1.8 (calc) (ref 1.0–2.5)
ALT: 9 U/L (ref 6–29)
AST: 16 U/L (ref 10–35)
Albumin: 4.4 g/dL (ref 3.6–5.1)
Alkaline phosphatase (APISO): 73 U/L (ref 31–125)
Bilirubin, Direct: 0.1 mg/dL (ref 0.0–0.2)
Globulin: 2.5 g/dL (calc) (ref 1.9–3.7)
Indirect Bilirubin: 0.1 mg/dL (calc) — ABNORMAL LOW (ref 0.2–1.2)
Total Bilirubin: 0.2 mg/dL (ref 0.2–1.2)
Total Protein: 6.9 g/dL (ref 6.1–8.1)

## 2020-08-03 LAB — HEMOGLOBIN A1C
Hgb A1c MFr Bld: 4.9 % of total Hgb (ref <5.7)
Mean Plasma Glucose: 94 (calc)
eAG (mmol/L): 5.2 (calc)

## 2020-08-03 LAB — FOLLICLE STIMULATING HORMONE: FSH: 39.4 m[IU]/mL

## 2020-08-05 NOTE — Progress Notes (Signed)
Fsh consistent with  perimenopause  Kidney and liver are ok  No diabetes  You are more anemic than lat check here over a year ago .  Presuming  it is an iron deficiency .   Can mak you feel tired  Take iron supplement every day or every other day  Check cbcdiff ferritin in about a month  dx anemia

## 2020-08-06 ENCOUNTER — Other Ambulatory Visit: Payer: Self-pay

## 2020-08-06 DIAGNOSIS — D649 Anemia, unspecified: Secondary | ICD-10-CM

## 2020-08-21 ENCOUNTER — Other Ambulatory Visit: Payer: Self-pay

## 2020-08-21 ENCOUNTER — Ambulatory Visit
Admission: RE | Admit: 2020-08-21 | Discharge: 2020-08-21 | Disposition: A | Discharge status: Home / Self Care | Payer: Commercial Managed Care - PPO | Source: Ambulatory Visit | Attending: Obstetrics and Gynecology | Admitting: Obstetrics and Gynecology

## 2020-08-21 DIAGNOSIS — N6320 Unspecified lump in the left breast, unspecified quadrant: Secondary | ICD-10-CM

## 2020-09-11 ENCOUNTER — Other Ambulatory Visit (INDEPENDENT_AMBULATORY_CARE_PROVIDER_SITE_OTHER): Payer: Commercial Managed Care - PPO

## 2020-09-11 ENCOUNTER — Other Ambulatory Visit: Payer: Self-pay

## 2020-09-11 DIAGNOSIS — D649 Anemia, unspecified: Secondary | ICD-10-CM

## 2020-09-11 LAB — CBC WITH DIFFERENTIAL/PLATELET
Absolute Monocytes: 548 cells/uL (ref 200–950)
Basophils Absolute: 30 cells/uL (ref 0–200)
Basophils Relative: 0.8 %
Eosinophils Absolute: 41 cells/uL (ref 15–500)
Eosinophils Relative: 1.1 %
HCT: 37 % (ref 35.0–45.0)
Hemoglobin: 11.3 g/dL — ABNORMAL LOW (ref 11.7–15.5)
Lymphs Abs: 1506 cells/uL (ref 850–3900)
MCH: 24.4 pg — ABNORMAL LOW (ref 27.0–33.0)
MCHC: 30.5 g/dL — ABNORMAL LOW (ref 32.0–36.0)
MCV: 79.9 fL — ABNORMAL LOW (ref 80.0–100.0)
MPV: 11.2 fL (ref 7.5–12.5)
Monocytes Relative: 14.8 %
Neutro Abs: 1576 cells/uL (ref 1500–7800)
Neutrophils Relative %: 42.6 %
Platelets: 286 10*3/uL (ref 140–400)
RBC: 4.63 10*6/uL (ref 3.80–5.10)
RDW: 20.1 % — ABNORMAL HIGH (ref 11.0–15.0)
Total Lymphocyte: 40.7 %
WBC: 3.7 10*3/uL — ABNORMAL LOW (ref 3.8–10.8)

## 2020-09-11 LAB — FERRITIN: Ferritin: 7 ng/mL — ABNORMAL LOW (ref 16–232)

## 2020-10-04 NOTE — Progress Notes (Signed)
Anemia is improved but still  Iron deficient  continue iron Supplementation.  Suggest   repeat cbc  ferritin ibc  in 3-4 months  dx iron deficiency.

## 2020-10-22 ENCOUNTER — Telehealth: Payer: Self-pay

## 2020-10-22 DIAGNOSIS — M4120 Other idiopathic scoliosis, site unspecified: Secondary | ICD-10-CM

## 2020-10-22 DIAGNOSIS — N62 Hypertrophy of breast: Secondary | ICD-10-CM

## 2020-10-22 NOTE — Telephone Encounter (Signed)
New message   Need referral to be reenter back to [facility] on [facility] patient voiced she forgot about the referral.

## 2020-10-22 NOTE — Telephone Encounter (Signed)
Okay to re-enter?

## 2020-10-24 ENCOUNTER — Other Ambulatory Visit: Payer: Self-pay

## 2020-10-24 DIAGNOSIS — M4120 Other idiopathic scoliosis, site unspecified: Secondary | ICD-10-CM

## 2020-10-24 DIAGNOSIS — G8929 Other chronic pain: Secondary | ICD-10-CM

## 2020-10-24 NOTE — Telephone Encounter (Signed)
Referral entered  

## 2020-10-24 NOTE — Telephone Encounter (Signed)
Ok to  Reenter referral (  I assume this was the feb referral for PT? )

## 2020-11-13 ENCOUNTER — Ambulatory Visit: Payer: Commercial Managed Care - PPO | Attending: Internal Medicine | Admitting: Physical Therapy

## 2020-12-11 ENCOUNTER — Ambulatory Visit: Payer: Commercial Managed Care - PPO | Attending: Internal Medicine | Admitting: Physical Therapy

## 2020-12-13 ENCOUNTER — Ambulatory Visit: Payer: Commercial Managed Care - PPO | Admitting: Endocrinology

## 2020-12-23 ENCOUNTER — Other Ambulatory Visit: Payer: Self-pay | Admitting: Internal Medicine

## 2020-12-31 ENCOUNTER — Ambulatory Visit: Payer: Commercial Managed Care - PPO | Admitting: Endocrinology

## 2021-02-20 IMAGING — US US THYROID
1 series · 13 of 25 positions shown · non-contrast
Comparison: November 25, 2017

CLINICAL DATA: Thyroid nodule follow-up

EXAM:
THYROID ULTRASOUND
TECHNIQUE: Ultrasound examination of the thyroid gland and adjacent soft
tissues was performed.

[Series 1: us thyroid · 0.06mm/px · 13 of 81 slices shown]
[im 1/81]
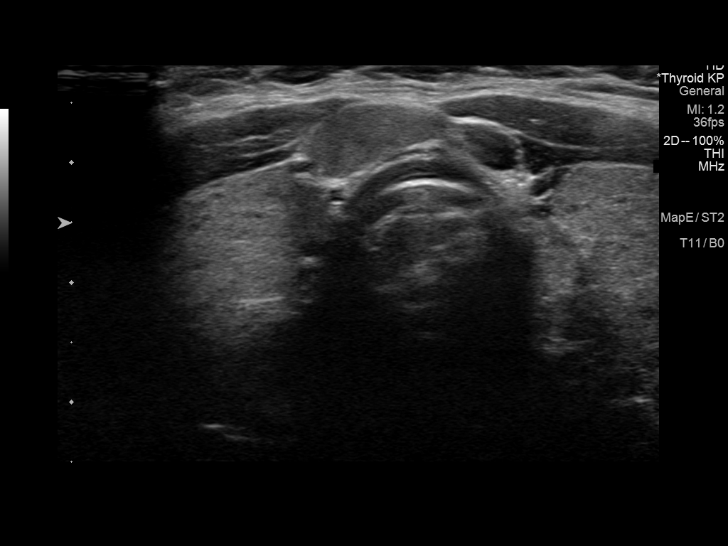
[im 7/81]
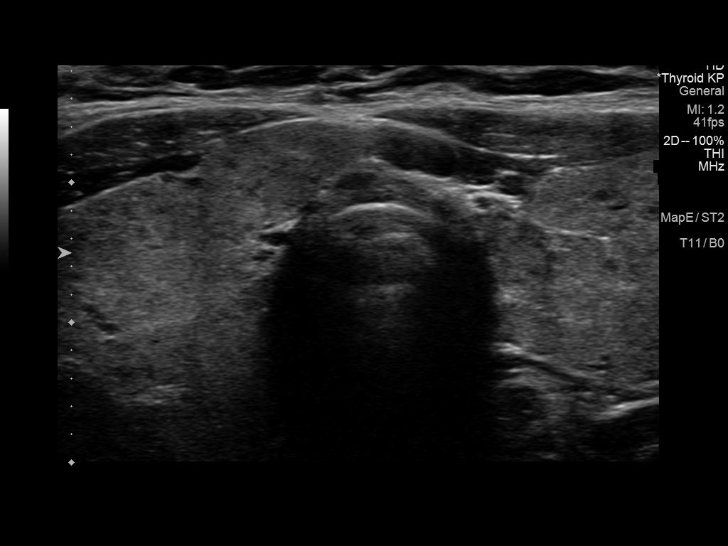
[im 14/81]
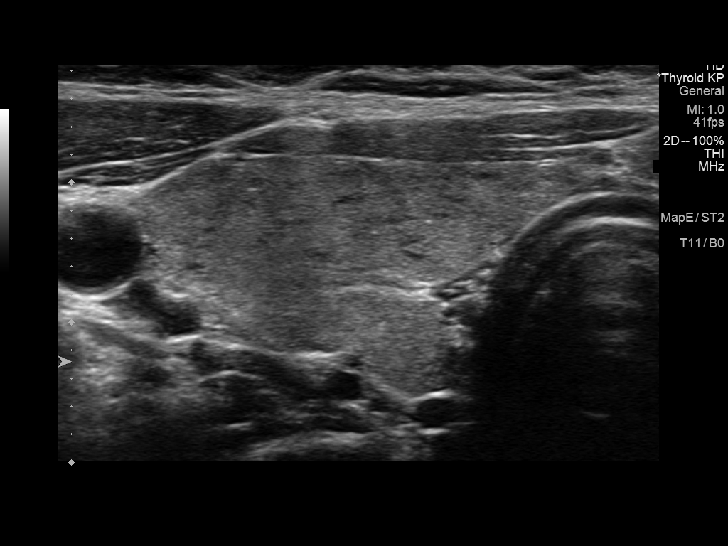
[im 21/81]
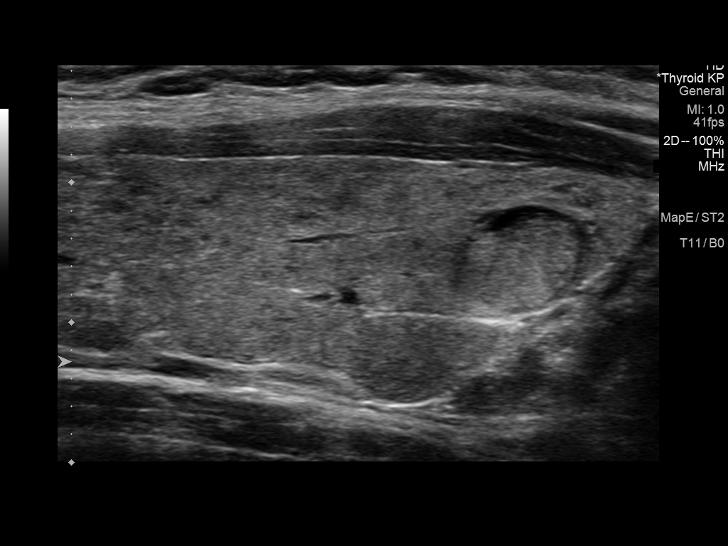
[im 27/81]
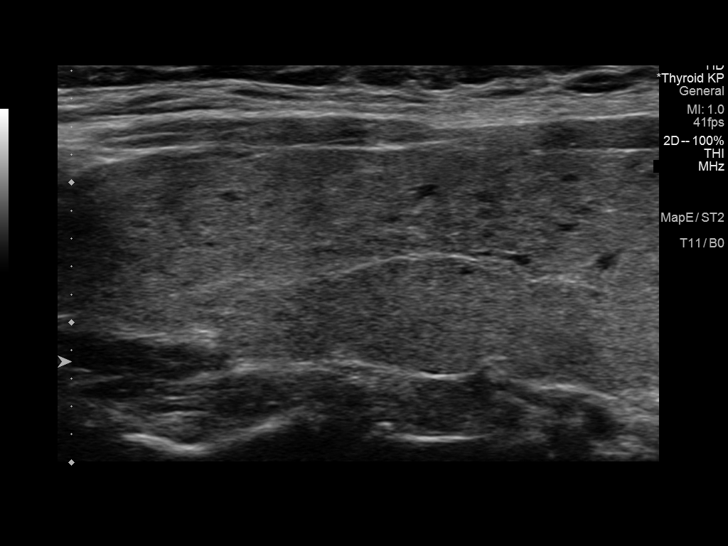
[im 34/81]
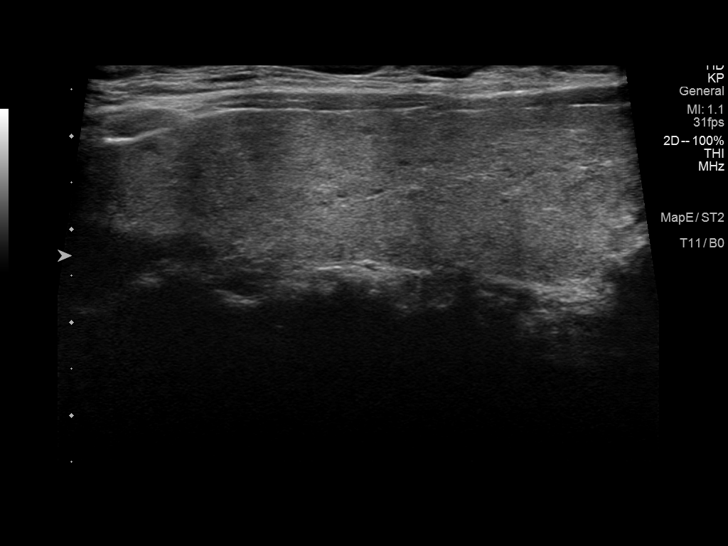
[im 41/81]
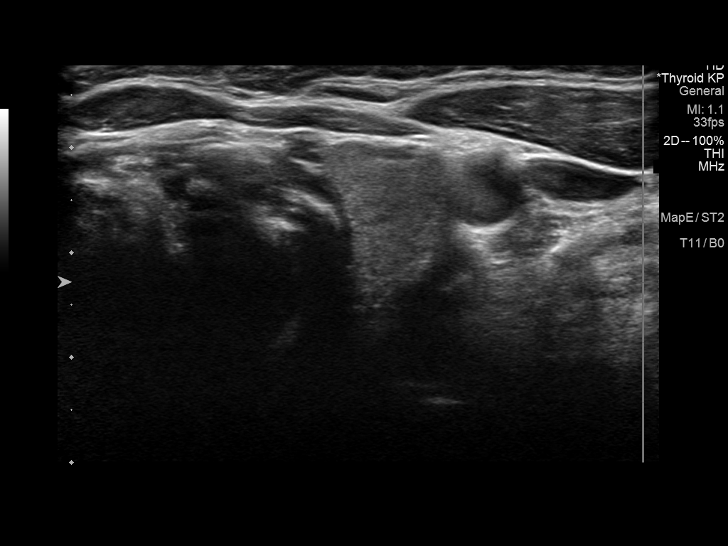
[im 47/81]
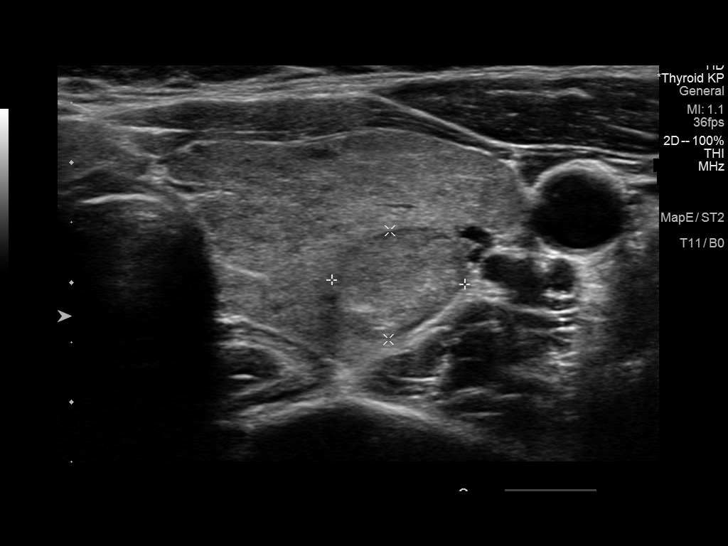
[im 54/81]
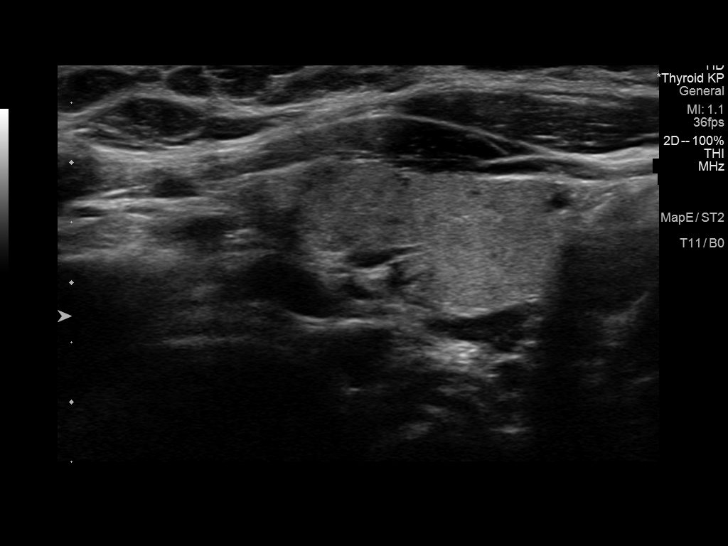
[im 61/81]
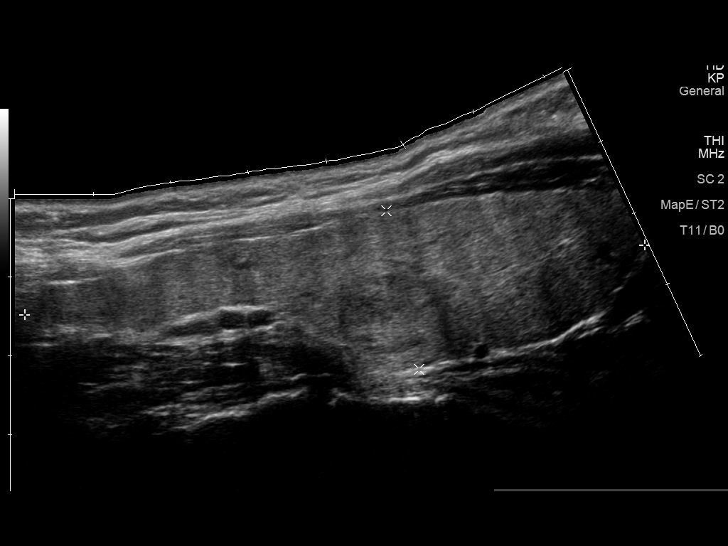
[im 67/81]
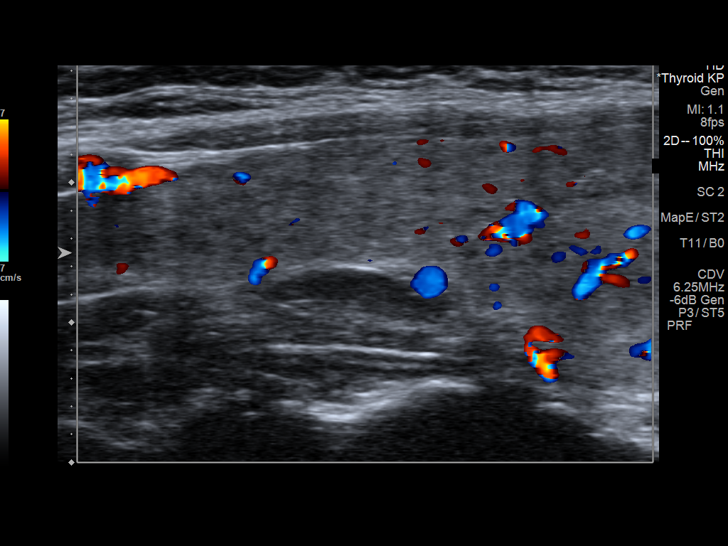
[im 74/81]
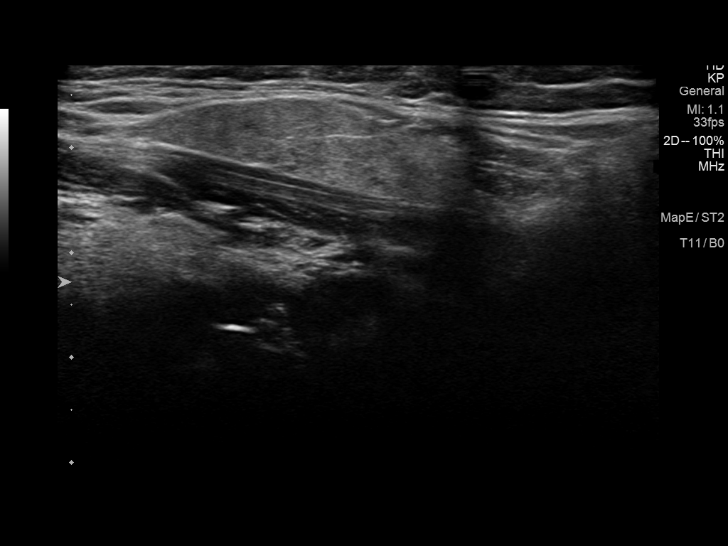
[im 81/81]
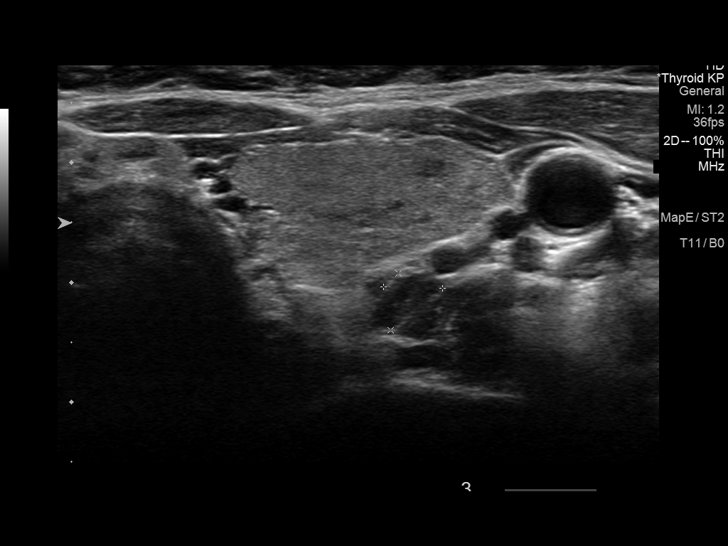

[13 of 25 positions shown; findings below may reference images not displayed]

FINDINGS: Parenchymal Echotexture: Mildly heterogenous

Isthmus: 0.4 cm

Right lobe: 8.1 x 1.9 x 2.7 cm

Left lobe: 7.9 x 2.1 x 2.8 cm

_________________________________________________________

Estimated total number of nodules >/= 1 cm: 3

Number of spongiform nodules >/=  2 cm not described below (TR1): 0

Number of mixed cystic and solid nodules >/= 1.5 cm not described
below (TR2): 0

_________________________________________________________

Nodule # 1:

Prior biopsy: No

Location: Right; Inferior

Maximum size: 1 cm; Other 2 dimensions: 0.9 x 0.7 cm, previously,
0.6 x 0.6 x 0.5 cm

Composition: solid/almost completely solid (2)

Echogenicity: isoechoic (1)

Shape: not taller-than-wide (0)

Margins: smooth (0)

Echogenic foci: none (0)

ACR TI-RADS total points: 3.

ACR TI-RADS risk category:  TR3 (3 points).

Significant change in size (>/= 20% in two dimensions and minimal
increase of 2 mm): Yes

Change in features: No

Change in ACR TI-RADS risk category: Yes

ACR TI-RADS recommendations:

Given size (<1.4 cm) and appearance, this nodule does NOT meet
TI-RADS criteria for biopsy or dedicated follow-up.

_________________________________________________________

Nodule # 2:

Prior biopsy: No

Location: Left; Mid

Maximum size: 1.4 cm; Other 2 dimensions: 1.1 x 0.9 cm, previously,
1.1 x 0.9 x 0.7 cm

Composition: solid/almost completely solid (2)

Echogenicity: isoechoic (1)

Shape: not taller-than-wide (0)

Margins: smooth (0)

Echogenic foci: punctate echogenic foci (3)

ACR TI-RADS total points: 6.

ACR TI-RADS risk category:  TR4 (4-6 points).

Significant change in size (>/= 20% in two dimensions and minimal
increase of 2 mm): Yes

Change in features: No

Change in ACR TI-RADS risk category: No

ACR TI-RADS recommendations:

*Given size (>/= 1 - 1.4 cm) and appearance, a follow-up ultrasound
in 1 year should be considered based on TI-RADS criteria.

_________________________________________________________

There is a 1.4 x 0.5 x 0.5 cm hypoechoic nodule posterior to the
thyroid gland on the left as favored to represent a parathyroid
gland.
IMPRESSION: 1. Slight interval growth of a TR 4 thyroid nodule in the left mid
thyroid gland. One year follow-up ultrasound is recommended.
2. 1 cm nodule in the right inferior thyroid gland the does not meet
criteria for follow-up or FNA.

The above is in keeping with the ACR TI-RADS recommendations - [HOSPITAL] 6639;[DATE].

## 2021-03-15 ENCOUNTER — Telehealth: Payer: Self-pay | Admitting: Internal Medicine

## 2021-03-15 DIAGNOSIS — M545 Low back pain, unspecified: Secondary | ICD-10-CM

## 2021-03-15 NOTE — Telephone Encounter (Signed)
Pt is calling in needing a refill on gabapentin (NEURONTIN) 100 MG [facility] in [facility] and she would like to have a referral to a orthopedic for her back.  Pt is aware that her provider is not in the office and stated Monday is fine.

## 2021-03-15 NOTE — Telephone Encounter (Signed)
Last refill- 12/24/2020--60 tabs with 3 refills

## 2021-03-19 MED ORDER — GABAPENTIN 100 MG PO CAPS: 100.0000 mg | ORAL_CAPSULE | Freq: Every day | ORAL | 3 refills | Status: DC

## 2021-03-19 NOTE — Telephone Encounter (Signed)
Ok to refill  X 3   Gabapentin  Please referral to  Ortho about the back pain

## 2021-03-19 NOTE — Addendum Note (Signed)
Addended by: Nilda Riggs on: 03/19/2021 10:43 AM   Modules accepted: Orders

## 2021-03-19 NOTE — Telephone Encounter (Signed)
Gabapentin refill sent on 03/22. Referral to Ortho has been placed on 03/22 and patient is aware someone will be contacting her to schedule appointment.

## 2021-03-26 ENCOUNTER — Other Ambulatory Visit: Payer: Self-pay

## 2021-03-26 ENCOUNTER — Encounter: Payer: Self-pay | Admitting: Family Medicine

## 2021-03-26 ENCOUNTER — Ambulatory Visit (INDEPENDENT_AMBULATORY_CARE_PROVIDER_SITE_OTHER): Payer: Commercial Managed Care - PPO | Admitting: Family Medicine

## 2021-03-26 ENCOUNTER — Ambulatory Visit: Payer: Self-pay

## 2021-03-26 DIAGNOSIS — M545 Low back pain, unspecified: Secondary | ICD-10-CM

## 2021-03-26 DIAGNOSIS — G8929 Other chronic pain: Secondary | ICD-10-CM

## 2021-03-26 MED ORDER — BACLOFEN 10 MG PO TABS: 5.0000 mg | ORAL_TABLET | Freq: Every evening | ORAL | 3 refills | Status: DC | PRN

## 2021-03-26 NOTE — Progress Notes (Signed)
Office Visit Note   Patient: Crystal Mcintyre           Date of Birth: 26-Dec-1976           MRN: [id number] Visit Date: 03/26/2021 Requested by: Burnis Medin, MD [place],  [place] [address] PCP: Burnis Medin, MD  Subjective: Chief Complaint  Patient presents with  . Lower Back - Pain    Chronic pain across her lower back. Has "learned to live with it" over the years, but it is getting worse as she gets older. Hurts worse when she gets up - sharp pain in the middle of the lower back. Occasionally, has pain down the right buttock and posterior thigh. Has known scoliosis.    HPI: She is here with chronic low back pain.  She has had pain for at least 10 to 15 years.  She has a history of scoliosis affecting her mid back, but she has had low back pain as well.  Over the past 2 years the pain has gotten steadily worse to the point that it bothers her on a daily basis.  It hurts every morning when she gets up, it is hard to get moving until she "warms up".  She gets occasional sciatica into her right leg.  No bowel or bladder dysfunction.  She has tried gabapentin and some other medications with minimal improvement.  Many years ago she went to a chiropractor but that did not give long-term relief.  She has not been to a physical therapist.  She sees a massage therapist occasionally.  She also has been seeing a Psychiatric nurse and is contemplating reduction mammoplasty.  No personal or family history of rheumatologic disease.  No other joints are bothering her.               ROS:   All other systems were reviewed and are negative.  Objective: Vital Signs: There were no vitals taken for this visit.  Physical Exam:  General:  Alert and oriented, in no acute distress. Pulm:  Breathing unlabored. Psy:  Normal mood, congruent affect. Skin: No rash seen. Low back: He is tender to palpation across the L5-S1 level in the midline.  She has mild tenderness over both SI joints  as well.  She has tenderness in the right sciatic notch.  Straight leg raise is negative, she has good range of motion and no pain with passive internal hip rotation.  Lower extremity strength and reflexes are normal.  Imaging: XR Lumbar Spine 2-3 Views  Result Date: 03/26/2021 X-rays lumbar spine reveal mild thoracolumbar scoliosis.  She has well-preserved disc spaces.  There is moderate degenerative change in the facet joints from L4-S1.  No sign of compression fracture or neoplasm.  Hip joints look normal.   Assessment & Plan: 1.  Chronic low back pain with underlying facet arthropathy, cannot rule out disc protrusion.  Neurologic exam is nonfocal. -We will try physical therapy at [facility].  Baclofen at night as needed.  We will empirically treat with vitamin D3.  If she is not improving over the next 4 to 6 weeks, she will contact me and I will order lumbar MRI scan.     Procedures: No procedures performed        PMFS History: Patient Active Problem List   Diagnosis Date Noted  . Hyperprolactinemia ([place]) 09/14/2015  . Left low back pain 01/04/2014  . Hyperthyroidism 09/17/2013  . GERD (gastroesophageal reflux disease) 08/26/2013  .  Female fertility problem 01/28/2012  . Preventative health care 02/12/2011  . Gynecological examination 02/12/2011  . Insomnia 02/12/2011  . OTHER ANXIETY STATES 01/30/2010  . GOITER, MULTINODULAR 04/18/2009  . TRANSAMINASES, SERUM, ELEVATED 01/10/2009  . IRREGULAR MENSTRUATION 06/17/2007  . HEADACHE 06/17/2007   Past Medical History:  Diagnosis Date  . GOITER, MULTINODULAR 04/18/2009  . Headache(784.0) 06/17/2007  . IRREGULAR MENSTRUATION 06/17/2007  . Other anxiety states 01/30/2010  . THYROID STIMULATING HORMONE, ABNORMAL 01/10/2009  . TOBACCO USE 01/10/2008  . TRANSAMINASES, SERUM, ELEVATED 01/10/2009    Family History  Problem Relation Age of Onset  . Pneumonia Father   . Alcohol abuse Father   . Colon polyps Mother   . Breast  cancer Paternal Aunt   . Diabetes Maternal Grandfather   . Prostate cancer Paternal Grandfather   . Breast cancer Paternal Aunt   . Rectal cancer Neg Hx   . Stomach cancer Neg Hx   . Colon cancer Neg Hx     Past Surgical History:  Procedure Laterality Date  . TONSILLECTOMY  as child  . TYMPANOPLASTY  4 yrs ago  . TYMPANOSTOMY TUBE PLACEMENT  as child  . UPPER GASTROINTESTINAL ENDOSCOPY     Social History   Occupational History  . 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 Smoking status: Former Smoker    Quit date: 12/29/2008    Years since quitting: 12.2  . Smokeless tobacco: Never Used  Vaping Use  . Vaping Use: Never used  Substance and Sexual Activity  . Alcohol use: Yes    Comment: occasional  . Drug use: No  . Sexual activity: Yes    Partners: Male

## 2021-04-02 ENCOUNTER — Other Ambulatory Visit: Payer: Self-pay | Admitting: Obstetrics and Gynecology

## 2021-04-02 DIAGNOSIS — Z1231 Encounter for screening mammogram for malignant neoplasm of breast: Secondary | ICD-10-CM

## 2021-04-26 ENCOUNTER — Telehealth: Payer: Self-pay

## 2021-04-26 MED ORDER — TIZANIDINE HCL 2 MG PO TABS: 2.0000 mg | ORAL_TABLET | Freq: Four times a day (QID) | ORAL | 1 refills | Status: DC | PRN

## 2021-04-26 NOTE — Telephone Encounter (Signed)
Patient called she stated rx for baclofen has not helped her with her back pain patient stated she hasn't been able to schedule any pt sessions due to her working out of town call [phone]

## 2021-04-26 NOTE — Telephone Encounter (Signed)
I called and advised the patient of the change in muscle relaxers. She is fine with the current PT location, she just has not been able to go yet due to being out-of-town a lot for work -- she said she still plans to call them to set up an appointment.

## 2021-04-26 NOTE — Telephone Encounter (Signed)
Zanaflex Rx sent.  I can fax a PT referral to a different town if that would be better.

## 2021-04-26 NOTE — Telephone Encounter (Signed)
Please advise 

## 2021-04-26 NOTE — Addendum Note (Signed)
Addended by: Hortencia Pilar on: 04/26/2021 10:44 AM   Modules accepted: Orders

## 2021-04-29 ENCOUNTER — Telehealth: Payer: Self-pay | Admitting: Internal Medicine

## 2021-04-29 DIAGNOSIS — M25841 Other specified joint disorders, right hand: Secondary | ICD-10-CM

## 2021-04-29 DIAGNOSIS — M71341 Other bursal cyst, right hand: Secondary | ICD-10-CM

## 2021-04-29 DIAGNOSIS — M85641 Other cyst of bone, right hand: Secondary | ICD-10-CM

## 2021-04-29 NOTE — Telephone Encounter (Signed)
The patient is requesting a referral to the hand surgery center. She has a  cyst on the right side of her thumb.    [facility] [address] [place], [place] [address] [phone])

## 2021-04-29 NOTE — Telephone Encounter (Signed)
Ok to do referral?  

## 2021-04-30 ENCOUNTER — Other Ambulatory Visit: Payer: Self-pay | Admitting: Obstetrics and Gynecology

## 2021-04-30 DIAGNOSIS — D259 Leiomyoma of uterus, unspecified: Secondary | ICD-10-CM

## 2021-04-30 NOTE — Telephone Encounter (Signed)
Patient has been informed that orthopedic referral has been placed and someone should be contacting her with appointment information.

## 2021-04-30 NOTE — Telephone Encounter (Signed)
Left a message for the patient to return my call. Referral to orthopedics has been placed.

## 2021-05-09 ENCOUNTER — Other Ambulatory Visit: Payer: Commercial Managed Care - PPO

## 2021-05-23 ENCOUNTER — Other Ambulatory Visit: Payer: Self-pay

## 2021-05-23 ENCOUNTER — Other Ambulatory Visit: Payer: Self-pay | Admitting: Orthopedic Surgery

## 2021-05-23 ENCOUNTER — Ambulatory Visit
Admission: RE | Admit: 2021-05-23 | Discharge: 2021-05-23 | Disposition: A | Discharge status: Home / Self Care | Payer: Commercial Managed Care - PPO | Source: Ambulatory Visit | Attending: Obstetrics and Gynecology | Admitting: Obstetrics and Gynecology

## 2021-05-23 DIAGNOSIS — Z1231 Encounter for screening mammogram for malignant neoplasm of breast: Secondary | ICD-10-CM

## 2021-05-30 ENCOUNTER — Ambulatory Visit
Admission: RE | Admit: 2021-05-30 | Discharge: 2021-05-30 | Disposition: A | Discharge status: Home / Self Care | Payer: Commercial Managed Care - PPO | Source: Ambulatory Visit | Attending: Obstetrics and Gynecology | Admitting: Obstetrics and Gynecology

## 2021-05-30 DIAGNOSIS — D259 Leiomyoma of uterus, unspecified: Secondary | ICD-10-CM

## 2021-06-21 ENCOUNTER — Other Ambulatory Visit: Payer: Self-pay | Admitting: Family Medicine

## 2021-06-25 ENCOUNTER — Encounter (HOSPITAL_BASED_OUTPATIENT_CLINIC_OR_DEPARTMENT_OTHER): Payer: Self-pay | Admitting: Orthopedic Surgery

## 2021-06-25 ENCOUNTER — Other Ambulatory Visit: Payer: Self-pay

## 2021-06-28 NOTE — Progress Notes (Signed)

## 2021-07-04 ENCOUNTER — Encounter (HOSPITAL_BASED_OUTPATIENT_CLINIC_OR_DEPARTMENT_OTHER)
Admission: RE | Disposition: A | Discharge status: Home / Self Care | Payer: Self-pay | Source: Home / Self Care | Attending: Orthopedic Surgery

## 2021-07-04 ENCOUNTER — Ambulatory Visit (HOSPITAL_BASED_OUTPATIENT_CLINIC_OR_DEPARTMENT_OTHER)
Admission: RE | Admit: 2021-07-04 | Discharge: 2021-07-04 | Disposition: A | Discharge status: Home / Self Care | Payer: Commercial Managed Care - PPO | Attending: Orthopedic Surgery | Admitting: Orthopedic Surgery

## 2021-07-04 ENCOUNTER — Ambulatory Visit (HOSPITAL_BASED_OUTPATIENT_CLINIC_OR_DEPARTMENT_OTHER): Payer: Commercial Managed Care - PPO | Admitting: Anesthesiology

## 2021-07-04 ENCOUNTER — Other Ambulatory Visit: Payer: Self-pay

## 2021-07-04 ENCOUNTER — Encounter (HOSPITAL_BASED_OUTPATIENT_CLINIC_OR_DEPARTMENT_OTHER): Payer: Self-pay | Admitting: Orthopedic Surgery

## 2021-07-04 DIAGNOSIS — M19041 Primary osteoarthritis, right hand: Secondary | ICD-10-CM | POA: Diagnosis not present

## 2021-07-04 DIAGNOSIS — Z87891 Personal history of nicotine dependence: Secondary | ICD-10-CM | POA: Diagnosis not present

## 2021-07-04 DIAGNOSIS — Z79899 Other long term (current) drug therapy: Secondary | ICD-10-CM | POA: Insufficient documentation

## 2021-07-04 DIAGNOSIS — M6748 Ganglion, other site: Secondary | ICD-10-CM | POA: Diagnosis not present

## 2021-07-04 HISTORY — PX: MASS EXCISION: SHX2000

## 2021-07-04 LAB — POCT PREGNANCY, URINE: Preg Test, Ur: NEGATIVE

## 2021-07-04 SURGERY — EXCISION MASS
Anesthesia: Monitor Anesthesia Care | Site: Thumb | Laterality: Right

## 2021-07-04 MED ORDER — FENTANYL CITRATE (PF) 100 MCG/2ML IJ SOLN
INTRAMUSCULAR | Status: DC | PRN
  Administered 2021-07-04: 100 ug via INTRAVENOUS

## 2021-07-04 MED ORDER — 0.9 % SODIUM CHLORIDE (POUR BTL) OPTIME
TOPICAL | Status: DC | PRN
  Administered 2021-07-04: 120 mL

## 2021-07-04 MED ORDER — CEFAZOLIN SODIUM-DEXTROSE 2-4 GM/100ML-% IV SOLN
2.0000 g | INTRAVENOUS | Status: AC
  Administered 2021-07-04: 2 g via INTRAVENOUS

## 2021-07-04 MED ORDER — LACTATED RINGERS IV SOLN: INTRAVENOUS | Status: DC

## 2021-07-04 MED ORDER — HYDROCODONE-ACETAMINOPHEN 5-325 MG PO TABS: ORAL_TABLET | ORAL | 0 refills | Status: DC

## 2021-07-04 MED ORDER — MIDAZOLAM HCL 2 MG/2ML IJ SOLN
INTRAMUSCULAR | Status: AC
  Filled 2021-07-04: qty 2

## 2021-07-04 MED ORDER — PROPOFOL 500 MG/50ML IV EMUL
INTRAVENOUS | Status: DC | PRN
  Administered 2021-07-04: 100 ug/kg/min via INTRAVENOUS

## 2021-07-04 MED ORDER — ONDANSETRON HCL 4 MG/2ML IJ SOLN
INTRAMUSCULAR | Status: DC | PRN
  Administered 2021-07-04: 4 mg via INTRAVENOUS

## 2021-07-04 MED ORDER — FENTANYL CITRATE (PF) 100 MCG/2ML IJ SOLN: 25.0000 ug | INTRAMUSCULAR | Status: DC | PRN

## 2021-07-04 MED ORDER — MIDAZOLAM HCL 5 MG/5ML IJ SOLN
INTRAMUSCULAR | Status: DC | PRN
  Administered 2021-07-04: 2 mg via INTRAVENOUS

## 2021-07-04 MED ORDER — BUPIVACAINE HCL (PF) 0.25 % IJ SOLN
INTRAMUSCULAR | Status: DC | PRN
  Administered 2021-07-04: 8 mL

## 2021-07-04 MED ORDER — CEFAZOLIN SODIUM-DEXTROSE 2-4 GM/100ML-% IV SOLN
INTRAVENOUS | Status: AC
  Filled 2021-07-04: qty 100

## 2021-07-04 MED ORDER — OXYCODONE HCL 5 MG PO TABS: 5.0000 mg | ORAL_TABLET | Freq: Once | ORAL | Status: DC | PRN

## 2021-07-04 MED ORDER — ONDANSETRON HCL 4 MG/2ML IJ SOLN
INTRAMUSCULAR | Status: AC
  Filled 2021-07-04: qty 2

## 2021-07-04 MED ORDER — PROPOFOL 10 MG/ML IV BOLUS
INTRAVENOUS | Status: DC | PRN
  Administered 2021-07-04: 20 mg via INTRAVENOUS

## 2021-07-04 MED ORDER — LIDOCAINE HCL (PF) 0.5 % IJ SOLN
INTRAMUSCULAR | Status: DC | PRN
  Administered 2021-07-04: 30 mL via INTRAVENOUS

## 2021-07-04 MED ORDER — ONDANSETRON HCL 4 MG/2ML IJ SOLN: 4.0000 mg | Freq: Once | INTRAMUSCULAR | Status: DC | PRN

## 2021-07-04 MED ORDER — OXYCODONE HCL 5 MG/5ML PO SOLN: 5.0000 mg | Freq: Once | ORAL | Status: DC | PRN

## 2021-07-04 MED ORDER — FENTANYL CITRATE (PF) 100 MCG/2ML IJ SOLN
INTRAMUSCULAR | Status: AC
  Filled 2021-07-04: qty 2

## 2021-07-04 MED ORDER — PROPOFOL 500 MG/50ML IV EMUL
INTRAVENOUS | Status: AC
  Filled 2021-07-04: qty 50

## 2021-07-04 SURGICAL SUPPLY — 53 items
APL PRP STRL LF DISP 70% ISPRP (MISCELLANEOUS) ×1
APL SKNCLS STERI-STRIP NONHPOA (GAUZE/BANDAGES/DRESSINGS)
BENZOIN TINCTURE PRP APPL 2/3 (GAUZE/BANDAGES/DRESSINGS) IMPLANT
BLADE MINI RND TIP GREEN BEAV (BLADE) IMPLANT
BLADE SURG 15 STRL LF DISP TIS (BLADE) ×2 IMPLANT
BLADE SURG 15 STRL SS (BLADE) ×4
BNDG CMPR 9X4 STRL LF SNTH (GAUZE/BANDAGES/DRESSINGS)
BNDG COHESIVE 1X5 TAN STRL LF (GAUZE/BANDAGES/DRESSINGS) IMPLANT
BNDG COHESIVE 2X5 TAN STRL LF (GAUZE/BANDAGES/DRESSINGS) IMPLANT
BNDG CONFORM 2 STRL LF (GAUZE/BANDAGES/DRESSINGS) IMPLANT
BNDG ELASTIC 2X5.8 VLCR STR LF (GAUZE/BANDAGES/DRESSINGS) IMPLANT
BNDG ELASTIC 3X5.8 VLCR STR LF (GAUZE/BANDAGES/DRESSINGS) IMPLANT
BNDG ESMARK 4X9 LF (GAUZE/BANDAGES/DRESSINGS) IMPLANT
BNDG GAUZE 1X2.1 STRL (MISCELLANEOUS) IMPLANT
BNDG GAUZE ELAST 4 BULKY (GAUZE/BANDAGES/DRESSINGS) IMPLANT
BNDG PLASTER X FAST 3X3 WHT LF (CAST SUPPLIES) IMPLANT
BNDG PLSTR 9X3 FST ST WHT (CAST SUPPLIES)
CHLORAPREP W/TINT 26 (MISCELLANEOUS) ×2 IMPLANT
CORD BIPOLAR FORCEPS 12FT (ELECTRODE) ×2 IMPLANT
COVER BACK TABLE 60X90IN (DRAPES) ×2 IMPLANT
COVER MAYO STAND STRL (DRAPES) ×2 IMPLANT
CUFF TOURN SGL QUICK 18X4 (TOURNIQUET CUFF) ×2 IMPLANT
DRAPE EXTREMITY T 121X128X90 (DISPOSABLE) ×2 IMPLANT
DRAPE SURG 17X23 STRL (DRAPES) ×2 IMPLANT
GAUZE SPONGE 4X4 12PLY STRL (GAUZE/BANDAGES/DRESSINGS) ×2 IMPLANT
GAUZE XEROFORM 1X8 LF (GAUZE/BANDAGES/DRESSINGS) ×2 IMPLANT
GLOVE SRG 8 PF TXTR STRL LF DI (GLOVE) ×1 IMPLANT
GLOVE SURG ENC MOIS LTX SZ7.5 (GLOVE) ×2 IMPLANT
GLOVE SURG UNDER POLY LF SZ8 (GLOVE) ×2
GOWN STRL REUS W/ TWL LRG LVL3 (GOWN DISPOSABLE) ×1 IMPLANT
GOWN STRL REUS W/TWL LRG LVL3 (GOWN DISPOSABLE) ×2
GOWN STRL REUS W/TWL XL LVL3 (GOWN DISPOSABLE) ×2 IMPLANT
NEEDLE HYPO 25X1 1.5 SAFETY (NEEDLE) ×2 IMPLANT
NS IRRIG 1000ML POUR BTL (IV SOLUTION) ×2 IMPLANT
PACK BASIN DAY SURGERY FS (CUSTOM PROCEDURE TRAY) ×2 IMPLANT
PAD CAST 3X4 CTTN HI CHSV (CAST SUPPLIES) IMPLANT
PAD CAST 4YDX4 CTTN HI CHSV (CAST SUPPLIES) IMPLANT
PADDING CAST ABS 4INX4YD NS (CAST SUPPLIES) ×1
PADDING CAST ABS COTTON 4X4 ST (CAST SUPPLIES) ×1 IMPLANT
PADDING CAST COTTON 3X4 STRL (CAST SUPPLIES)
PADDING CAST COTTON 4X4 STRL (CAST SUPPLIES)
SPLINT FINGER 2.25 911902 (SOFTGOODS) ×2 IMPLANT
STOCKINETTE 4X48 STRL (DRAPES) ×2 IMPLANT
STRIP CLOSURE SKIN 1/2X4 (GAUZE/BANDAGES/DRESSINGS) IMPLANT
SUT ETHILON 3 0 PS 1 (SUTURE) IMPLANT
SUT ETHILON 4 0 PS 2 18 (SUTURE) ×2 IMPLANT
SUT ETHILON 5 0 P 3 18 (SUTURE)
SUT NYLON ETHILON 5-0 P-3 1X18 (SUTURE) IMPLANT
SUT VIC AB 4-0 P2 18 (SUTURE) IMPLANT
SYR BULB EAR ULCER 3OZ GRN STR (SYRINGE) ×2 IMPLANT
SYR CONTROL 10ML LL (SYRINGE) ×2 IMPLANT
TOWEL GREEN STERILE FF (TOWEL DISPOSABLE) ×4 IMPLANT
UNDERPAD 30X36 HEAVY ABSORB (UNDERPADS AND DIAPERS) ×2 IMPLANT

## 2021-07-04 NOTE — H&P (Signed)
Crystal Mcintyre is an 45 y.o. female.   Chief Complaint: finger cyst HPI: 45 yo female with right thumb cyst x 2 months.  It is bothersome to her.  She wishes to have it removed and the ip joint debrided to try to prevent recurrence.    Allergies: No Known Allergies  Past Medical History:  Diagnosis Date   GOITER, MULTINODULAR 04/18/2009   Headache(784.0) 06/17/2007   IRREGULAR MENSTRUATION 06/17/2007   Other anxiety states 01/30/2010   THYROID STIMULATING HORMONE, ABNORMAL 01/10/2009   TOBACCO USE 01/10/2008   TRANSAMINASES, SERUM, ELEVATED 01/10/2009    Past Surgical History:  Procedure Laterality Date   TONSILLECTOMY  as child   TYMPANOPLASTY  4 yrs ago   TYMPANOSTOMY TUBE PLACEMENT  as child   UPPER GASTROINTESTINAL ENDOSCOPY      Family History: Family History  Problem Relation Age of Onset   Pneumonia Father    Alcohol abuse Father    Colon polyps Mother    Breast cancer Paternal Aunt    Diabetes Maternal Grandfather    Prostate cancer Paternal Grandfather    Breast cancer Paternal Aunt    Rectal cancer Neg Hx    Stomach cancer Neg Hx    Colon cancer Neg Hx     Social History:   reports that she quit smoking about 12 years ago. She has never used smokeless tobacco. She reports current alcohol use. She reports that she does not use drugs.  Medications: Medications Prior to Admission  Medication Sig Dispense Refill   doxycycline (VIBRAMYCIN) 100 MG capsule Take 100 mg by mouth 2 (two) times daily.     Ferrous Sulfate (IRON PO) Take 1 tablet by mouth daily.     gabapentin (NEURONTIN) 100 MG capsule Take 1-3 capsules (100-300 mg total) by mouth at bedtime. 60 capsule 3   levocetirizine (XYZAL) 5 MG tablet Take 5 mg by mouth every evening.     Multiple Vitamin (MULTI VITAMIN DAILY PO) Take by mouth. Take 2 gummies by mouth daily.     omeprazole (PRILOSEC) 20 MG capsule TAKE 1 CAPSULE TWICE A DAY 60 capsule 1   sertraline (ZOLOFT) 50 MG tablet Take 50 mg by mouth daily.      tiZANidine (ZANAFLEX) 2 MG tablet Take 1-2 tablets (2-4 mg total) by mouth every 6 (six) hours as needed for muscle spasms. 60 tablet 1    No results found for this or any previous visit (from the past 48 hour(s)).  No results found.   A comprehensive review of systems was negative.  Height 5\' 5"  (1.651 m), weight 79.4 kg, last menstrual period 04/30/2021.  General appearance: alert, cooperative, and appears stated age Head: Normocephalic, without obvious abnormality, atraumatic Neck: supple, symmetrical, trachea midline Cardio: regular rate and rhythm Resp: clear to auscultation bilaterally Extremities: Intact sensation and capillary refill all digits.  +epl/fpl/io.  No wounds.  Pulses: 2+ and symmetric Skin: Skin color, texture, turgor normal. No rashes or lesions Neurologic: Grossly normal Incision/Wound: none  Assessment/Plan Right thumb mucoid cyst and ip joint arthritis.  She wishes to proceed with excision of cyst and debridement of ip joint.  Non operative and operative treatment options have been discussed with the patient and patient wishes to proceed with operative treatment. Risks, benefits, and alternatives of surgery have been discussed and the patient agrees with the plan of care.   Leanora Cover 07/04/2021, 8:54 AM

## 2021-07-04 NOTE — Transfer of Care (Signed)
Immediate Anesthesia Transfer of Care Note  Patient: Crystal Mcintyre  Procedure(s) Performed: RIGHT THUMB EXCISION MUCOID CYST AND DEBRIDEMENT INTERPHALANGEAL JOINT ARTHRITIS (Right: Thumb)  Patient Location: PACU  Anesthesia Type:MAC and Bier block  Level of Consciousness: sedated  Airway & Oxygen Therapy: Patient Spontanous Breathing and Patient connected to face mask oxygen  Post-op Assessment: Report given to RN and Post -op Vital signs reviewed and stable  Post vital signs: Reviewed and stable  Last Vitals:  Vitals Value Taken Time  BP 104/69 07/04/21 1139  Temp    Pulse 69 07/04/21 1141  Resp 13 07/04/21 1141  SpO2 100 % 07/04/21 1141  Vitals shown include unvalidated device data.  Last Pain:  Vitals:   07/04/21 0948  TempSrc: Oral  PainSc: 0-No pain      Patients Stated Pain Goal: 6 (18/98/42 1031)  Complications: No notable events documented.

## 2021-07-04 NOTE — Op Note (Signed)
NAME: Crystal Mcintyre RECORD NO: [id number] DATE OF BIRTH: [birth year] FACILITY: [facility] LOCATION: [facility] PHYSICIAN: Tennis Must, MD   OPERATIVE REPORT   DATE OF PROCEDURE: 07/04/21    PREOPERATIVE DIAGNOSIS: Right thumb mucoid cyst and IP joint arthritis   POSTOPERATIVE DIAGNOSIS: Right thumb mucoid cyst and IP joint arthritis   PROCEDURE: 1.  Right thumb excision of mucoid cyst 2.  Right thumb debridement of IP joint   SURGEON:  Leanora Cover, M.D.   ASSISTANT: none   ANESTHESIA:  Bier block with sedation   INTRAVENOUS FLUIDS:  Per anesthesia flow sheet.   ESTIMATED BLOOD LOSS:  Minimal.   COMPLICATIONS:  None.   SPECIMENS: Right thumb mucoid cyst to pathology   TOURNIQUET TIME:    Total Tourniquet Time Documented: Forearm (Right) - 28 minutes Total: Forearm (Right) - 28 minutes    DISPOSITION:  Stable to PACU.   INDICATIONS: 45 year old female is noted a mass on her right thumb for approximately 2 months.  It is bothersome to her.  She wishes to have it removed and the IP joint of the thumb debrided to try to prevent recurrence.  Risks, benefits and alternatives of surgery were discussed including the risks of blood loss, infection, damage to nerves, vessels, tendons, ligaments, bone for surgery, need for additional surgery, complications with wound healing, continued pain, stiffness, recurrence.  She voiced understanding of these risks and elected to proceed.  OPERATIVE COURSE:  After being identified preoperatively by myself,  the patient and I agreed on the procedure and site of the procedure.  The surgical site was marked.  Surgical consent had been signed. She was given IV antibiotics as preoperative antibiotic prophylaxis. She was transferred to the operating room and placed on the operating table in supine position with the Right upper extremity on an arm board.  Bier block anesthesia was induced by the anesthesiologist.  Right upper  extremity was prepped and draped in normal sterile orthopedic fashion.  A surgical pause was performed between the surgeons, anesthesia, and operating room staff and all were in agreement as to the patient, procedure, and site of procedure.  Tourniquet at the proximal aspect of the forearm had been inflated for the Bier block.  A hockey-stick shaped incision was made at the IP joint of the thumb.  This was carried into the subcutaneous tissues by spreading technique.  The cyst was identified.  It was carefully freed up from surrounding tissues.  It was coming from the dorsal ulnar aspect of the IP joint.  The stalk was removed and the cyst sent to pathology for examination.  The IP joint was entered underneath the extensor tendon.  The synovectomy rongeurs were used to debride the synovium and any tissue that appeared to be cyst generating.  I did not see any osteophytes requiring removal.  The wound and IP joint were copiously irrigated with sterile saline.  The wound was closed with 4-0 nylon in a horizontal mattress fashion.  A digital block was performed with quarter percent plain Marcaine to aid in postoperative analgesia.  The wound was dressed with sterile Xeroform 4 x 4 and wrapped with a Coban dressing lightly.  An AlumaFoam splint was placed and wrapped lightly with Coban dressing the tourniquet was deflated at 28 minutes.  Fingertips were pink with brisk capillary refill after deflation of tourniquet.  The operative  drapes were broken down.  The patient was awoken from anesthesia safely.  She was transferred back  to the stretcher and taken to PACU in stable condition.  I will see her back in the office in 1 week for postoperative followup.  I will give her a prescription for Norco 5/325 1-2 tabs PO q6 hours prn pain, dispense # 20.   Leanora Cover, MD Electronically signed, 07/04/21

## 2021-07-04 NOTE — Discharge Instructions (Addendum)

## 2021-07-04 NOTE — Anesthesia Preprocedure Evaluation (Signed)
Anesthesia Evaluation  Patient identified by MRN, date of birth, ID band Patient awake    Reviewed: Allergy & Precautions, NPO status , Patient's Chart, lab work & pertinent test results  Airway Mallampati: II  TM Distance: >3 FB Neck ROM: Full    Dental no notable dental hx. (+) Teeth Intact   Pulmonary former smoker,    Pulmonary exam normal breath sounds clear to auscultation       Cardiovascular negative cardio ROS Normal cardiovascular exam Rhythm:Regular Rate:Normal     Neuro/Psych  Headaches, Anxiety    GI/Hepatic Neg liver ROS, GERD  Medicated and Controlled,  Endo/Other  Hyperthyroidism Hx/o Multinodular goiter  Renal/GU negative Renal ROS  negative genitourinary   Musculoskeletal negative musculoskeletal ROS (+)   Abdominal   Peds  Hematology negative hematology ROS (+)   Anesthesia Other Findings   Reproductive/Obstetrics                             Anesthesia Physical Anesthesia Plan  ASA: 2  Anesthesia Plan: MAC and Bier Block and Bier Block-LIDOCAINE ONLY   Post-op Pain Management:    Induction: Intravenous  PONV Risk Score and Plan: 3 and Treatment may vary due to age or medical condition, Ondansetron, Midazolam and Propofol infusion  Airway Management Planned: Natural Airway and Simple Face Mask  Additional Equipment:   Intra-op Plan:   Post-operative Plan:   Informed Consent: I have reviewed the patients History and Physical, chart, labs and discussed the procedure including the risks, benefits and alternatives for the proposed anesthesia with the patient or authorized representative who has indicated his/her understanding and acceptance.     Dental advisory given  Plan Discussed with: CRNA and Anesthesiologist  Anesthesia Plan Comments:         Anesthesia Quick Evaluation

## 2021-07-04 NOTE — Anesthesia Procedure Notes (Signed)
Anesthesia Regional Block: Bier block (IV Regional)   Pre-Anesthetic Checklist: , timeout performed,  Correct Patient, Correct Site, Correct Laterality,  Correct Procedure,, site marked,  Surgical consent,  At surgeon's request  Laterality: Right         Needles:  Injection technique: Single-shot  Needle Type: Other      Needle Gauge: 20     Additional Needles:   Procedures:,,,,, intact distal pulses, Esmarch exsanguination,  Single tourniquet utilized    Narrative:  Start time: 07/04/2021 11:03 AM End time: 07/04/2021 11:03 AM  Performed by: Personally

## 2021-07-04 NOTE — Anesthesia Postprocedure Evaluation (Signed)
Anesthesia Post Note  Patient: Crystal Mcintyre  Procedure(s) Performed: RIGHT THUMB EXCISION MUCOID CYST AND DEBRIDEMENT INTERPHALANGEAL JOINT ARTHRITIS (Right: Thumb)     Patient location during evaluation: PACU Anesthesia Type: MAC Level of consciousness: awake and alert and oriented Pain management: pain level controlled Vital Signs Assessment: post-procedure vital signs reviewed and stable Respiratory status: spontaneous breathing, nonlabored ventilation and respiratory function stable Cardiovascular status: stable and blood pressure returned to baseline Postop Assessment: no apparent nausea or vomiting Anesthetic complications: no   No notable events documented.  Last Vitals:  Vitals:   07/04/21 1145 07/04/21 1156  BP: 112/77 126/83  Pulse: 60 77  Resp: 14 19  Temp:    SpO2: 100% 100%    Last Pain:  Vitals:   07/04/21 1156  TempSrc:   PainSc: 0-No pain                 Trampas Stettner A.

## 2021-07-05 ENCOUNTER — Encounter (HOSPITAL_BASED_OUTPATIENT_CLINIC_OR_DEPARTMENT_OTHER): Payer: Self-pay | Admitting: Orthopedic Surgery

## 2021-07-05 ENCOUNTER — Other Ambulatory Visit: Payer: Self-pay | Admitting: Obstetrics and Gynecology

## 2021-07-05 DIAGNOSIS — D259 Leiomyoma of uterus, unspecified: Secondary | ICD-10-CM

## 2021-07-05 LAB — SURGICAL PATHOLOGY

## 2021-07-24 ENCOUNTER — Ambulatory Visit
Admission: RE | Admit: 2021-07-24 | Discharge: 2021-07-24 | Disposition: A | Discharge status: Home / Self Care | Payer: Commercial Managed Care - PPO | Source: Ambulatory Visit | Attending: Obstetrics and Gynecology | Admitting: Obstetrics and Gynecology

## 2021-07-24 ENCOUNTER — Encounter: Payer: Self-pay | Admitting: *Deleted

## 2021-07-24 ENCOUNTER — Other Ambulatory Visit: Payer: Self-pay | Admitting: Interventional Radiology

## 2021-07-24 DIAGNOSIS — D259 Leiomyoma of uterus, unspecified: Secondary | ICD-10-CM

## 2021-07-24 HISTORY — PX: IR RADIOLOGIST EVAL & MGMT: IMG5224

## 2021-07-24 NOTE — Consult Note (Signed)
Chief Complaint:  Dysfunctional uterine bleeding, fibroids  Referring Physician(s): Cousins,Sheronette  History of Present Illness: Crystal Mcintyre is a 45 y.o. female with known uterine fibroids, followed closely by her OB/GYN.  She is a G0, P0 A0.  No future pregnancy plans.  Review of her menstrual cycle was performed.  Last menstrual period 07/04/2021.  Frequency is every month.  Periods can last up to 20 days with 15 heavy days and frequent hourly pad change.  Bleeding symptoms also include passage of fresh blood clots.  Bulk related symptoms include urinary frequency and urgency.  She also has iron deficiency anemia requiring iron supplementation.  She currently is on any birth control pills or other hormone replacement therapies.  No prior fibroid surgery or recent GYN infections.  Pap smear 04/25/2021 was negative.  Ultrasound performed at an outside office 05/30/2021 demonstrated multiple fibroids ranging in size from 5 to 8 cm and overall uterine size 18 weeks.  Past Medical History:  Diagnosis Date   GOITER, MULTINODULAR 04/18/2009   Headache(784.0) 06/17/2007   IRREGULAR MENSTRUATION 06/17/2007   Other anxiety states 01/30/2010   THYROID STIMULATING HORMONE, ABNORMAL 01/10/2009   TOBACCO USE 01/10/2008   TRANSAMINASES, SERUM, ELEVATED 01/10/2009    Past Surgical History:  Procedure Laterality Date   MASS EXCISION Right 07/04/2021   Procedure: RIGHT THUMB EXCISION MUCOID CYST AND DEBRIDEMENT INTERPHALANGEAL JOINT ARTHRITIS;  Surgeon: Leanora Cover, MD;  Location: [facility];  Service: Orthopedics;  Laterality: Right;   TONSILLECTOMY  as child   TYMPANOPLASTY  4 yrs ago   TYMPANOSTOMY TUBE PLACEMENT  as child   UPPER GASTROINTESTINAL ENDOSCOPY      Allergies: Patient has no known allergies.  Medications: Prior to Admission medications   Medication Sig Start Date End Date Taking? Authorizing Provider  Ferrous Sulfate (IRON PO) Take 1 tablet by mouth daily.     [provider]  gabapentin (NEURONTIN) 100 MG capsule Take 1-3 capsules (100-300 mg total) by mouth at bedtime. 03/19/21   Panosh, [name], MD  HYDROcodone-acetaminophen [facility]) 5-325 MG tablet 1-2 tabs po q6 hours prn pain 07/04/21   Leanora Cover, MD  levocetirizine (XYZAL) 5 MG tablet Take 5 mg by mouth every evening.    [provider]  Multiple Vitamin (MULTI VITAMIN DAILY PO) Take by mouth. Take 2 gummies by mouth daily.    [provider]  omeprazole (PRILOSEC) 20 MG capsule TAKE 1 CAPSULE TWICE A DAY 10/07/13   Pyrtle, Lajuan Lines, MD  sertraline (ZOLOFT) 50 MG tablet Take 50 mg by mouth daily.    [provider]  tiZANidine (ZANAFLEX) 2 MG tablet Take 1-2 tablets (2-4 mg total) by mouth every 6 (six) hours as needed for muscle spasms. 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      Social Determinants of Health   Financial Resource Strain: Not on file  Food Insecurity: Not on file  Transportation Needs: Not on file  Physical Activity: Not on file  Stress: Not on file  Social Connections: Not on file    Review of Systems: A 12 point ROS discussed and pertinent positives are indicated in the HPI above.  All other systems are negative.  Review of Systems  Vital Signs: BP 131/74 (BP Location: Right Arm)   Pulse 75   SpO2 99%   Physical Exam Constitutional:      General: She is not in acute distress.    Appearance: She is normal weight. She is not diaphoretic.  Eyes:     General: No scleral icterus.    Conjunctiva/sclera: Conjunctivae normal.  Cardiovascular:     Rate and Rhythm: Normal rate and regular rhythm.     Pulses: Normal pulses.     Heart sounds: No murmur heard. Pulmonary:     Effort: Pulmonary effort is normal.     Breath sounds: Normal breath sounds.  Abdominal:     General: Bowel sounds are normal. There is no distension.     Palpations: Abdomen is soft.     Tenderness: There is no abdominal tenderness.     Comments: Lobulated firm nontender lower abdominal pelvic mass compatible with enlarged fibroid uterus measuring approximately 18 weeks.  Musculoskeletal:        General: No swelling.  Skin:    General: Skin is warm and dry.     Coloration: Skin is not jaundiced.  Neurological:     General: No focal deficit present.  Psychiatric:        Mood and Affect: Mood normal.     Imaging: No results found.  Labs:  CBC: Recent Labs    08/02/20 0955 09/11/20 0956  WBC 4.1 3.7*  HGB 9.7* 11.3*  HCT 32.5* 37.0  PLT 330 286    COAGS: No results for input(s): INR, APTT in the last 8760 hours.  BMP: Recent Labs    08/02/20 0955  NA 140  K 4.4  CL 106  CO2 27  GLUCOSE 77  BUN 11  CALCIUM 9.6  CREATININE 0.81  GFRNONAA 88  GFRAA 102    LIVER FUNCTION TESTS: Recent Labs     08/02/20 0955  BILITOT 0.2  AST 16  ALT 9  PROT 6.9      Assessment and Plan:  45 year old female with symptomatic uterine fibroids, main symptom is abnormal uterine bleeding.  She also has bulk related symptoms with urinary frequency and urgency.  She has an associated iron deficiency anemia requiring iron supplementation.  Treatment options for uterine fibroids were reviewed in detail including the  uterine fibroid embolization procedure.  The embolization procedure, risk, benefits and alternatives were all reviewed.  The procedure was described in detail including observation overnight recovery at [facility].  All questions were addressed.  She has a clear understanding of the procedure.  After discussion, she would like to proceed with the work-up which would include pelvic MRI to assess fibroid anatomy for embolization.  Plan: Scheduled for pelvic MRI without and with contrast as an outpatient in the next few days to assess fibroid anatomy for embolization.  Thank you for this interesting consult.  I greatly enjoyed meeting TIONNA STEENSMA and look forward to participating in their care.  A copy of this report was sent to the requesting provider on this date.  Electronically Signed: Greggory Keen 07/24/2021, 11:31 AM   I spent a total of  60 Minutes   in face to face in clinical consultation, greater than 50% of which was counseling/coordinating care for this patient with symptomatic uterine fibroid

## 2021-08-02 ENCOUNTER — Other Ambulatory Visit: Payer: Self-pay | Admitting: Family Medicine

## 2021-08-03 ENCOUNTER — Ambulatory Visit (HOSPITAL_COMMUNITY)
Admission: RE | Admit: 2021-08-03 | Discharge: 2021-08-03 | Disposition: A | Discharge status: Home / Self Care | Payer: Commercial Managed Care - PPO | Source: Ambulatory Visit | Attending: Interventional Radiology | Admitting: Interventional Radiology

## 2021-08-03 DIAGNOSIS — D259 Leiomyoma of uterus, unspecified: Secondary | ICD-10-CM | POA: Diagnosis present

## 2021-08-03 MED ORDER — GADOBUTROL 1 MMOL/ML IV SOLN
7.5000 mL | Freq: Once | INTRAVENOUS | Status: AC | PRN
  Administered 2021-08-03: 7.5 mL via INTRAVENOUS

## 2021-08-18 ENCOUNTER — Other Ambulatory Visit: Payer: Self-pay | Admitting: Family Medicine

## 2021-09-11 ENCOUNTER — Other Ambulatory Visit (HOSPITAL_COMMUNITY): Payer: Self-pay | Admitting: Interventional Radiology

## 2021-09-11 DIAGNOSIS — D259 Leiomyoma of uterus, unspecified: Secondary | ICD-10-CM

## 2021-10-22 ENCOUNTER — Telehealth: Payer: Self-pay | Admitting: Internal Medicine

## 2021-10-22 NOTE — Telephone Encounter (Signed)
Patient brought in paperwork that she needs Dr.Panosh to complete. Paperwork will be placed in the folder.  Patient could be contacted at [phone] when completed.   Please advise.

## 2021-10-23 ENCOUNTER — Other Ambulatory Visit: Payer: Self-pay

## 2021-10-24 LAB — SARS CORONAVIRUS 2 (TAT 6-24 HRS): SARS Coronavirus 2: NEGATIVE

## 2021-10-25 ENCOUNTER — Other Ambulatory Visit: Payer: Self-pay | Admitting: Radiology

## 2021-10-25 ENCOUNTER — Other Ambulatory Visit: Payer: Self-pay | Admitting: Internal Medicine

## 2021-10-28 ENCOUNTER — Observation Stay (HOSPITAL_COMMUNITY)
Admission: RE | Admit: 2021-10-28 | Discharge: 2021-10-29 | Disposition: A | Discharge status: Home / Self Care | Payer: Commercial Managed Care - PPO | Source: Ambulatory Visit | Attending: Interventional Radiology | Admitting: Interventional Radiology

## 2021-10-28 ENCOUNTER — Encounter (HOSPITAL_COMMUNITY): Payer: Self-pay

## 2021-10-28 ENCOUNTER — Other Ambulatory Visit: Payer: Self-pay

## 2021-10-28 ENCOUNTER — Ambulatory Visit (HOSPITAL_COMMUNITY)
Admission: RE | Admit: 2021-10-28 | Discharge: 2021-10-28 | Disposition: A | Discharge status: Home / Self Care | Payer: Commercial Managed Care - PPO | Source: Ambulatory Visit | Attending: Interventional Radiology | Admitting: Interventional Radiology

## 2021-10-28 DIAGNOSIS — D219 Benign neoplasm of connective and other soft tissue, unspecified: Secondary | ICD-10-CM | POA: Diagnosis present

## 2021-10-28 DIAGNOSIS — N920 Excessive and frequent menstruation with regular cycle: Secondary | ICD-10-CM | POA: Insufficient documentation

## 2021-10-28 DIAGNOSIS — D259 Leiomyoma of uterus, unspecified: Principal | ICD-10-CM | POA: Insufficient documentation

## 2021-10-28 DIAGNOSIS — R3915 Urgency of urination: Secondary | ICD-10-CM | POA: Diagnosis not present

## 2021-10-28 DIAGNOSIS — Z23 Encounter for immunization: Secondary | ICD-10-CM | POA: Insufficient documentation

## 2021-10-28 DIAGNOSIS — Z79899 Other long term (current) drug therapy: Secondary | ICD-10-CM | POA: Insufficient documentation

## 2021-10-28 DIAGNOSIS — Z0279 Encounter for issue of other medical certificate: Secondary | ICD-10-CM

## 2021-10-28 HISTORY — PX: IR ANGIOGRAM SELECTIVE EACH ADDITIONAL VESSEL: IMG667

## 2021-10-28 HISTORY — PX: IR US GUIDE VASC ACCESS LEFT: IMG2389

## 2021-10-28 HISTORY — PX: IR EMBO TUMOR ORGAN ISCHEMIA INFARCT INC GUIDE ROADMAPPING: IMG5449

## 2021-10-28 HISTORY — PX: IR ANGIOGRAM PELVIS SELECTIVE OR SUPRASELECTIVE: IMG661

## 2021-10-28 HISTORY — PX: IR US GUIDE VASC ACCESS RIGHT: IMG2390

## 2021-10-28 LAB — CBC WITH DIFFERENTIAL/PLATELET
Abs Immature Granulocytes: 0 10*3/uL (ref 0.00–0.07)
Basophils Absolute: 0 10*3/uL (ref 0.0–0.1)
Basophils Relative: 1 %
Eosinophils Absolute: 0.1 10*3/uL (ref 0.0–0.5)
Eosinophils Relative: 2 %
HCT: 35.5 % — ABNORMAL LOW (ref 36.0–46.0)
Hemoglobin: 10.4 g/dL — ABNORMAL LOW (ref 12.0–15.0)
Immature Granulocytes: 0 %
Lymphocytes Relative: 45 %
Lymphs Abs: 2.3 10*3/uL (ref 0.7–4.0)
MCH: 23.1 pg — ABNORMAL LOW (ref 26.0–34.0)
MCHC: 29.3 g/dL — ABNORMAL LOW (ref 30.0–36.0)
MCV: 78.9 fL — ABNORMAL LOW (ref 80.0–100.0)
Monocytes Absolute: 0.4 10*3/uL (ref 0.1–1.0)
Monocytes Relative: 9 %
Neutro Abs: 2.1 10*3/uL (ref 1.7–7.7)
Neutrophils Relative %: 43 %
Platelets: 333 10*3/uL (ref 150–400)
RBC: 4.5 MIL/uL (ref 3.87–5.11)
RDW: 15.1 % (ref 11.5–15.5)
WBC: 4.9 10*3/uL (ref 4.0–10.5)
nRBC: 0 % (ref 0.0–0.2)

## 2021-10-28 LAB — BASIC METABOLIC PANEL
Anion gap: 6 (ref 5–15)
BUN: 15 mg/dL (ref 6–20)
CO2: 25 mmol/L (ref 22–32)
Calcium: 9.2 mg/dL (ref 8.9–10.3)
Chloride: 105 mmol/L (ref 98–111)
Creatinine, Ser: 0.51 mg/dL (ref 0.44–1.00)
GFR, Estimated: >60 mL/min (ref >60)
Glucose, Bld: 90 mg/dL (ref 70–99)
Potassium: 3.6 mmol/L (ref 3.5–5.1)
Sodium: 136 mmol/L (ref 135–145)

## 2021-10-28 LAB — PROTIME-INR
INR: 0.9 (ref 0.8–1.2)
Prothrombin Time: 12 seconds (ref 11.4–15.2)

## 2021-10-28 LAB — HCG, SERUM, QUALITATIVE: Preg, Serum: NEGATIVE

## 2021-10-28 MED ORDER — SODIUM CHLORIDE 0.9% FLUSH: 3.0000 mL | Freq: Two times a day (BID) | INTRAVENOUS | Status: DC

## 2021-10-28 MED ORDER — KETOROLAC TROMETHAMINE 30 MG/ML IJ SOLN
30.0000 mg | Freq: Four times a day (QID) | INTRAMUSCULAR | Status: DC
  Administered 2021-10-28 – 2021-10-29 (×3): 30 mg via INTRAVENOUS
  Filled 2021-10-28 (×3): qty 1

## 2021-10-28 MED ORDER — SODIUM CHLORIDE 0.9 % IV SOLN: INTRAVENOUS | Status: DC

## 2021-10-28 MED ORDER — KETOROLAC TROMETHAMINE 30 MG/ML IJ SOLN
INTRAMUSCULAR | Status: AC
  Administered 2021-10-28: 30 mg via INTRAVENOUS
  Filled 2021-10-28: qty 1

## 2021-10-28 MED ORDER — ONDANSETRON HCL 4 MG/2ML IJ SOLN: 4.0000 mg | Freq: Four times a day (QID) | INTRAMUSCULAR | Status: DC | PRN

## 2021-10-28 MED ORDER — IOHEXOL 300 MG/ML  SOLN
100.0000 mL | Freq: Once | INTRAMUSCULAR | Status: AC | PRN
  Administered 2021-10-28: 90 mL via INTRA_ARTERIAL

## 2021-10-28 MED ORDER — LORATADINE 10 MG PO TABS
10.0000 mg | ORAL_TABLET | Freq: Every evening | ORAL | Status: DC
  Administered 2021-10-28: 10 mg via ORAL
  Filled 2021-10-28: qty 1

## 2021-10-28 MED ORDER — LIDOCAINE HCL 1 % IJ SOLN
20.0000 mL | Freq: Once | INTRAMUSCULAR | Status: DC
  Filled 2021-10-28: qty 20

## 2021-10-28 MED ORDER — PANTOPRAZOLE SODIUM 40 MG PO TBEC
40.0000 mg | DELAYED_RELEASE_TABLET | Freq: Every day | ORAL | Status: DC
  Administered 2021-10-28 – 2021-10-29 (×2): 40 mg via ORAL
  Filled 2021-10-28 (×2): qty 1

## 2021-10-28 MED ORDER — MIDAZOLAM HCL 2 MG/2ML IJ SOLN
INTRAMUSCULAR | Status: AC | PRN
  Administered 2021-10-28 (×2): 1 mg via INTRAVENOUS

## 2021-10-28 MED ORDER — IOHEXOL 300 MG/ML  SOLN
100.0000 mL | Freq: Once | INTRAMUSCULAR | Status: AC | PRN
  Administered 2021-10-28: 40 mL via INTRA_ARTERIAL

## 2021-10-28 MED ORDER — HYDROMORPHONE 1 MG/ML IV SOLN
INTRAVENOUS | Status: DC
  Administered 2021-10-28: 1.2 mg via INTRAVENOUS
  Administered 2021-10-28: 30 mg via INTRAVENOUS
  Administered 2021-10-28: 3.9 mg via INTRAVENOUS
  Administered 2021-10-29: 2.1 mg via INTRAVENOUS
  Filled 2021-10-28 (×11): qty 30

## 2021-10-28 MED ORDER — SODIUM CHLORIDE 0.9% FLUSH: 3.0000 mL | INTRAVENOUS | Status: DC | PRN

## 2021-10-28 MED ORDER — SERTRALINE HCL 50 MG PO TABS
50.0000 mg | ORAL_TABLET | Freq: Every day | ORAL | Status: DC
  Administered 2021-10-28 – 2021-10-29 (×2): 50 mg via ORAL
  Filled 2021-10-28 (×2): qty 1

## 2021-10-28 MED ORDER — HYDROMORPHONE HCL 1 MG/ML IJ SOLN
INTRAMUSCULAR | Status: AC
  Filled 2021-10-28: qty 1

## 2021-10-28 MED ORDER — DIPHENHYDRAMINE HCL 12.5 MG/5ML PO ELIX
12.5000 mg | ORAL_SOLUTION | Freq: Four times a day (QID) | ORAL | Status: DC | PRN
  Filled 2021-10-28: qty 5

## 2021-10-28 MED ORDER — CEFAZOLIN SODIUM-DEXTROSE 2-4 GM/100ML-% IV SOLN
INTRAVENOUS | Status: AC
  Administered 2021-10-28: 2 g via INTRAVENOUS
  Filled 2021-10-28: qty 100

## 2021-10-28 MED ORDER — FENTANYL CITRATE (PF) 100 MCG/2ML IJ SOLN
INTRAMUSCULAR | Status: AC
  Filled 2021-10-28: qty 2

## 2021-10-28 MED ORDER — INFLUENZA VAC SPLIT QUAD 0.5 ML IM SUSY
0.5000 mL | PREFILLED_SYRINGE | INTRAMUSCULAR | Status: AC
  Administered 2021-10-29: 0.5 mL via INTRAMUSCULAR
  Filled 2021-10-28: qty 0.5

## 2021-10-28 MED ORDER — DOCUSATE SODIUM 100 MG PO CAPS
100.0000 mg | ORAL_CAPSULE | Freq: Two times a day (BID) | ORAL | Status: DC
  Administered 2021-10-28 – 2021-10-29 (×2): 100 mg via ORAL
  Filled 2021-10-28 (×2): qty 1

## 2021-10-28 MED ORDER — CEFAZOLIN SODIUM-DEXTROSE 2-4 GM/100ML-% IV SOLN: 2.0000 g | INTRAVENOUS | Status: AC

## 2021-10-28 MED ORDER — HYDROMORPHONE HCL 1 MG/ML IJ SOLN
INTRAMUSCULAR | Status: AC | PRN
  Administered 2021-10-28: 1 mg via INTRAVENOUS

## 2021-10-28 MED ORDER — MIDAZOLAM HCL 2 MG/2ML IJ SOLN
INTRAMUSCULAR | Status: AC
  Filled 2021-10-28: qty 4

## 2021-10-28 MED ORDER — SODIUM CHLORIDE 0.9% FLUSH: 9.0000 mL | INTRAVENOUS | Status: DC | PRN

## 2021-10-28 MED ORDER — PROMETHAZINE HCL 25 MG PO TABS
25.0000 mg | ORAL_TABLET | Freq: Three times a day (TID) | ORAL | Status: DC | PRN
  Administered 2021-10-28: 25 mg via ORAL
  Filled 2021-10-28: qty 1

## 2021-10-28 MED ORDER — DIPHENHYDRAMINE HCL 50 MG/ML IJ SOLN: 12.5000 mg | Freq: Four times a day (QID) | INTRAMUSCULAR | Status: DC | PRN

## 2021-10-28 MED ORDER — LIDOCAINE HCL (PF) 1 % IJ SOLN
INTRAMUSCULAR | Status: AC | PRN
  Administered 2021-10-28: 5 mL

## 2021-10-28 MED ORDER — SODIUM CHLORIDE 0.9 % IV SOLN: 250.0000 mL | INTRAVENOUS | Status: DC | PRN

## 2021-10-28 MED ORDER — FENTANYL CITRATE (PF) 100 MCG/2ML IJ SOLN
INTRAMUSCULAR | Status: AC | PRN
  Administered 2021-10-28 (×2): 50 ug via INTRAVENOUS

## 2021-10-28 MED ORDER — PROMETHAZINE HCL 25 MG RE SUPP
25.0000 mg | Freq: Three times a day (TID) | RECTAL | Status: DC | PRN
  Filled 2021-10-28: qty 1

## 2021-10-28 MED ORDER — NALOXONE HCL 0.4 MG/ML IJ SOLN: 0.4000 mg | INTRAMUSCULAR | Status: DC | PRN

## 2021-10-28 MED ORDER — ONDANSETRON HCL 4 MG/2ML IJ SOLN
4.0000 mg | Freq: Four times a day (QID) | INTRAMUSCULAR | Status: DC | PRN
  Administered 2021-10-28: 4 mg via INTRAVENOUS
  Filled 2021-10-28: qty 2

## 2021-10-28 MED ORDER — GABAPENTIN 100 MG PO CAPS
100.0000 mg | ORAL_CAPSULE | Freq: Every day | ORAL | Status: DC
  Administered 2021-10-28: 200 mg via ORAL
  Filled 2021-10-28: qty 2

## 2021-10-28 MED ORDER — DEXAMETHASONE 4 MG PO TABS
8.0000 mg | ORAL_TABLET | ORAL | Status: AC
  Administered 2021-10-28: 8 mg via ORAL
  Filled 2021-10-28: qty 2

## 2021-10-28 MED ORDER — KETOROLAC TROMETHAMINE 30 MG/ML IJ SOLN
30.0000 mg | Freq: Once | INTRAMUSCULAR | Status: AC
Start: 2021-10-28 — End: 2021-10-28

## 2021-10-28 MED ORDER — LIDOCAINE HCL 1 % IJ SOLN
INTRAMUSCULAR | Status: AC
  Filled 2021-10-28: qty 20

## 2021-10-28 NOTE — H&P (Signed)
Referring Physician(s): Cousins,S  Supervising Physician: Daryll Brod  Patient Status:  [facility] OP TBA  Chief Complaint:  Symptomatic uterine fibroids  Subjective: Patient familiar to IR service from consultation with Dr. Annamaria Boots on 07/24/2021 to discuss treatment options for symptomatic uterine fibroids.  She was deemed an appropriate candidate for bilateral uterine artery embolization and presents today for the procedure.  She currently denies fever, headache, chest pain, dyspnea, cough, nausea, vomiting.  She does have intermittent abdominal and back discomfort, menorrhagia and urinary urgency.  She denies hematuria or dysuria.  Past Medical History:  Diagnosis Date   GOITER, MULTINODULAR 04/18/2009   Headache(784.0) 06/17/2007   IRREGULAR MENSTRUATION 06/17/2007   Other anxiety states 01/30/2010   THYROID STIMULATING HORMONE, ABNORMAL 01/10/2009   TOBACCO USE 01/10/2008   TRANSAMINASES, SERUM, ELEVATED 01/10/2009   Past Surgical History:  Procedure Laterality Date   IR RADIOLOGIST EVAL & MGMT  07/24/2021   MASS EXCISION Right 07/04/2021   Procedure: RIGHT THUMB EXCISION MUCOID CYST AND DEBRIDEMENT INTERPHALANGEAL JOINT ARTHRITIS;  Surgeon: Leanora Cover, MD;  Location: [facility];  Service: Orthopedics;  Laterality: Right;   TONSILLECTOMY  as child   TYMPANOPLASTY  4 yrs ago   TYMPANOSTOMY TUBE PLACEMENT  as child   UPPER GASTROINTESTINAL ENDOSCOPY        Allergies: Patient has no known allergies.  Medications: Prior to Admission medications   Medication Sig Start Date End Date Taking? Authorizing Provider  Ferrous Sulfate (IRON PO) Take 1 tablet by mouth daily.   Yes [provider]  gabapentin (NEURONTIN) 100 MG capsule Take 1-3 capsules (100-300 mg total) by mouth at bedtime. 03/19/21  Yes Panosh, [name], MD  Multiple Vitamin (MULTI VITAMIN DAILY PO) Take by mouth. Take 2 gummies by mouth daily.   Yes [provider]  omeprazole (PRILOSEC) 20  MG capsule TAKE 1 CAPSULE TWICE A DAY 10/07/13  Yes Pyrtle, Lajuan Lines, MD  sertraline (ZOLOFT) 50 MG tablet Take 50 mg by mouth daily.   Yes [provider]  HYDROcodone-acetaminophen (NORCO) 5-325 MG tablet 1-2 tabs po q6 hours prn pain 07/04/21   Leanora Cover, MD  levocetirizine (XYZAL) 5 MG tablet Take 5 mg by mouth every evening.    [provider]  tiZANidine (ZANAFLEX) 2 MG tablet TAKE 1 TO 2 TABLETS BY MOUTH EVERY 6 HOURS AS NEEDED FOR MUSCLE SPASMS 08/19/21   Hilts, Legrand Como, MD     Vital Signs: Ht 5\' 5"  (1.651 m)   Wt 175 lb (79.4 kg)   LMP 10/14/2021 Comment: still having some spotting.serum preg.pending,10/28/21  BMI 29.12 kg/m   Physical Exam awake, alert.  Chest clear to auscultation bilaterally.  Heart with regular rate and rhythm.  Abdomen soft, positive bowel sounds, fibroid uterus, mildly tender anterior pelvic region to palpation.  No lower extremity edema, intact distal pulses.  Imaging: No results found.  Labs:  CBC: Recent Labs    10/28/21 0740  WBC 4.9  HGB 10.4*  HCT 35.5*  PLT 333    COAGS: Recent Labs    10/28/21 0740  INR 0.9    BMP: Recent Labs    10/28/21 0740  NA 136  K 3.6  CL 105  CO2 25  GLUCOSE 90  BUN 15  CALCIUM 9.2  CREATININE 0.51  GFRNONAA >60    LIVER FUNCTION TESTS: No results for input(s): BILITOT, AST, ALT, ALKPHOS, PROT, ALBUMIN in the last 8760 hours.  Assessment and Plan: Patient familiar to IR service from  consultation with Dr. Annamaria Boots on 07/24/2021 to discuss treatment options for symptomatic uterine fibroids.  She was deemed an appropriate candidate for bilateral uterine artery embolization and presents today for the procedure.Risks and benefits of procedure were discussed with the patient including, but not limited to bleeding, infection, vascular injury or contrast induced renal failure.  This interventional procedure involves the use of X-rays and because of the nature of the planned procedure, it is  possible that we will have prolonged use of X-ray fluoroscopy.  Potential radiation risks to you include (but are not limited to) the following: - A slightly elevated risk for cancer  several years later in life. This risk is typically less than 0.5% percent. This risk is low in comparison to the normal incidence of human cancer, which is 33% for women and 50% for men according to the [place]. - Radiation induced injury can include skin redness, resembling a rash, tissue breakdown / ulcers and hair loss (which can be temporary or permanent).   The likelihood of either of these occurring depends on the difficulty of the procedure and whether you are sensitive to radiation due to previous procedures, disease, or genetic conditions.   IF your procedure requires a prolonged use of radiation, you will be notified and given written instructions for further action.  It is your responsibility to monitor the irradiated area for the 2 weeks following the procedure and to notify your physician if you are concerned that you have suffered a radiation induced injury.    All of the patient's questions were answered, patient is agreeable to proceed.  Consent signed and in chart.      Electronically Signed: D. Rowe Robert, PA-C 10/28/2021, 9:01 AM   I spent a total of 30 minutes at the the patient's bedside AND on the patient's hospital floor or unit, greater than 50% of which was counseling/coordinating care for bilateral uterine artery embolization

## 2021-10-28 NOTE — Progress Notes (Signed)
Patient ID: Crystal Mcintyre, female   DOB: 1976/05/24, 45 y.o.   MRN: [id number] Patient status post bilateral uterine artery embolization earlier today; vital signs stable, afebrile; main complaint at this time is expected pelvic cramping.  Patient denies fever, respiratory issues, nausea, vomiting.  Puncture sites right and left common femoral arteries soft, clean, dry, nontender, no hematoma, intact distal pulses; plan for overnight observation for pain control, patient currently on PCA Dilaudid; DC Foley catheter at 1600

## 2021-10-28 NOTE — Telephone Encounter (Signed)
Trying to sign off note

## 2021-10-28 NOTE — Procedures (Signed)
Interventional Radiology Procedure Note  Procedure: UFE    Complications: None  Estimated Blood Loss:  MIN  Findings: FULL REPORT IN PACS     M. TREVOR Lillianne Eick, MD    

## 2021-10-28 NOTE — Telephone Encounter (Signed)
ATC pt  but pt has been admitted to ED. Forms have been completed by PCP and placed in front office for pickup.

## 2021-10-28 NOTE — Telephone Encounter (Signed)
ATC but could not leave message due to mailbox being full.

## 2021-10-28 NOTE — Progress Notes (Signed)
Pt nauseated this afternoon, approx 50 cc emesis, will defer ambulation until this episode passes, Prn med given.

## 2021-10-29 DIAGNOSIS — D259 Leiomyoma of uterus, unspecified: Secondary | ICD-10-CM | POA: Diagnosis not present

## 2021-10-29 MED ORDER — DOCUSATE SODIUM 100 MG PO CAPS: 100.0000 mg | ORAL_CAPSULE | Freq: Two times a day (BID) | ORAL | 0 refills | Status: DC

## 2021-10-29 MED ORDER — HYDROCODONE-ACETAMINOPHEN 5-325 MG PO TABS: 1.0000 | ORAL_TABLET | Freq: Four times a day (QID) | ORAL | 0 refills | Status: DC | PRN

## 2021-10-29 MED ORDER — HYDROCODONE-ACETAMINOPHEN 5-325 MG PO TABS
1.0000 | ORAL_TABLET | Freq: Four times a day (QID) | ORAL | Status: DC | PRN
  Administered 2021-10-29: 1 via ORAL
  Filled 2021-10-29: qty 1

## 2021-10-29 MED ORDER — PROMETHAZINE HCL 25 MG PO TABS: 25.0000 mg | ORAL_TABLET | Freq: Three times a day (TID) | ORAL | 0 refills | Status: DC | PRN

## 2021-10-29 NOTE — Discharge Instructions (Signed)
May resume home medications; stay well-hydrated; avoid heavy lifting for the next 3 to 4 days; please take ibuprofen 200 mg, 3 tabs every 6 hours for the next 5 days; may use Vicodin for breakthrough pain (pain unrelieved with ibuprofen); take pain medication with food

## 2021-10-29 NOTE — Discharge Summary (Signed)
Patient ID: Crystal Mcintyre MRN: [id number] DOB/AGE: 46-06-1976 46 y.o.  Admit date: 10/28/2021 Discharge date: 10/29/2021  Supervising Physician: Daryll Brod  Patient Status: [facility] - In-pt  Admission Diagnoses: Symptomatic uterine fibroids  Discharge Diagnoses: Symptomatic uterine fibroids, status post bilateral uterine artery embolization on 10/28/2021 Active Problems:   Fibroids Past Medical History:  Diagnosis Date   GOITER, MULTINODULAR 04/18/2009   Headache(784.0) 06/17/2007   IRREGULAR MENSTRUATION 06/17/2007   Other anxiety states 01/30/2010   THYROID STIMULATING HORMONE, ABNORMAL 01/10/2009   TOBACCO USE 01/10/2008   TRANSAMINASES, SERUM, ELEVATED 01/10/2009   Past Surgical History:  Procedure Laterality Date   IR ANGIOGRAM PELVIS SELECTIVE OR SUPRASELECTIVE  10/28/2021   IR ANGIOGRAM PELVIS SELECTIVE OR SUPRASELECTIVE  10/28/2021   IR ANGIOGRAM SELECTIVE EACH ADDITIONAL VESSEL  10/28/2021   IR ANGIOGRAM SELECTIVE EACH ADDITIONAL VESSEL  10/28/2021   IR EMBO TUMOR ORGAN ISCHEMIA INFARCT INC GUIDE ROADMAPPING  10/28/2021   IR RADIOLOGIST EVAL & MGMT  07/24/2021   IR US GUIDE VASC ACCESS LEFT  10/28/2021   IR US GUIDE VASC ACCESS RIGHT  10/28/2021   MASS EXCISION Right 07/04/2021   Procedure: RIGHT THUMB EXCISION MUCOID CYST AND DEBRIDEMENT INTERPHALANGEAL JOINT ARTHRITIS;  Surgeon: Leanora Cover, MD;  Location: [facility];  Service: Orthopedics;  Laterality: Right;   TONSILLECTOMY  as child   TYMPANOPLASTY  4 yrs ago   TYMPANOSTOMY TUBE PLACEMENT  as child   UPPER GASTROINTESTINAL ENDOSCOPY       Discharged Condition: good  Hospital Course: Crystal Mcintyre  is a 45 year old female with history of symptomatic uterine fibroids who underwent consultation with Dr. Annamaria Boots on 07/24/2021 to discuss treatment options.  She was deemed an appropriate candidate for bilateral uterine artery embolization and underwent the procedure at [facility] on 10/28/2021.   The procedure was performed without immediate complications.  Post procedure she was placed on Dilaudid PCA pump and admitted for overnight observation.  Overnight the patient did fairly well with the exception of expected pelvic cramping and occasional nausea.  On the morning of discharge she was stable.  She was able to void, ambulate and tolerate small portions of food without major difficulty.  She was deemed stable for discharge at this time.  Electronic prescriptions were sent in for Norco, Colace and Phenergan.  She will resume her home medications.  She will follow-up with Dr. Annamaria Boots in the IR clinic in 3 to 4 weeks.  She will continue gynecologic care with Dr. Garwin Brothers.  She was told to contact our service in the interim with any additional questions.  Consults: none  Significant Diagnostic Studies:  Results for orders placed or performed during the hospital encounter of 10/28/21  CBC with Differential/Platelet  Result Value Ref Range   WBC 4.9 4.0 - 10.5 K/uL   RBC 4.50 3.87 - 5.11 MIL/uL   Hemoglobin 10.4 (L) 12.0 - 15.0 g/dL   HCT 35.5 (L) 36.0 - 46.0 %   MCV 78.9 (L) 80.0 - 100.0 fL   MCH 23.1 (L) 26.0 - 34.0 pg   MCHC 29.3 (L) 30.0 - 36.0 g/dL   RDW 15.1 11.5 - 15.5 %   Platelets 333 150 - 400 K/uL   nRBC 0.0 0.0 - 0.2 %   Neutrophils Relative % 43 %   Neutro Abs 2.1 1.7 - 7.7 K/uL   Lymphocytes Relative 45 %   Lymphs Abs 2.3 0.7 - 4.0 K/uL   Monocytes Relative 9 %   Monocytes Absolute  0.4 0.1 - 1.0 K/uL   Eosinophils Relative 2 %   Eosinophils Absolute 0.1 0.0 - 0.5 K/uL   Basophils Relative 1 %   Basophils Absolute 0.0 0.0 - 0.1 K/uL   Immature Granulocytes 0 %   Abs Immature Granulocytes 0.00 0.00 - 0.07 K/uL  Protime-INR  Result Value Ref Range   Prothrombin Time 12.0 11.4 - 15.2 seconds   INR 0.9 0.8 - 1.2  Basic metabolic panel  Result Value Ref Range   Sodium 136 135 - 145 mmol/L   Potassium 3.6 3.5 - 5.1 mmol/L   Chloride 105 98 - 111 mmol/L   CO2 25 22 - 32  mmol/L   Glucose, Bld 90 70 - 99 mg/dL   BUN 15 6 - 20 mg/dL   Creatinine, Ser 0.51 0.44 - 1.00 mg/dL   Calcium 9.2 8.9 - 10.3 mg/dL   GFR, Estimated >60 >60 mL/min   Anion gap 6 5 - 15  hCG, serum, qualitative  Result Value Ref Range   Preg, Serum NEGATIVE NEGATIVE     Treatments: Bilateral uterine artery embolization on 10/28/2021  Discharge Exam: Blood pressure 121/76, pulse 70, temperature 97.6 F (36.4 C), temperature source Oral, resp. rate 18, height 5\' 5"  (1.651 m), weight 175 lb (79.4 kg), last menstrual period 10/14/2021, SpO2 100 %. Awake, alert.  Chest clear to auscultation bilaterally.  Heart with regular rate and rhythm.  Abdomen soft, positive bowel sounds, some minimal anterior pelvic tenderness to palpation, fibroid uterus.  Puncture sites right and left common femoral arteries soft, clean, dry, nontender, bandages in place, no hematomas.  No lower extremity edema, intact distal pulses.  Disposition: Discharge disposition: 01-Home or Self Care       Discharge Instructions     Call MD for:  difficulty breathing, headache or visual disturbances   Complete by: As directed    Call MD for:  extreme fatigue   Complete by: As directed    Call MD for:  hives   Complete by: As directed    Call MD for:  persistant dizziness or light-headedness   Complete by: As directed    Call MD for:  persistant nausea and vomiting   Complete by: As directed    Call MD for:  redness, tenderness, or signs of infection (pain, swelling, redness, odor or green/yellow discharge around incision site)   Complete by: As directed    Call MD for:  severe uncontrolled pain   Complete by: As directed    Call MD for:  temperature >100.4   Complete by: As directed    Change dressing (specify)   Complete by: As directed    May remove bandages from both groin sites today and apply Band-Aid to areas daily for the next 2 to 3 days.  May wash sites with soap and water.   Diet - low sodium heart  healthy   Complete by: As directed    Discharge instructions   Complete by: As directed    Stay well-hydrated, take ibuprofen 200 mg, 3 tablets every 6 hours for the next 5 days.  May take Vicodin/Norco for any breakthrough pain.   Driving Restrictions   Complete by: As directed    Avoid driving for the next 24 hours or after taking narcotic medications   Increase activity slowly   Complete by: As directed    Lifting restrictions   Complete by: As directed    Avoid heavy lifting for the next 3 to 4 days  May shower / Bathe   Complete by: As directed    May walk up steps   Complete by: As directed    Sexual Activity Restrictions   Complete by: As directed    No sexual intercourse for 1 week      Allergies as of 10/29/2021       Reactions   Excedrin Extra Strength [asa-apap-caff Buffered] Other (See Comments)   Burns chest ( can take with omeprazole)        Medication List     TAKE these medications    docusate sodium 100 MG capsule Commonly known as: COLACE Take 1 capsule (100 mg total) by mouth 2 (two) times daily.   gabapentin 100 MG capsule Commonly known as: NEURONTIN Take 1-3 capsules (100-300 mg total) by mouth at bedtime.   HYDROcodone-acetaminophen 5-325 MG tablet Commonly known as: NORCO/VICODIN Take 1-2 tablets by mouth every 6 (six) hours as needed for moderate pain.   IRON PO Take 1 tablet by mouth daily.   levocetirizine 5 MG tablet Commonly known as: XYZAL Take 5 mg by mouth daily as needed for allergies.   MULTI VITAMIN DAILY PO Take 2 tablets by mouth daily.   omeprazole 20 MG capsule Commonly known as: PRILOSEC TAKE 1 CAPSULE TWICE A DAY What changed: when to take this   promethazine 25 MG tablet Commonly known as: PHENERGAN Take 1 tablet (25 mg total) by mouth every 8 (eight) hours as needed for nausea.   sertraline 50 MG tablet Commonly known as: ZOLOFT Take 50 mg by mouth daily.   tiZANidine 2 MG tablet Commonly known as:  ZANAFLEX TAKE 1 TO 2 TABLETS BY MOUTH EVERY 6 HOURS AS NEEDED FOR MUSCLE SPASMS What changed: See the new instructions.               Discharge Care Instructions  (From admission, onward)           Start     Ordered   10/29/21 0000  Change dressing (specify)       Comments: May remove bandages from both groin sites today and apply Band-Aid to areas daily for the next 2 to 3 days.  May wash sites with soap and water.   10/29/21 1026            Follow-up Information     Greggory Keen, MD Follow up.   Specialties: Interventional Radiology, Radiology Why: Radiology service will call you with follow-up appointment with Dr. Annamaria Boots in 3 to 4 weeks; call [phone] or [phone] with any questions Contact information: [address] [place] [address] [phone]         Servando Salina, MD Follow up.   Specialty: Obstetrics and Gynecology Why: Follow-up with Dr. Garwin Brothers as scheduled Contact information: [address] [address] [phone]                  Electronically Signed: D. Rowe Robert, PA-C 10/29/2021, 10:30 AM   I have spent Less Than 30 Minutes discharging Early Osmond.

## 2021-11-06 ENCOUNTER — Other Ambulatory Visit: Payer: Self-pay | Admitting: Student

## 2021-11-06 MED ORDER — HYDROCODONE-ACETAMINOPHEN 5-325 MG PO TABS: 1.0000 | ORAL_TABLET | Freq: Four times a day (QID) | ORAL | 0 refills | Status: AC | PRN

## 2021-11-28 ENCOUNTER — Other Ambulatory Visit: Payer: Self-pay

## 2021-11-28 ENCOUNTER — Ambulatory Visit
Admission: RE | Admit: 2021-11-28 | Discharge: 2021-11-28 | Disposition: A | Discharge status: Home / Self Care | Payer: Commercial Managed Care - PPO | Source: Ambulatory Visit | Attending: Radiology | Admitting: Radiology

## 2021-11-28 ENCOUNTER — Encounter: Payer: Self-pay | Admitting: *Deleted

## 2021-11-28 DIAGNOSIS — D259 Leiomyoma of uterus, unspecified: Secondary | ICD-10-CM

## 2021-11-28 HISTORY — PX: IR RADIOLOGIST EVAL & MGMT: IMG5224

## 2021-11-28 NOTE — Progress Notes (Signed)
Patient ID: Crystal Mcintyre, female   DOB: December 30, 1975, 44 y.o.   MRN: [id number]       Chief Complaint:  Dysfunctional uterine bleeding, fibroids  Referring Physician(s): Dr. Garwin Brothers  History of Present Illness: Crystal Mcintyre is a 45 y.o. female who is now 1 month status post uterine fibroid embolization.  Procedure was performed 10/28/2021 at [facility].  She had an overnight recovery following the procedure without complication.  Over the last month she has recovered very well.  Very minor lower abdominal and pelvic pain and cramping.  This continues to improve.  Pain is controlled with 100 mg Advil.  No recent illness or fever.  Slight vaginal bleeding/discharge also improving which she feels is a resolving menstrual cycle.  She has back to work.  No other significant issues.  Past Medical History:  Diagnosis Date   GOITER, MULTINODULAR 04/18/2009   Headache(784.0) 06/17/2007   IRREGULAR MENSTRUATION 06/17/2007   Other anxiety states 01/30/2010   THYROID STIMULATING HORMONE, ABNORMAL 01/10/2009   TOBACCO USE 01/10/2008   TRANSAMINASES, SERUM, ELEVATED 01/10/2009    Past Surgical History:  Procedure Laterality Date   IR ANGIOGRAM PELVIS SELECTIVE OR SUPRASELECTIVE  10/28/2021   IR ANGIOGRAM PELVIS SELECTIVE OR SUPRASELECTIVE  10/28/2021   IR ANGIOGRAM SELECTIVE EACH ADDITIONAL VESSEL  10/28/2021   IR ANGIOGRAM SELECTIVE EACH ADDITIONAL VESSEL  10/28/2021   IR EMBO TUMOR ORGAN ISCHEMIA INFARCT INC GUIDE ROADMAPPING  10/28/2021   IR RADIOLOGIST EVAL & MGMT  07/24/2021   IR RADIOLOGIST EVAL & MGMT  11/28/2021   IR US GUIDE VASC ACCESS LEFT  10/28/2021   IR US GUIDE VASC ACCESS RIGHT  10/28/2021   MASS EXCISION Right 07/04/2021   Procedure: RIGHT THUMB EXCISION MUCOID CYST AND DEBRIDEMENT INTERPHALANGEAL JOINT ARTHRITIS;  Surgeon: Leanora Cover, MD;  Location: [facility];  Service: Orthopedics;  Laterality: Right;   TONSILLECTOMY  as child   TYMPANOPLASTY  4 yrs  ago   TYMPANOSTOMY TUBE PLACEMENT  as child   UPPER GASTROINTESTINAL ENDOSCOPY      Allergies: Excedrin extra strength [asa-apap-caff buffered]  Medications: Prior to Admission medications   Medication Sig Start Date End Date Taking? Authorizing Provider  docusate sodium (COLACE) 100 MG capsule Take 1 capsule (100 mg total) by mouth 2 (two) times daily. 10/29/21   Allred, Darrell K, PA-C  Ferrous Sulfate (IRON PO) Take 1 tablet by mouth daily.    [provider]  gabapentin (NEURONTIN) 100 MG capsule Take 1-3 capsules (100-300 mg total) by mouth at bedtime. 03/19/21   Panosh, [name], MD  levocetirizine (XYZAL) 5 MG tablet Take 5 mg by mouth daily as needed for allergies.    [provider]  Multiple Vitamin (MULTI VITAMIN DAILY PO) Take 2 tablets by mouth daily.    [provider]  omeprazole (PRILOSEC) 20 MG capsule TAKE 1 CAPSULE TWICE A DAY Patient taking differently: Take 20 mg by mouth 2 (two) times daily before a meal. 10/07/13   Pyrtle, Lajuan Lines, MD  promethazine (PHENERGAN) 25 MG tablet Take 1 tablet (25 mg total) by mouth every 8 (eight) hours as needed for nausea. 10/29/21   Allred, Darrell K, PA-C  sertraline (ZOLOFT) 50 MG tablet Take 50 mg by mouth daily.    [provider]  tiZANidine (ZANAFLEX) 2 MG tablet TAKE 1 TO 2 TABLETS BY MOUTH EVERY 6 HOURS AS NEEDED FOR MUSCLE SPASMS Patient taking differently: Take 2-4 mg by mouth every 6 (six) hours as  needed for muscle spasms. 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      Social Determinants of Health   Financial Resource Strain: Not on file  Food Insecurity: Not on file  Transportation Needs: Not on file  Physical Activity: Not on file  Stress: Not on file  Social Connections: Not on file     Review of Systems  Review of Systems: A 12 point ROS discussed and pertinent positives are indicated in the HPI above.  All other systems are negative.  Physical Exam No direct physical exam was performed telephone visit only today Vital Signs: There were no vitals taken for this visit.  Imaging: IR Radiologist Eval & Mgmt  Result Date: 11/28/2021 Please refer to notes tab for details about interventional procedure. (Op Note)   Labs:  CBC: Recent Labs    10/28/21 0740  WBC 4.9  HGB 10.4*  HCT 35.5*  PLT 333    COAGS: Recent Labs    10/28/21 0740  INR 0.9    BMP: Recent Labs    10/28/21 0740  NA 136  K 3.6  CL 105  CO2 25  GLUCOSE 90  BUN 15  CALCIUM 9.2  CREATININE 0.51  GFRNONAA >60    LIVER FUNCTION TESTS: No results for input(s): BILITOT, AST, ALT, ALKPHOS, PROT, ALBUMIN in the last 8760 hours.  Assessment and Plan:  1 month status post uterine fibroid embolization for symptomatic uterine fibroids, abnormal uterine bleeding, and  associated bulk related symptoms with urinary frequency and urgency.  She also had iron deficiency anemia requiring iron supplementation.  Overall she is recovered very well in the last month.  Plan: Scheduled for posttreatment pelvic MRI without and with contrast in 5 months with an outpatient office visit.   Electronically Signed: Greggory Keen 11/28/2021, 11:14 AM   I spent a total of    25 Minutes in remote  clinical consultation, greater than 50% of which was counseling/coordinating care for this patient with symptomatic uterine fibroid status post embolization.  Visit type: Audio only (telephone). Audio (no video) only due to computer restrictions. Alternative for in-person consultation at [facility], [address] [place], [place], [place]. This visit type was conducted due to national recommendations for restrictions regarding the COVID-19 Pandemic (e.g. social distancing).  This format is felt to be most appropriate for this patient at this time.  All issues noted in this document were discussed and addressed.

## 2022-01-28 NOTE — Progress Notes (Signed)
Chief Complaint  Patient presents with   Medication Refill    HPI: Crystal Mcintyre 46 y.o. come in for med  evaluation She is perimenopausal and has been taking gabapentin 200 to 300 mg at night which is quite helpful for her hot flashes and does help her sleep instead of multiple awakenings she mainly wake in 1 time She did run out of it last week and has had some difficulties with severe flushes.  Sertraline ran out off and  for  a month  back on  per dr Garwin Brothers.  Is also been helpful  Had leiomyoma ablation surgery has done well abdomen not as hard she does not have pain all the time and feels much better and not as hard   brown dc no bleeding. To have  anemia check in future months by specialist.Months.  ROS: See pertinent positives and negatives per HPI.  Past Medical History:  Diagnosis Date   GOITER, MULTINODULAR 04/18/2009   Headache(784.0) 06/17/2007   IRREGULAR MENSTRUATION 06/17/2007   Other anxiety states 01/30/2010   THYROID STIMULATING HORMONE, ABNORMAL 01/10/2009   TOBACCO USE 01/10/2008   TRANSAMINASES, SERUM, ELEVATED 01/10/2009    Family History  Problem Relation Age of Onset   Pneumonia Father    Alcohol abuse Father    Colon polyps Mother    Breast cancer Paternal Aunt    Diabetes Maternal Grandfather    Prostate cancer Paternal Grandfather    Breast cancer Paternal Aunt    Rectal cancer Neg Hx    Stomach cancer Neg Hx    Colon cancer Neg Hx     Social History   Socioeconomic History   Marital status: Married    Spouse name: Not on file   Number of children: Not on file   Years of education: Not on file   Highest education level: Not on file  Occupational History   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      Social Determinants of Health   Financial Resource Strain: Not on file  Food Insecurity: Not on file  Transportation Needs: Not on file  Physical Activity: Not on file  Stress: Not on file  Social Connections: Not on file    Outpatient Medications Prior to Visit  Medication Sig Dispense Refill   levocetirizine (XYZAL) 5 MG tablet Take 5 mg by mouth daily as needed for allergies.     Multiple Vitamin (MULTI VITAMIN DAILY PO) Take 2 tablets by mouth daily.     omeprazole (PRILOSEC) 20 MG capsule TAKE 1 CAPSULE TWICE A DAY (Patient taking differently: Take 20 mg by mouth 2 (two) times daily before a meal.) 60 capsule 1   sertraline (ZOLOFT) 50 MG tablet Take 50 mg by mouth daily.     gabapentin (NEURONTIN) 100 MG capsule Take  1-3 capsules (100-300 mg total) by mouth at bedtime. 60 capsule 3   docusate sodium (COLACE) 100 MG capsule Take 1 capsule (100 mg total) by mouth 2 (two) times daily. (Patient not taking: Reported on 01/29/2022) 10 capsule 0   Ferrous Sulfate (IRON PO) Take 1 tablet by mouth daily. (Patient not taking: Reported on 01/29/2022)     promethazine (PHENERGAN) 25 MG tablet Take 1 tablet (25 mg total) by mouth every 8 (eight) hours as needed for nausea. (Patient not taking: Reported on 01/29/2022) 30 tablet 0   tiZANidine (ZANAFLEX) 2 MG tablet TAKE 1 TO 2 TABLETS BY MOUTH EVERY 6 HOURS AS NEEDED FOR MUSCLE SPASMS (Patient not taking: Reported on 01/29/2022) 180 tablet 1   No facility-administered medications prior to visit.     EXAM:  BP 112/82 (BP Location: Left Arm, Patient Position: Sitting, Cuff Size: Normal)    Pulse 91    Temp 98.8 F (37.1 C) (Oral)    Ht 5\' 5"  (1.651 m)     Wt 169 lb (76.7 kg)    SpO2 99%    BMI 28.12 kg/m   Body mass index is 28.12 kg/m.  GENERAL: vitals reviewed and listed above, alert, oriented, appears well hydrated and in no acute distress HEENT: atraumatic, conjunctiva  clear, no obvious abnormalities on inspection of external nose and ears OP : Masked NECK: no obvious masses on inspection palpation  LUNGS: clear to auscultation bilaterally, no wheezes, rales or rhonchi, good air movement CV: HRRR, no clubbing cyanosis or  peripheral edema nl cap refill  Abdomen soft without organomegaly guarding or rebound abdominal mass not felt or soft MS: moves all extremities without noticeable focal  abnormality PSYCH: pleasant and cooperative, no obvious depression or anxiety Lab Results  Component Value Date   WBC 4.9 10/28/2021   HGB 10.4 (L) 10/28/2021   HCT 35.5 (L) 10/28/2021   PLT 333 10/28/2021   GLUCOSE 90 10/28/2021   CHOL 194 08/02/2020   TRIG 80 08/02/2020   HDL 69 08/02/2020   LDLCALC 108 (H) 08/02/2020   ALT 9 08/02/2020   AST 16 08/02/2020   NA 136 10/28/2021   K 3.6 10/28/2021   CL 105 10/28/2021   CREATININE 0.51 10/28/2021   BUN 15 10/28/2021   CO2 25 10/28/2021   TSH 0.91 06/21/2020   INR 0.9 10/28/2021   HGBA1C 4.9 08/02/2020   BP Readings from Last 3 Encounters:  01/29/22 112/82  10/29/21 121/76  10/28/21 122/85    ASSESSMENT AND PLAN:  Discussed the following assessment and plan:  Hot flushes, perimenopausal  Perimenopausal  Medication management  Insomnia, unspecified type Doing much better controlling nocturnal flushes and sleep with gabapentin and back on sertraline per Dr. Garwin Brothers. She is feeling positive after procedure ablation for her large leiomyomata uterine. Suspect her anemia will improve. She has plans to do colon cancer screening colonoscopy. Discussed menopausal symptoms to continue the sertraline and gabapentin at this point although can titrate. She can also discuss with Dr.  Garwin Brothers but at this time it appears that there is much more benefit than risk.  -Patient advised to return or notify health care team  if  new concerns arise.  Patient Instructions  Good to see  you again .  Ok to refill gabapentin for a year  Get you colon cancer screening   Ok to  stay on gabapentin .  Can also get input with  GYNE.  Sertraline also can help .   [name].  Cayson Kalb M.D.

## 2022-01-29 ENCOUNTER — Encounter: Payer: Self-pay | Admitting: Internal Medicine

## 2022-01-29 ENCOUNTER — Ambulatory Visit (INDEPENDENT_AMBULATORY_CARE_PROVIDER_SITE_OTHER): Payer: Commercial Managed Care - PPO | Admitting: Internal Medicine

## 2022-01-29 VITALS — BP 112/82 | HR 91 | Temp 98.8°F | Ht 65.0 in | Wt 169.0 lb

## 2022-01-29 DIAGNOSIS — Z79899 Other long term (current) drug therapy: Secondary | ICD-10-CM

## 2022-01-29 DIAGNOSIS — G4700 Insomnia, unspecified: Secondary | ICD-10-CM | POA: Diagnosis not present

## 2022-01-29 DIAGNOSIS — N951 Menopausal and female climacteric states: Secondary | ICD-10-CM

## 2022-01-29 MED ORDER — GABAPENTIN 100 MG PO CAPS: 200.0000 mg | ORAL_CAPSULE | Freq: Every day | ORAL | 11 refills | Status: DC

## 2022-01-29 NOTE — Patient Instructions (Addendum)
Good to see  you again .  Ok to refill gabapentin for a year  Get you colon cancer screening   Ok to  stay on gabapentin .  Can also get input with  GYNE.  Sertraline also can help .

## 2022-03-07 ENCOUNTER — Encounter: Payer: Self-pay | Admitting: *Deleted

## 2022-03-13 ENCOUNTER — Telehealth: Payer: Self-pay

## 2022-03-13 ENCOUNTER — Ambulatory Visit (INDEPENDENT_AMBULATORY_CARE_PROVIDER_SITE_OTHER): Payer: Commercial Managed Care - PPO | Admitting: Internal Medicine

## 2022-03-13 ENCOUNTER — Encounter: Payer: Self-pay | Admitting: Internal Medicine

## 2022-03-13 VITALS — BP 128/90 | HR 78 | Ht 64.0 in | Wt 164.5 lb

## 2022-03-13 DIAGNOSIS — Z1211 Encounter for screening for malignant neoplasm of colon: Secondary | ICD-10-CM

## 2022-03-13 DIAGNOSIS — K219 Gastro-esophageal reflux disease without esophagitis: Secondary | ICD-10-CM

## 2022-03-13 DIAGNOSIS — R0789 Other chest pain: Secondary | ICD-10-CM | POA: Diagnosis not present

## 2022-03-13 DIAGNOSIS — K824 Cholesterolosis of gallbladder: Secondary | ICD-10-CM

## 2022-03-13 MED ORDER — PANTOPRAZOLE SODIUM 40 MG PO TBEC: 40.0000 mg | DELAYED_RELEASE_TABLET | Freq: Two times a day (BID) | ORAL | 3 refills | Status: DC

## 2022-03-13 MED ORDER — NA SULFATE-K SULFATE-MG SULF 17.5-3.13-1.6 GM/177ML PO SOLN: 1.0000 | ORAL | 0 refills | Status: DC

## 2022-03-13 NOTE — Patient Instructions (Signed)
We have sent the following medications to your pharmacy for you to pick up at your convenience: Suprep and Pantoprazole ? ?Stop Omeprazole ? ?Start Pantoprazole '40mg'$  -Take 1 tablet by mouth twice daily.  ? ?If you are age 47 or older, your body mass index should be between 23-30. Your Body mass index is 28.24 kg/m?Marland Kitchen If this is out of the aforementioned range listed, please consider follow up with your Primary Care Provider. ? ?If you are age 45 or younger, your body mass index should be between 19-25. Your Body mass index is 28.24 kg/m?Marland Kitchen If this is out of the aformentioned range listed, please consider follow up with your Primary Care Provider.  ? ?________________________________________________________ ? ?The [facility] providers would like to encourage you to use [facility] to communicate with providers for non-urgent requests or questions.  Due to long hold times on the telephone, sending your provider a message by [facility] may be a faster and more efficient way to get a response.  Please allow 48 business hours for a response.  Please remember that this is for non-urgent requests.  ?_______________________________________________________ ? ?Thank you for choosing me and [facility]. ? ? ? ?

## 2022-03-13 NOTE — Progress Notes (Signed)
Patient ID: Crystal Mcintyre, female   DOB: 09/16/1976, 46 y.o.   MRN: [id number] HPI: Crystal Mcintyre is a 46 year old female with a history of NSAID induced gastric ulcer, possible GERD, history of symptomatic uterine fibroids status post embolization, history of anxiety, migraines who is seen in consult at the request of Dr. Fabian Sharp to evaluate GERD and atypical chest pain.  She is here alone today.  She is known to me from about 10 years ago when I saw her for what was found to be a NSAID induced gastric ulcer.  H. pylori was negative.  She was started on PPI at that time and the ulcer was documented to have healed as of November 2013.  Since that time she reports that she has been dealing with what she thinks is GERD.  She does not have heartburn or pyrosis.  She reports a cramping spasm type pain in her lower chest between her breasts or near the sternal notch.  She reports this happens almost on a daily basis.  It feels like a grabbing or spasm type pain and can last for 15 to 20 minutes.  Can become severe.  Thinks it may be worse after eating.  She avoids spicy foods, tries to prop the head of her bed and eat earlier in the evening.  She has a variable appetite.  Stress makes it worse.  Can be present on waking or even wake her from sleep.  Sometimes drinks milk to try to calm her esophagus.  She has slowly increased her PPI dose over the years initially thinking this drug may be helping her.  She always takes 40 mg of omeprazole twice daily but on bad days takes 40 mg 4 times a day.  No vomiting.  Bowel movements mostly regular though she can at times feel constipated.  This seems to be better after having her uterine fibroid ablation.  She previously occasionally used Colace.  No blood in stool or melena.  She works in Chief Financial Officer for [employer].  They handle therapy in senior living facility such as [facility].  Her migraines are better than they used to be.  She was having steroid  injections but these became so painful she stopped using them.  She is using less Excedrin than she did in the past though still uses some Excedrin.  Less vaginal bleeding since her uterine fibroid ablation.  No full period in over 2 months though some brown discharge at the time of her menstrual cycle.  Past Medical History:  Diagnosis Date   Gastric ulcer    GOITER, MULTINODULAR 04/18/2009   Headache(784.0) 06/17/2007   IRREGULAR MENSTRUATION 06/17/2007   Other anxiety states 01/30/2010   THYROID STIMULATING HORMONE, ABNORMAL 01/10/2009   TOBACCO USE 01/10/2008   TRANSAMINASES, SERUM, ELEVATED 01/10/2009    Past Surgical History:  Procedure Laterality Date   IR ANGIOGRAM PELVIS SELECTIVE OR SUPRASELECTIVE  10/28/2021   IR ANGIOGRAM PELVIS SELECTIVE OR SUPRASELECTIVE  10/28/2021   IR ANGIOGRAM SELECTIVE EACH ADDITIONAL VESSEL  10/28/2021   IR ANGIOGRAM SELECTIVE EACH ADDITIONAL VESSEL  10/28/2021   IR EMBO TUMOR ORGAN ISCHEMIA INFARCT INC GUIDE ROADMAPPING  10/28/2021   IR RADIOLOGIST EVAL & MGMT  07/24/2021   IR RADIOLOGIST EVAL & MGMT  11/28/2021   IR US GUIDE VASC ACCESS LEFT  10/28/2021   IR US GUIDE VASC ACCESS RIGHT  10/28/2021   MASS EXCISION Right 07/04/2021   Procedure: RIGHT THUMB EXCISION MUCOID CYST AND DEBRIDEMENT INTERPHALANGEAL JOINT ARTHRITIS;  Surgeon: Betha Loa, MD;  Location: [facility];  Service: Orthopedics;  Laterality: Right;   TONSILLECTOMY  as child   TYMPANOPLASTY  4 yrs ago   TYMPANOSTOMY TUBE PLACEMENT  as child   UPPER GASTROINTESTINAL ENDOSCOPY      Outpatient Medications Prior to Visit  Medication Sig Dispense Refill   gabapentin (NEURONTIN) 100 MG capsule Take 2-3 capsules (200-300 mg total) by mouth at bedtime. 90 capsule 11   Multiple Vitamin (MULTI VITAMIN DAILY PO) Take 2 tablets by mouth daily.     sertraline (ZOLOFT) 50 MG tablet Take 50 mg by mouth daily.     omeprazole (PRILOSEC) 20 MG capsule TAKE 1 CAPSULE TWICE A DAY  (Patient taking differently: Take 20 mg by mouth 2 (two) times daily before a meal.) 60 capsule 1   docusate sodium (COLACE) 100 MG capsule Take 1 capsule (100 mg total) by mouth 2 (two) times daily. (Patient not taking: Reported on 01/29/2022) 10 capsule 0   Ferrous Sulfate (IRON PO) Take 1 tablet by mouth daily. (Patient not taking: Reported on 01/29/2022)     levocetirizine (XYZAL) 5 MG tablet Take 5 mg by mouth daily as needed for allergies.     promethazine (PHENERGAN) 25 MG tablet Take 1 tablet (25 mg total) by mouth every 8 (eight) hours as needed for nausea. (Patient not taking: Reported on 01/29/2022) 30 tablet 0   tiZANidine (ZANAFLEX) 2 MG tablet TAKE 1 TO 2 TABLETS BY MOUTH EVERY 6 HOURS AS NEEDED FOR MUSCLE SPASMS (Patient not taking: Reported on 01/29/2022) 180 tablet 1   No facility-administered medications prior to visit.    Allergies  Allergen Reactions   Excedrin Extra Strength [Asa-Apap-Caff Buffered] Other (See Comments)    Burns chest ( can take with omeprazole)    Family History  Problem Relation Age of Onset   Pneumonia Father    Alcohol abuse Father    Colon polyps Mother    Breast cancer Paternal Aunt    Diabetes Maternal Grandfather    Prostate cancer Paternal Grandfather    Breast cancer Paternal Aunt    Rectal cancer Neg Hx    Stomach cancer Neg Hx    Colon cancer Neg Hx     Social History   Tobacco Use   Smoking status: Former    Types: Cigarettes    Quit date: 12/29/2008    Years since quitting: 13.2   Smokeless tobacco: Never  Vaping Use   Vaping Use: Never used  Substance Use Topics   Alcohol use: Yes    Comment: occasional   Drug use: No    ROS: As per history of present illness, otherwise negative  BP 128/90 (BP Location: Left Arm, Patient Position: Sitting, Cuff Size: Normal)   Pulse 78   Ht 5\' 4"  (1.626 m)   Wt 164 lb 8 oz (74.6 kg)   SpO2 99%   BMI 28.24 kg/m  Gen: awake, alert, NAD HEENT: anicteric  CV: RRR, no mrg Pulm: CTA  b/l Abd: soft, NT/ND, +BS throughout Ext: no c/c/e Neuro: nonfocal   RELEVANT LABS AND IMAGING: CBC    Component Value Date/Time   WBC 4.9 10/28/2021 0740   RBC 4.50 10/28/2021 0740   HGB 10.4 (L) 10/28/2021 0740   HCT 35.5 (L) 10/28/2021 0740   PLT 333 10/28/2021 0740   MCV 78.9 (L) 10/28/2021 0740   MCH 23.1 (L) 10/28/2021 0740   MCHC 29.3 (L) 10/28/2021 0740   RDW 15.1 10/28/2021 0740  LYMPHSABS 2.3 10/28/2021 0740   MONOABS 0.4 10/28/2021 0740   EOSABS 0.1 10/28/2021 0740   BASOSABS 0.0 10/28/2021 0740    CMP     Component Value Date/Time   NA 136 10/28/2021 0740   K 3.6 10/28/2021 0740   CL 105 10/28/2021 0740   CO2 25 10/28/2021 0740   GLUCOSE 90 10/28/2021 0740   BUN 15 10/28/2021 0740   CREATININE 0.51 10/28/2021 0740   CREATININE 0.81 08/02/2020 0955   CALCIUM 9.2 10/28/2021 0740   PROT 6.9 08/02/2020 0955   ALBUMIN 4.6 12/02/2018 0923   AST 16 08/02/2020 0955   ALT 9 08/02/2020 0955   ALKPHOS 57 12/02/2018 0923   BILITOT 0.2 08/02/2020 0955   GFRNONAA >60 10/28/2021 0740   GFRNONAA 88 08/02/2020 0955   GFRAA 102 08/02/2020 0955    ASSESSMENT/PLAN: 46 year old female with a history of NSAID induced gastric ulcer, possible GERD, history of symptomatic uterine fibroids status post embolization, history of anxiety, migraines who is seen in consult at the request of Dr. Fabian Sharp to evaluate GERD and atypical chest pain.   Atypical chest pain/? GERD --she is taking high-dose PPI and on some days excessive doses of PPI is much as 160 mg of omeprazole.  We discussed her symptoms at length and I am more suspicious for esophageal spasm than uncontrolled GERD.  I do think it is reasonable to repeat upper endoscopy but if this is negative I would recommend esophageal manometry and 24-hour pH plus impedance.  This may in fact be esophageal spasm and not related all to uncontrolled reflux. --Change to pantoprazole 40 mg twice daily AC; patient advised not to exceed this  dose in any 24-hour period. --Upper endoscopy; we reviewed the risk, benefits and alternatives and she is agreeable and wishes to proceed --Consideration of 24-hour pH impedance testing if upper endoscopy unrevealing  2.  Colon cancer screening --screening colonoscopy recommended based on age.  We reviewed the risk, benefits and alternatives and she is agreeable and wishes to proceed --Colonoscopy in the [facility]  3.  History of gallbladder polyp --small and stable seen a decade ago by imaging.  Given her lower chest and possibly epigastric discomfort I recommend right upper quadrant ultrasound to rule out significant gallbladder pathology --Right upper quadrant ultrasound     XL:[id number], Neta Mends, Md [address] [place],  [place] [address]

## 2022-03-13 NOTE — Telephone Encounter (Signed)
Called and spoke with patient informed her of Dr Vena Rua recommendations. Pt voiced understanding and has been scheduled.  ? ?You have been scheduled for an abdominal ultrasound at [facility] (1st floor of hospital) on 03/20/22 at 8:30am. Please arrive 15 minutes prior to your appointment for registration. Make certain not to have anything to eat or drink 6 hours prior to your appointment. Should you need to reschedule your appointment, please contact radiology at [phone]. This test typically takes about 30 minutes to perform. ? ?

## 2022-03-13 NOTE — Telephone Encounter (Signed)
-----   Message from Jerene Bears, MD sent at 03/13/2022  1:09 PM EDT ----- ?I am sorry I missed this, but I would like pt to have a RUQ [place]. ?Hx gallbladder polyp, exclude gallbladder disease ? ?Let patient know when I reviewed her chart she had a small but stable gallbladder polyp which we saw nearly 10 years ago.  Given the pain that she is having and it is atypical in nature, I would like to image the gallbladder just to ensure no concerning gallbladder disease. ? ?Thanks ?JMP ? ?

## 2022-03-20 ENCOUNTER — Other Ambulatory Visit: Payer: Self-pay

## 2022-03-20 ENCOUNTER — Ambulatory Visit (HOSPITAL_COMMUNITY)
Admission: RE | Admit: 2022-03-20 | Discharge: 2022-03-20 | Disposition: A | Discharge status: Home / Self Care | Payer: Commercial Managed Care - PPO | Source: Ambulatory Visit | Attending: Internal Medicine | Admitting: Internal Medicine

## 2022-03-20 DIAGNOSIS — K829 Disease of gallbladder, unspecified: Secondary | ICD-10-CM | POA: Diagnosis present

## 2022-03-20 DIAGNOSIS — K824 Cholesterolosis of gallbladder: Secondary | ICD-10-CM | POA: Insufficient documentation

## 2022-04-15 ENCOUNTER — Telehealth: Payer: Self-pay | Admitting: Internal Medicine

## 2022-04-15 ENCOUNTER — Encounter: Payer: Self-pay | Admitting: *Deleted

## 2022-04-15 MED ORDER — SUTAB 1479-225-188 MG PO TABS: ORAL_TABLET | ORAL | 0 refills | Status: DC

## 2022-04-15 NOTE — Telephone Encounter (Signed)
Inbound call from patient reports Suprep is over $120. Is requesting an alternative medication for prep ?

## 2022-04-15 NOTE — Telephone Encounter (Signed)
I have spoken to patient to advise that we will send sutab in place of suprep. Manufacturer coupon should make script no more than $60. This is acceptable to patient. New instructions have been available in [place] for Sutab as well. ?

## 2022-04-23 ENCOUNTER — Other Ambulatory Visit: Payer: Self-pay | Admitting: Interventional Radiology

## 2022-04-23 DIAGNOSIS — D259 Leiomyoma of uterus, unspecified: Secondary | ICD-10-CM

## 2022-04-25 ENCOUNTER — Encounter: Payer: Self-pay | Admitting: Internal Medicine

## 2022-05-02 ENCOUNTER — Encounter: Payer: Self-pay | Admitting: Internal Medicine

## 2022-05-02 ENCOUNTER — Ambulatory Visit (AMBULATORY_SURGERY_CENTER): Payer: Commercial Managed Care - PPO | Admitting: Internal Medicine

## 2022-05-02 VITALS — BP 106/70 | HR 62 | Temp 98.7°F | Resp 12

## 2022-05-02 DIAGNOSIS — R0789 Other chest pain: Secondary | ICD-10-CM | POA: Diagnosis not present

## 2022-05-02 DIAGNOSIS — K317 Polyp of stomach and duodenum: Secondary | ICD-10-CM | POA: Diagnosis not present

## 2022-05-02 DIAGNOSIS — K2289 Other specified disease of esophagus: Secondary | ICD-10-CM | POA: Diagnosis not present

## 2022-05-02 DIAGNOSIS — K219 Gastro-esophageal reflux disease without esophagitis: Secondary | ICD-10-CM

## 2022-05-02 DIAGNOSIS — Z1211 Encounter for screening for malignant neoplasm of colon: Secondary | ICD-10-CM

## 2022-05-02 MED ORDER — SODIUM CHLORIDE 0.9 % IV SOLN
500.0000 mL | Freq: Once | INTRAVENOUS | Status: DC
Start: 2022-05-02 — End: 2022-05-02

## 2022-05-02 NOTE — Patient Instructions (Addendum)
YOU HAD AN ENDOSCOPIC PROCEDURE TODAY AT THE [facility]:   Refer to the procedure report that was given to you for any specific questions about what was found during the examination.  If the procedure report does not answer your questions, please call your gastroenterologist to clarify.  If you requested that your care partner not be given the details of your procedure findings, then the procedure report has been included in a sealed envelope for you to review at your convenience later.  YOU SHOULD EXPECT: Some feelings of bloating in the abdomen. Passage of more gas than usual.  Walking can help get rid of the air that was put into your GI tract during the procedure and reduce the bloating. If you had a lower endoscopy (such as a colonoscopy or flexible sigmoidoscopy) you may notice spotting of blood in your stool or on the toilet paper. If you underwent a bowel prep for your procedure, you may not have a normal bowel movement for a few days.  Please Note:  You might notice some irritation and congestion in your nose or some drainage.  This is from the oxygen used during your procedure.  There is no need for concern and it should clear up in a day or so.  SYMPTOMS TO REPORT IMMEDIATELY:   Following lower endoscopy (colonoscopy or flexible sigmoidoscopy):  Excessive amounts of blood in the stool  Significant tenderness or worsening of abdominal pains  Swelling of the abdomen that is new, acute  Fever of 100F or higher   Following upper endoscopy (EGD)  Vomiting of blood or coffee ground material  New chest pain or pain under the shoulder blades  Painful or persistently difficult swallowing  New shortness of breath  Fever of 100F or higher  Black, tarry-looking stools  For urgent or emergent issues, a gastroenterologist can be reached at any hour by calling [phone]. Do not use MyChart messaging for urgent concerns.    DIET:  We do recommend a small meal at first, but  then you may proceed to your regular diet.  Drink plenty of fluids but you should avoid alcoholic beverages for 24 hours.  ACTIVITY:  You should plan to take it easy for the rest of today and you should NOT DRIVE or use heavy machinery until tomorrow (because of the sedation medicines used during the test).    FOLLOW UP: Our staff will call the number listed on your records 48-72 hours following your procedure to check on you and address any questions or concerns that you may have regarding the information given to you following your procedure. If we do not reach you, we will leave a message.  We will attempt to reach you two times.  During this call, we will ask if you have developed any symptoms of COVID 19. If you develop any symptoms (ie: fever, flu-like symptoms, shortness of breath, cough etc.) before then, please call [phone].  If you test positive for Covid 19 in the 2 weeks post procedure, please call and report this information to us.    If any biopsies were taken you will be contacted by phone or by letter within the next 1-3 weeks.  Please call us at [phone] if you have not heard about the biopsies in 3 weeks.    SIGNATURES/CONFIDENTIALITY: You and/or your care partner have signed paperwork which will be entered into your electronic medical record.  These signatures attest to the fact that that the information above on   your After Visit Summary has been reviewed and is understood.  Full responsibility of the confidentiality of this discharge information lies with you and/or your care-partner. 

## 2022-05-02 NOTE — Progress Notes (Signed)
VSS, transported to PACU °

## 2022-05-02 NOTE — Op Note (Addendum)
[place] ?Patient Name: Crystal Mcintyre ?Procedure Date: 05/02/2022 9:14 AM ?MRN: [id number] ?Endoscopist: Jerene Bears , MD ?Age: 46 ?Referring MD:  ?Date of Birth: 12/12/1976 ?Gender: Female ?Account #: [id number] ?Procedure:                Colonoscopy ?Indications:              Screening for colorectal malignant neoplasm, This  ?                          is the patient's first colonoscopy ?Medicines:                Monitored Anesthesia Care ?Procedure:                Pre-Anesthesia Assessment: ?                          - Prior to the procedure, a History and Physical  ?                          was performed, and patient medications and  ?                          allergies were reviewed. The patient's tolerance of  ?                          previous anesthesia was also reviewed. The risks  ?                          and benefits of the procedure and the sedation  ?                          options and risks were discussed with the patient.  ?                          All questions were answered, and informed consent  ?                          was obtained. Prior Anticoagulants: The patient has  ?                          taken no previous anticoagulant or antiplatelet  ?                          agents. ASA Grade Assessment: II - A patient with  ?                          mild systemic disease. After reviewing the risks  ?                          and benefits, the patient was deemed in  ?                          satisfactory condition to undergo the procedure. ?  After obtaining informed consent, the colonoscope  ?                          was passed under direct vision. Throughout the  ?                          procedure, the patient's blood pressure, pulse, and  ?                          oxygen saturations were monitored continuously. The  ?                          Olympus PCF-H190DL (#[id number]) Colonoscope was  ?                          introduced through the anus and  advanced to the  ?                          cecum, identified by appendiceal orifice and  ?                          ileocecal valve. The colonoscopy was performed  ?                          without difficulty. The patient tolerated the  ?                          procedure well. The quality of the bowel  ?                          preparation was good. The ileocecal valve,  ?                          appendiceal orifice, and rectum were photographed. ?Scope In: 9:36:52 AM ?Scope Out: 9:54:53 AM ?Scope Withdrawal Time: 0 hours 14 minutes 38 seconds  ?Total Procedure Duration: 0 hours 18 minutes 1 second  ?Findings:                 The digital rectal exam was normal. ?                          The colon (entire examined portion) appeared normal. ?                          Internal hemorrhoids were found during  ?                          retroflexion. The hemorrhoids were small. ?Complications:            No immediate complications. ?Estimated Blood Loss:     Estimated blood loss: none. ?Impression:               - The entire examined colon is normal. ?                          - Small internal hemorrhoids. ?                          -  No specimens collected. ?Recommendation:           - Patient has a contact number available for  ?                          emergencies. The signs and symptoms of potential  ?                          delayed complications were discussed with the  ?                          patient. Return to normal activities tomorrow.  ?                          Written discharge instructions were provided to the  ?                          patient. ?                          - Resume previous diet. ?                          - Continue present medications. ?                          - Repeat colonoscopy in 10 years for screening  ?                          purposes. ?Jerene Bears, MD ?05/02/2022 10:13:39 AM ?This report has been signed electronically. ?

## 2022-05-02 NOTE — Op Note (Addendum)
[place] ?Patient Name: Crystal Mcintyre ?Procedure Date: 05/02/2022 9:15 AM ?MRN: [id number] ?Endoscopist: Jerene Bears , MD ?Age: 46 ?Referring MD:  ?Date of Birth: 1976/12/05 ?Gender: Female ?Account #: [id number] ?Procedure:                Upper GI endoscopy ?Indications:              Suspected gastro-esophageal reflux disease, chest  ?                          pain (non cardiac) -- all significantly improved  ?                          with newly started BID pantoprazole ?Medicines:                Monitored Anesthesia Care ?Procedure:                Pre-Anesthesia Assessment: ?                          - Prior to the procedure, a History and Physical  ?                          was performed, and patient medications and  ?                          allergies were reviewed. The patient's tolerance of  ?                          previous anesthesia was also reviewed. The risks  ?                          and benefits of the procedure and the sedation  ?                          options and risks were discussed with the patient.  ?                          All questions were answered, and informed consent  ?                          was obtained. Prior Anticoagulants: The patient has  ?                          taken no previous anticoagulant or antiplatelet  ?                          agents. ASA Grade Assessment: II - A patient with  ?                          mild systemic disease. After reviewing the risks  ?                          and benefits, the patient was deemed in  ?  satisfactory condition to undergo the procedure. ?                          After obtaining informed consent, the endoscope was  ?                          passed under direct vision. Throughout the  ?                          procedure, the patient's blood pressure, pulse, and  ?                          oxygen saturations were monitored continuously. The  ?                          Endoscope was  introduced through the mouth, and  ?                          advanced to the second part of duodenum. The upper  ?                          GI endoscopy was accomplished without difficulty.  ?                          The patient tolerated the procedure well. ?Scope In: ?Scope Out: ?Findings:                 The examined esophagus was normal. Biopsies were  ?                          taken with a cold forceps for histology to evaluate  ?                          for reflux inflammation. ?                          Nodular/polypoid mucosa was found in the prepyloric  ?                          region of the stomach. Query hyperplastic polyp.  ?                          Biopsies were taken with a cold forceps for  ?                          histology. ?                          The exam of the stomach was otherwise normal. ?                          Biopsies were taken with a cold forceps in the  ?                          gastric body and at  the incisura for histology and  ?                          Helicobacter pylori testing. ?                          The examined duodenum was normal. ?Complications:            No immediate complications. ?Estimated Blood Loss:     Estimated blood loss was minimal. ?Impression:               - Normal esophagus. Biopsied. ?                          - Nodular/polypoid mucosa in the prepyloric region  ?                          of the stomach. Query hyperplastic polyp. Biopsied. ?                          - Normal examined duodenum. ?                          - Biopsies were taken with a cold forceps for  ?                          histology and Helicobacter pylori testing. ?Recommendation:           - Patient has a contact number available for  ?                          emergencies. The signs and symptoms of potential  ?                          delayed complications were discussed with the  ?                          patient. Return to normal activities tomorrow.  ?                           Written discharge instructions were provided to the  ?                          patient. ?                          - Resume previous diet. ?                          - Continue present medications. ?                          - Await pathology results. ?Jerene Bears, MD ?05/02/2022 10:11:01 AM ?This report has been signed electronically. ?

## 2022-05-02 NOTE — Progress Notes (Signed)
Called to room to assist during endoscopic procedure.  Patient ID and intended procedure confirmed with present staff. Received instructions for my participation in the procedure from the performing physician.  

## 2022-05-02 NOTE — Progress Notes (Signed)
? ?GASTROENTEROLOGY PROCEDURE H&P NOTE  ? ?Primary Care Physician: ?Burnis Medin, MD ? ? ? ?Reason for Procedure:  Atypical chest pain and question of GERD despite high dose PPI, colon cancer screening ? ?Plan:    EGD and colonoscopy ? ?Patient is appropriate for endoscopic procedure(s) in the ambulatory ([facility]) setting. ? ?The nature of the procedure, as well as the risks, benefits, and alternatives were carefully and thoroughly reviewed with the patient. Ample time for discussion and questions allowed. The patient understood, was satisfied, and agreed to proceed.  ? ? ? ?HPI: ?Crystal Mcintyre is a 46 y.o. female who presents for EGD and colonoscopy.  Medical history as below.  Tolerated the prep.  No recent chest pain or shortness of breath.  No abdominal pain today. ? ?Past Medical History:  ?Diagnosis Date  ? Gastric ulcer   ? GERD (gastroesophageal reflux disease)   ? GOITER, MULTINODULAR 04/18/2009  ? Headache(784.0) 06/17/2007  ? IRREGULAR MENSTRUATION 06/17/2007  ? Migraines   ? Other anxiety states 01/30/2010  ? Scoliosis   ? THYROID STIMULATING HORMONE, ABNORMAL 01/10/2009  ? TOBACCO USE 01/10/2008  ? TRANSAMINASES, SERUM, ELEVATED 01/10/2009  ? ? ?Past Surgical History:  ?Procedure Laterality Date  ? IR ANGIOGRAM PELVIS SELECTIVE OR SUPRASELECTIVE  10/28/2021  ? IR ANGIOGRAM PELVIS SELECTIVE OR SUPRASELECTIVE  10/28/2021  ? IR ANGIOGRAM SELECTIVE EACH ADDITIONAL VESSEL  10/28/2021  ? IR ANGIOGRAM SELECTIVE EACH ADDITIONAL VESSEL  10/28/2021  ? IR EMBO TUMOR ORGAN ISCHEMIA INFARCT INC GUIDE ROADMAPPING  10/28/2021  ? IR RADIOLOGIST EVAL & MGMT  07/24/2021  ? IR RADIOLOGIST EVAL & MGMT  11/28/2021  ? IR US GUIDE VASC ACCESS LEFT  10/28/2021  ? IR US GUIDE VASC ACCESS RIGHT  10/28/2021  ? MASS EXCISION Right 07/04/2021  ? Procedure: RIGHT THUMB EXCISION MUCOID CYST AND DEBRIDEMENT INTERPHALANGEAL JOINT ARTHRITIS;  Surgeon: Leanora Cover, MD;  Location: [facility];  Service: Orthopedics;   Laterality: Right;  ? TONSILLECTOMY  as child  ? TYMPANOPLASTY  4 yrs ago  ? TYMPANOSTOMY TUBE PLACEMENT  as child  ? UPPER GASTROINTESTINAL ENDOSCOPY    ? ? ?Prior to Admission medications   ?Medication Sig Start Date End Date Taking? Authorizing Provider  ?gabapentin (NEURONTIN) 100 MG capsule Take 2-3 capsules (200-300 mg total) by mouth at bedtime. 01/29/22  Yes Panosh, [name], MD  ?pantoprazole (PROTONIX) 40 MG tablet Take 1 tablet (40 mg total) by mouth 2 (two) times daily before a meal. 03/13/22  Yes Authur Cubit, Lajuan Lines, MD  ?sertraline (ZOLOFT) 50 MG tablet Take 50 mg by mouth daily.   Yes [provider]  ?Multiple Vitamin (MULTI VITAMIN DAILY PO) Take 2 tablets by mouth daily.    [provider]  ? ? ?Current Outpatient Medications  ?Medication Sig Dispense Refill  ? gabapentin (NEURONTIN) 100 MG capsule Take 2-3 capsules (200-300 mg total) by mouth at bedtime. 90 capsule 11  ? pantoprazole (PROTONIX) 40 MG tablet Take 1 tablet (40 mg total) by mouth 2 (two) times daily before a meal. 60 tablet 3  ? sertraline (ZOLOFT) 50 MG tablet Take 50 mg by mouth daily.    ? Multiple Vitamin (MULTI VITAMIN DAILY PO) Take 2 tablets by mouth daily.    ? ?Current Facility-Administered Medications  ?Medication Dose Route Frequency Provider Last Rate Last Admin  ? 0.9 %  sodium chloride infusion  500 mL Intravenous Once Brain Honeycutt, Lajuan Lines, MD      ? ? ?Allergies as  of 05/02/2022 - Review Complete 05/02/2022  ?Allergen Reaction Noted  ? Excedrin extra strength [asa-apap-caff buffered] Other (See Comments) 10/28/2021  ? ? ?Family History  ?Problem Relation Age of Onset  ? Colon polyps Mother   ? Pneumonia Father   ? Alcohol abuse Father   ? Breast cancer Paternal Aunt   ? Breast cancer Paternal Aunt   ? Diabetes Maternal Grandfather   ? Prostate cancer Paternal Grandfather   ? Rectal cancer Neg Hx   ? Stomach cancer Neg Hx   ? Colon cancer Neg Hx   ? Esophageal cancer Neg Hx   ? ? ?Social History  ? ?Socioeconomic  History  ? Marital status: Married  ?  Spouse name: Not on file  ? Number of children: Not on file  ? Years of education: Not on file  ? Highest education level: Not on file  ?Occupational History  ? Occupation: Intake Coordinator at [employer]  ?  Employer: [employer]  ?Tobacco Use  ? Smoking status: Former  ?  Types: Cigarettes  ?  Quit date: 12/29/2008  ?  Years since quitting: 13.3  ? Smokeless tobacco: Never  ?Vaping Use  ? Vaping Use: Never used  ?Substance and Sexual Activity  ? Alcohol use: Yes  ?  Comment: rare  ? Drug use: No  ? Sexual activity: Yes  ?  Partners: Male  ?  Birth control/protection: None  ?  Comment: last menstral cycle January 2023 - peri-menopausal per Pt.  ?Other Topics Concern  ? Not on file  ?Social History Narrative  ? HH of 2  ? Pet dog  ? 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  ? 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  ?   ? ?Social Determinants of Health  ? ?Financial Resource Strain: Not on file  ?Food Insecurity: Not on file  ?Transportation Needs: Not on file  ?Physical Activity: Not on file  ?Stress: Not on file  ?Social Connections: Not on file  ?Intimate Partner Violence: Not on file  ? ? ?Physical Exam: ?Vital signs in last 24 hours: ?'@Temp'$  98.7 ?F (37.1 ?C)  ?GEN: NAD ?EYE: Sclerae anicteric ?ENT: MMM ?CV: Non-tachycardic ?Pulm: CTA b/l ?GI: Soft, NT/ND ?NEURO:  Alert & Oriented x 3 ? ? ?Zenovia Jarred, MD ?[facility] ? ?05/02/2022 9:05 AM ? ?

## 2022-05-06 ENCOUNTER — Telehealth: Payer: Self-pay

## 2022-05-06 NOTE — Telephone Encounter (Signed)
Mailbox full

## 2022-05-06 NOTE — Telephone Encounter (Signed)
Patient called back and said everything was fine with her following her procedure.  If she has any issues, she will give the office a call.  Thank you. ?

## 2022-05-06 NOTE — Telephone Encounter (Signed)
Unable to leave message mailbox full.

## 2022-05-06 NOTE — Telephone Encounter (Signed)
Voicemail full and unable to leave message on follow up call. ?

## 2022-05-07 ENCOUNTER — Encounter: Payer: Self-pay | Admitting: Internal Medicine

## 2022-05-15 ENCOUNTER — Ambulatory Visit (HOSPITAL_COMMUNITY)
Admission: RE | Admit: 2022-05-15 | Discharge: 2022-05-15 | Disposition: A | Discharge status: Home / Self Care | Payer: Commercial Managed Care - PPO | Source: Ambulatory Visit | Attending: Interventional Radiology | Admitting: Interventional Radiology

## 2022-05-15 DIAGNOSIS — D259 Leiomyoma of uterus, unspecified: Secondary | ICD-10-CM | POA: Insufficient documentation

## 2022-05-15 MED ORDER — GADOBUTROL 1 MMOL/ML IV SOLN
7.0000 mL | Freq: Once | INTRAVENOUS | Status: AC | PRN
  Administered 2022-05-15: 7 mL via INTRAVENOUS

## 2022-05-22 ENCOUNTER — Ambulatory Visit
Admission: RE | Admit: 2022-05-22 | Discharge: 2022-05-22 | Disposition: A | Discharge status: Home / Self Care | Payer: Commercial Managed Care - PPO | Source: Ambulatory Visit | Attending: Interventional Radiology | Admitting: Interventional Radiology

## 2022-05-22 ENCOUNTER — Encounter: Payer: Self-pay | Admitting: *Deleted

## 2022-05-22 DIAGNOSIS — D259 Leiomyoma of uterus, unspecified: Secondary | ICD-10-CM

## 2022-05-22 HISTORY — PX: IR RADIOLOGIST EVAL & MGMT: IMG5224

## 2022-05-22 NOTE — Progress Notes (Signed)
Patient ID: Crystal Mcintyre, female   DOB: Aug 03, 1976, 46 y.o.   MRN: [id number]       Chief Complaint:  Uterine fibroids  Referring Physician(s):  Dr. Garwin Brothers  History of Present Illness: Crystal Mcintyre is a 46 y.o. female who is now 86-monthstatus post uterine fibroid embolization.  [facility]  Over the last 6 months she has continued to recover very well.  Very minor intermittent lower abdominal and pelvic pain.  This continues to improve it.  She takes occasional Advil as needed.  No recent illness or fevers.  She has not had a period since January 2023.  She does have menopausal symptoms.  She is back to work.  No physical limitations.  Overall she is doing very well and pleased with the outcome at 6 months.  Posttreatment MRI recently performed confirming overall fibroid uterine volume reduction of greater than 50%.  All the fibroids are smaller and are avascular.  No residual fibroid enhancement or suspicious finding.    Past Medical History:  Diagnosis Date   Gastric ulcer    GERD (gastroesophageal reflux disease)    GOITER, MULTINODULAR 04/18/2009   Headache(784.0) 06/17/2007   IRREGULAR MENSTRUATION 06/17/2007   Migraines    Other anxiety states 01/30/2010   Scoliosis    THYROID STIMULATING HORMONE, ABNORMAL 01/10/2009   TOBACCO USE 01/10/2008   TRANSAMINASES, SERUM, ELEVATED 01/10/2009    Past Surgical History:  Procedure Laterality Date   IR ANGIOGRAM PELVIS SELECTIVE OR SUPRASELECTIVE  10/28/2021   IR ANGIOGRAM PELVIS SELECTIVE OR SUPRASELECTIVE  10/28/2021   IR ANGIOGRAM SELECTIVE EACH ADDITIONAL VESSEL  10/28/2021   IR ANGIOGRAM SELECTIVE EACH ADDITIONAL VESSEL  10/28/2021   IR EMBO TUMOR ORGAN ISCHEMIA INFARCT INC GUIDE ROADMAPPING  10/28/2021   IR RADIOLOGIST EVAL & MGMT  07/24/2021   IR RADIOLOGIST EVAL & MGMT  11/28/2021   IR [place] ACCESS LEFT  10/28/2021   IR [place] V[place] ACCESS RIGHT   10/28/2021   MASS EXCISION Right 07/04/2021   Procedure: RIGHT THUMB EXCISION MUCOID CYST AND DEBRIDEMENT INTERPHALANGEAL JOINT ARTHRITIS;  Surgeon: KLeanora Cover MD;  [facility]  Service: Orthopedics;  Laterality: Right;   TONSILLECTOMY  as child   TYMPANOPLASTY  4 yrs ago   TYMPANOSTOMY TUBE PLACEMENT  as child   UPPER GASTROINTESTINAL ENDOSCOPY      Allergies: Excedrin extra strength [asa-apap-caff buffered]  Medications: Prior to Admission medications   Medication Sig Start Date End Date Taking? Authorizing Provider  gabapentin (NEURONTIN) 100 MG capsule Take 2-3 capsules (200-300 mg total) by mouth at bedtime. 01/29/22   Panosh, WStandley Brooking MD  Multiple Vitamin (MULTI VITAMIN DAILY PO) Take 2 tablets by mouth daily.    [provider]  pantoprazole (PROTONIX) 40 MG tablet Take 1 tablet (40 mg total) by mouth 2 (two) times daily before a meal. 03/13/22   Pyrtle, JLajuan Lines MD  sertraline (ZOLOFT) 50 MG tablet Take 50 mg by mouth daily.    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      Social Determinants of Health   Financial Resource Strain: Not on file  Food Insecurity: Not on file  Transportation Needs: Not on file  Physical Activity: Not on file  Stress: Not on file  Social Connections: Not on file    Review of Systems  Review of Systems: A 12 point ROS discussed and pertinent positives are indicated in the HPI above.  All other systems are negative.  Physical Exam No direct physical exam was performed telephone health visit only today Vital Signs: There were no vitals taken for this visit.  Imaging: MR PELVIS W WO CONTRAST  Result Date: 05/15/2022 CLINICAL DATA:  Follow-up uterine fibroids. Approximately 6 months status post uterine artery embolization. EXAM: MRI PELVIS WITHOUT AND WITH CONTRAST TECHNIQUE: Multiplanar multisequence MR imaging of the pelvis was performed both before and after administration of intravenous contrast. CONTRAST:  45m GADAVIST GADOBUTROL 1 MMOL/ML IV SOLN COMPARISON:  08/03/2021 FINDINGS: Lower Urinary Tract: No bladder or urethral abnormality identified. Bowel:  Unremarkable visualized pelvic bowel loops. Vascular/Lymphatic: No pathologically enlarged lymph nodes or other significant abnormality. Reproductive: -- Uterus: Measures 12.2 x 10.2 by 9.6 cm (volume = 630 cm^3, significantly decreased from 1200 cm3 on prior exam). Nearly all fibroids show interval decrease in size since prior study. Largest in the fundus currently measures 7.4 x 7.0 cm, compared to 8.2 x 7.6 cm previously. -- Fibroid contrast enhancement: All fibroids show lack of residual contrast enhancement on  today's exam. -- Right ovary:  Appears normal.  No mass identified. -- Left ovary:  Appears normal.  No mass identified. Other: No abnormal free fluid. Musculoskeletal:  Unremarkable. IMPRESSION: Significantly decreased overall uterine volume and size of individual fibroids since prior study. Fibroids show no residual contrast enhancement on today's exam. No adnexal mass or other acute findings identified. Electronically Signed   By: JMarlaine HindM.D.   On: 05/15/2022 16:51    Labs:  CBC: Recent Labs    10/28/21 0740  WBC 4.9  HGB 10.4*  HCT 35.5*  PLT 333    COAGS: Recent Labs    10/28/21 0740  INR 0.9    BMP: Recent Labs    10/28/21 0740  NA 136  K 3.6  CL 105  CO2 25  GLUCOSE 90  BUN  15  CALCIUM 9.2  CREATININE 0.51  GFRNONAA >60    LIVER FUNCTION TESTS: No results for input(s): BILITOT, AST, ALT, ALKPHOS, PROT, ALBUMIN in the last 8760 hours.   Assessment and Plan:  71-monthstatus post uterine fibroid embolization.  Clinically she has continued to do very well following the procedure.  She has not had a period since January 2023 which can be seen following embolization therapy.  No other significant fibroid symptoms.  MRI findings are concordant with her clinical outcome as detailed above.  She is pleased with both the clinical outcome and the MRI results.  All questions addressed.  Plan: Continue annual GYN follow-up with Dr. CGarwin Brothers  Follow-up with interventional as needed.   Electronically Signed: MGreggory Keen5/25/2023, 10:55 AM   I spent a total of    25 Minutes in remote  clinical consultation, greater than 50% of which was counseling/coordinating care for this patient status post fibroid embolization.    Visit type: Audio only (telephone). Audio (no video) only due to patient's lack of internet/smartphone capability. [facility] [address] This visit type was conducted  due to national recommendations for restrictions regarding the COVID-19 Pandemic (e.g. social distancing).  This format is felt to be most appropriate for this patient at this time.  All issues noted in this document were discussed and addressed.

## 2022-07-05 ENCOUNTER — Other Ambulatory Visit: Payer: Self-pay | Admitting: Internal Medicine

## 2022-07-11 ENCOUNTER — Emergency Department (HOSPITAL_COMMUNITY): Payer: Commercial Managed Care - PPO

## 2022-07-11 ENCOUNTER — Encounter (HOSPITAL_COMMUNITY): Payer: Self-pay

## 2022-07-11 ENCOUNTER — Emergency Department (HOSPITAL_COMMUNITY)
Admission: EM | Admit: 2022-07-11 | Discharge: 2022-07-12 | Disposition: A | Discharge status: Home / Self Care | Payer: Commercial Managed Care - PPO | Attending: Emergency Medicine | Admitting: Emergency Medicine

## 2022-07-11 ENCOUNTER — Other Ambulatory Visit: Payer: Self-pay

## 2022-07-11 DIAGNOSIS — Z79899 Other long term (current) drug therapy: Secondary | ICD-10-CM | POA: Diagnosis not present

## 2022-07-11 DIAGNOSIS — R0602 Shortness of breath: Secondary | ICD-10-CM | POA: Diagnosis not present

## 2022-07-11 DIAGNOSIS — M25512 Pain in left shoulder: Secondary | ICD-10-CM | POA: Diagnosis present

## 2022-07-11 DIAGNOSIS — D72829 Elevated white blood cell count, unspecified: Secondary | ICD-10-CM | POA: Diagnosis not present

## 2022-07-11 DIAGNOSIS — I10 Essential (primary) hypertension: Secondary | ICD-10-CM | POA: Diagnosis not present

## 2022-07-11 NOTE — ED Triage Notes (Signed)
Pt. BIB [facility] c/o L. Shoulder pain since 18:30. Pt. States that the pain got worse so she took 4 bright orange pills that were '200mg'$  a piece with no relief. She denies injury to the shoulder, chest pain, and NV.

## 2022-07-12 ENCOUNTER — Emergency Department (HOSPITAL_COMMUNITY): Payer: Commercial Managed Care - PPO

## 2022-07-12 LAB — TROPONIN I (HIGH SENSITIVITY)
Troponin I (High Sensitivity): 3 ng/L (ref <18)
Troponin I (High Sensitivity): 4 ng/L (ref <18)

## 2022-07-12 LAB — CBC WITH DIFFERENTIAL/PLATELET
Abs Immature Granulocytes: 0.04 10*3/uL (ref 0.00–0.07)
Basophils Absolute: 0.1 10*3/uL (ref 0.0–0.1)
Basophils Relative: 1 %
Eosinophils Absolute: 0 10*3/uL (ref 0.0–0.5)
Eosinophils Relative: 0 %
HCT: 32.1 % — ABNORMAL LOW (ref 36.0–46.0)
Hemoglobin: 9.7 g/dL — ABNORMAL LOW (ref 12.0–15.0)
Immature Granulocytes: 0 %
Lymphocytes Relative: 9 %
Lymphs Abs: 1 10*3/uL (ref 0.7–4.0)
MCH: 24.1 pg — ABNORMAL LOW (ref 26.0–34.0)
MCHC: 30.2 g/dL (ref 30.0–36.0)
MCV: 79.9 fL — ABNORMAL LOW (ref 80.0–100.0)
Monocytes Absolute: 0.7 10*3/uL (ref 0.1–1.0)
Monocytes Relative: 7 %
Neutro Abs: 8.7 10*3/uL — ABNORMAL HIGH (ref 1.7–7.7)
Neutrophils Relative %: 83 %
Platelets: 367 10*3/uL (ref 150–400)
RBC: 4.02 MIL/uL (ref 3.87–5.11)
RDW: 15.7 % — ABNORMAL HIGH (ref 11.5–15.5)
WBC: 10.6 10*3/uL — ABNORMAL HIGH (ref 4.0–10.5)
nRBC: 0 % (ref 0.0–0.2)

## 2022-07-12 LAB — BASIC METABOLIC PANEL
Anion gap: 8 (ref 5–15)
BUN: 12 mg/dL (ref 6–20)
CO2: 27 mmol/L (ref 22–32)
Calcium: 9.6 mg/dL (ref 8.9–10.3)
Chloride: 106 mmol/L (ref 98–111)
Creatinine, Ser: 0.66 mg/dL (ref 0.44–1.00)
GFR, Estimated: >60 mL/min (ref >60)
Glucose, Bld: 98 mg/dL (ref 70–99)
Potassium: 3.9 mmol/L (ref 3.5–5.1)
Sodium: 141 mmol/L (ref 135–145)

## 2022-07-12 LAB — I-STAT BETA HCG BLOOD, ED (MC, WL, AP ONLY): I-stat hCG, quantitative: <5 m[IU]/mL (ref <5)

## 2022-07-12 MED ORDER — HYDROMORPHONE HCL 1 MG/ML IJ SOLN
0.5000 mg | Freq: Once | INTRAMUSCULAR | Status: AC
  Administered 2022-07-12: 0.5 mg via INTRAVENOUS
  Filled 2022-07-12: qty 1

## 2022-07-12 MED ORDER — ONDANSETRON HCL 4 MG/2ML IJ SOLN
4.0000 mg | Freq: Once | INTRAMUSCULAR | Status: AC
  Administered 2022-07-12: 4 mg via INTRAVENOUS
  Filled 2022-07-12: qty 2

## 2022-07-12 MED ORDER — KETOROLAC TROMETHAMINE 15 MG/ML IJ SOLN
15.0000 mg | Freq: Once | INTRAMUSCULAR | Status: AC
  Administered 2022-07-12: 15 mg via INTRAVENOUS
  Filled 2022-07-12: qty 1

## 2022-07-12 MED ORDER — LIDOCAINE 5 % EX PTCH
1.0000 | MEDICATED_PATCH | CUTANEOUS | Status: DC
  Administered 2022-07-12: 1 via TRANSDERMAL
  Filled 2022-07-12: qty 1

## 2022-07-12 MED ORDER — FENTANYL CITRATE PF 50 MCG/ML IJ SOSY
100.0000 ug | PREFILLED_SYRINGE | Freq: Once | INTRAMUSCULAR | Status: AC
  Administered 2022-07-12: 100 ug via INTRAVENOUS
  Filled 2022-07-12: qty 2

## 2022-07-12 MED ORDER — METHOCARBAMOL 500 MG PO TABS
500.0000 mg | ORAL_TABLET | Freq: Two times a day (BID) | ORAL | 0 refills | Status: DC
Start: 2022-07-12 — End: 2022-08-29

## 2022-07-12 MED ORDER — FENTANYL CITRATE PF 50 MCG/ML IJ SOSY
50.0000 ug | PREFILLED_SYRINGE | Freq: Once | INTRAMUSCULAR | Status: AC
  Administered 2022-07-12: 50 ug via INTRAVENOUS
  Filled 2022-07-12: qty 1

## 2022-07-12 NOTE — Discharge Instructions (Signed)
You were seen in the emergency department today for your arm pain.  Your physical exam and vital signs are very reassuring, as were your blood tests, EKG, and chest xray.  The muscles in your shoulder are in what is called spasm, meaning they are inappropriately tightened up.  This can be quite painful.  To help with your pain you may take Tylenol and / or NSAID medication (such as ibuprofen or naproxen) to help with your pain.  Additionally, you have been prescribed a muscle relaxer called Robaxin to help relieve some of the muscle spasm.  Please be advised that this medication may make you very sleepy, so you should not drive or operate heavy machinery while you are taking it.  You may also utilize topical pain relief such as Biofreeze, IcyHot, or topical lidocaine patches.  I also recommend that you apply heat to the area, such as a hot shower or heating pad, and follow heat application with massage of the muscles that are most tight.  Please return to the emergency department if you develop any numbness/tingling/weakness in your arms or legs, any difficulty urinating, or urinary incontinence chest pain, shortness of breath, abdominal pain, nausea or vomiting that does not stop, or any other new severe symptoms.

## 2022-07-12 NOTE — ED Provider Notes (Signed)
[facility] DEPT Provider Note   CSN: [id number] Arrival date & time: 07/11/22  2217     History  Chief Complaint  Patient presents with   Shoulder Pain    Crystal Mcintyre is a 46 y.o. female who presents via EMS with concern for sudden onset left-sided shoulder pain that radiated down her entire arm was significantly exacerbated with movement that started around 630 this evening.  She states she took for "bright orange-colored pills that were 200 mg each", suspect ibuprofen without relief.  Denies any chest pain, shortness of breath, recent injury or new activity.  Denies any nausea or vomiting.  Denies any history of cardiac abnormality.    No history of same.  I personally reviewed her medical records.  She has history of goiter, hyperthyroidism, migraines.  He is on gabapentin daily for menopausal symptoms.  Denies any other daily medications.  No recent travel/prolonged mobilization, history of malignancy or DVT.  No hormone replacement therapy.HPI     Home Medications Prior to Admission medications   Medication Sig Start Date End Date Taking? Authorizing Provider  gabapentin (NEURONTIN) 100 MG capsule Take 2-3 capsules (200-300 mg total) by mouth at bedtime. 01/29/22  Yes Panosh, [name], MD  methocarbamol (ROBAXIN) 500 MG tablet Take 1 tablet (500 mg total) by mouth 2 (two) times daily. 07/12/22  Yes [place], Gypsy Balsam, PA-C  Multiple Vitamin (MULTI VITAMIN DAILY PO) Take 2 tablets by mouth daily.   Yes [provider]  pantoprazole (PROTONIX) 40 MG tablet TAKE 1 TABLET (40 MG TOTAL) BY MOUTH TWICE A DAY BEFORE MEALS Patient taking differently: Take 40 mg by mouth 2 (two) times daily before a meal. 07/07/22  Yes Pyrtle, Lajuan Lines, MD      Allergies    Excedrin extra strength [asa-apap-caff buffered]    Review of Systems   Review of Systems  Musculoskeletal:        Left arm pain  All other systems reviewed and are  negative.   Physical Exam Updated Vital Signs BP 128/86   Pulse 85   Temp 98.5 F (36.9 C) (Oral)   Resp (!) 25   Ht '5\' 4"'$  (1.626 m)   Wt 76.7 kg   SpO2 99%   BMI 29.01 kg/m  Physical Exam Vitals and nursing note reviewed.  Constitutional:      Appearance: She is not ill-appearing or toxic-appearing.  HENT:     Head: Normocephalic and atraumatic.     Mouth/Throat:     Mouth: Mucous membranes are moist.     Pharynx: No oropharyngeal exudate or posterior oropharyngeal erythema.  Eyes:     General:        Right eye: No discharge.        Left eye: No discharge.     Extraocular Movements: Extraocular movements intact.     Conjunctiva/sclera: Conjunctivae normal.     Pupils: Pupils are equal, round, and reactive to light.  Cardiovascular:     Rate and Rhythm: Normal rate and regular rhythm.     Pulses: Normal pulses.     Heart sounds: Normal heart sounds.  Pulmonary:     Effort: Pulmonary effort is normal. No respiratory distress.     Breath sounds: Normal breath sounds. No wheezing or rales.  Abdominal:     General: Bowel sounds are normal. There is no distension.     Palpations: Abdomen is soft.     Tenderness: There is no abdominal tenderness. There is  no guarding.  Musculoskeletal:        General: No deformity.     Right shoulder: Normal.     Left shoulder: Tenderness present. No swelling, deformity, bony tenderness or crepitus. Decreased range of motion. Normal strength.     Right upper arm: Normal.     Left upper arm: Tenderness present. No lacerations or bony tenderness.     Right elbow: Normal.     Left elbow: Normal.     Right forearm: Normal.     Left forearm: Normal.     Right wrist: Normal.     Left wrist: Normal.     Right hand: Normal.     Left hand: Normal.     Cervical back: Neck supple.     Right lower leg: No edema.     Comments: Exquisite tenderness palpation over the top and posterior aspects of the left shoulder without erythema, skin changes,  bruising, deformity.  Decreased range of motion secondary to patient's pain, unwilling to range the elbow for me at this time.  Skin:    General: Skin is warm and dry.     Capillary Refill: Capillary refill takes less than 2 seconds.  Neurological:     General: No focal deficit present.     Mental Status: She is alert and oriented to person, place, and time. Mental status is at baseline.  Psychiatric:        Mood and Affect: Mood normal.     ED Results / Procedures / Treatments   Labs (all labs ordered are listed, but only abnormal results are displayed) Labs Reviewed  CBC WITH DIFFERENTIAL/PLATELET - Abnormal; Notable for the following components:      Result Value   WBC 10.6 (*)    Hemoglobin 9.7 (*)    HCT 32.1 (*)    MCV 79.9 (*)    MCH 24.1 (*)    RDW 15.7 (*)    Neutro Abs 8.7 (*)    All other components within normal limits  BASIC METABOLIC PANEL  I-STAT BETA HCG BLOOD, ED ([facility], [facility], [facility] ONLY)  TROPONIN I (HIGH SENSITIVITY)  TROPONIN I (HIGH SENSITIVITY)    EKG EKG Interpretation  Date/Time:  Saturday July 12 2022 00:40:50 EDT Ventricular Rate:  98 PR Interval:  158 QRS Duration: 79 QT Interval:  342 QTC Calculation: 437 R Axis:   -2 Text Interpretation: Sinus rhythm Left atrial enlargement Rate is faster Confirmed by Shanon Rosser [phone]) on 07/12/2022 12:49:43 AM  Radiology DG Chest 2 View  Result Date: 07/12/2022 CLINICAL DATA:  Left shoulder pain EXAM: CHEST - 2 VIEW COMPARISON:  None Available. FINDINGS: Lungs are well expanded, symmetric, and clear. No pneumothorax or pleural effusion. Cardiac size within normal limits. Pulmonary vascularity is normal. Osseous structures are age-appropriate. Mild thoracic dextroscoliosis. No acute bone abnormality. IMPRESSION: No active cardiopulmonary disease. Electronically Signed   By: Fidela Salisbury M.D.   On: 07/12/2022 01:09   DG Shoulder Left  Result Date: 07/11/2022 CLINICAL DATA:  Left shoulder pain EXAM: LEFT  SHOULDER - 2+ VIEW COMPARISON:  None Available. FINDINGS: There is no evidence of fracture or dislocation. There is no evidence of arthropathy or other focal bone abnormality. Soft tissues are unremarkable. IMPRESSION: Negative. Electronically Signed   By: Fidela Salisbury M.D.   On: 07/11/2022 23:26    Procedures Procedures    Medications Ordered in ED Medications  lidocaine (LIDODERM) 5 % 1 patch (1 patch Transdermal Patch Applied 07/12/22 0408)  fentaNYL (SUBLIMAZE)  injection 50 mcg (50 mcg Intravenous Given 07/12/22 0051)  fentaNYL (SUBLIMAZE) injection 100 mcg (100 mcg Intravenous Given 07/12/22 0301)  HYDROmorphone (DILAUDID) injection 0.5 mg (0.5 mg Intravenous Given 07/12/22 0413)  ondansetron (ZOFRAN) injection 4 mg (4 mg Intravenous Given 07/12/22 0410)  ketorolac (TORADOL) 15 MG/ML injection 15 mg (15 mg Intravenous Given 07/12/22 0414)    ED Course/ Medical Decision Making/ A&P                           Medical Decision Making 46 year old female presents with concern for sudden onset severe left shoulder pain and shortness of breath.  No palpitations.  Tachycardic and hypertensive on intake. Vital signs otherwise normal.  Cardiopulmonary and abdominal exams are benign at time of my evaluation.  Neurovascular intact in all extremities including the left upper extremity, with normal capillary refill and fine motor movement in the hand.  Patient refusing to range the shoulder at time of my initial evaluation secondary to pain, however able to nearly fully actively range the shoulder though with some discomfort after IV pain medication.   Amount and/or Complexity of Data Reviewed Labs: ordered.    Details: CBC with my leukocytosis of 10,000, anemia at patient's baseline of 9.  BMP unremarkable, troponin is normal, patient is not pregnant. Radiology: ordered.    Details: Chest x-ray visualized by this provider is negative for acute cardiopulmonary disease as his left shoulder x-ray  normal. ECG/medicine tests: independent interpretation performed.    Details: EKG normal sinus rhythm without STEMI or changes in intervals.  Risk Prescription drug management.    No further work-up warranted near this time.  Clinical concern for emergent etiology of this patient symptoms remains low.  Suspect muscle spasm as etiology for pain.  Will discharge with Robaxin and recommendation for topical analgesia.  Recommend outpatient follow-up as well.   Jasmaine voiced understanding of her medical evaluation and treatment plan. Each of their questions answered to their expressed satisfaction.  Return precautions were given.  Patient is well-appearing, stable, and was discharged in good condition.  This chart was dictated using voice recognition software, Dragon. Despite the best efforts of this provider to proofread and correct errors, errors may still occur which can change documentation meaning.    Final Clinical Impression(s) / ED Diagnoses Final diagnoses:  Acute pain of left shoulder    Rx / DC Orders ED Discharge Orders          Ordered    methocarbamol (ROBAXIN) 500 MG tablet  2 times daily        07/12/22 0547              Ralf [place], Gypsy Balsam, PA-C 07/12/22 0551    Molpus, Jenny Reichmann, MD 07/12/22 [phone]

## 2022-07-25 ENCOUNTER — Encounter: Payer: Self-pay | Admitting: Internal Medicine

## 2022-08-08 ENCOUNTER — Other Ambulatory Visit: Payer: Self-pay | Admitting: Orthopedic Surgery

## 2022-08-29 ENCOUNTER — Encounter (HOSPITAL_BASED_OUTPATIENT_CLINIC_OR_DEPARTMENT_OTHER): Payer: Self-pay | Admitting: Orthopedic Surgery

## 2022-08-29 ENCOUNTER — Other Ambulatory Visit: Payer: Self-pay

## 2022-09-08 ENCOUNTER — Other Ambulatory Visit: Payer: Self-pay

## 2022-09-08 ENCOUNTER — Ambulatory Visit (HOSPITAL_BASED_OUTPATIENT_CLINIC_OR_DEPARTMENT_OTHER): Payer: Commercial Managed Care - PPO | Admitting: Anesthesiology

## 2022-09-08 ENCOUNTER — Encounter (HOSPITAL_BASED_OUTPATIENT_CLINIC_OR_DEPARTMENT_OTHER): Payer: Self-pay | Admitting: Orthopedic Surgery

## 2022-09-08 ENCOUNTER — Ambulatory Visit (HOSPITAL_BASED_OUTPATIENT_CLINIC_OR_DEPARTMENT_OTHER)
Admission: RE | Admit: 2022-09-08 | Discharge: 2022-09-08 | Disposition: A | Discharge status: Home / Self Care | Payer: Commercial Managed Care - PPO | Attending: Orthopedic Surgery | Admitting: Orthopedic Surgery

## 2022-09-08 ENCOUNTER — Encounter (HOSPITAL_BASED_OUTPATIENT_CLINIC_OR_DEPARTMENT_OTHER)
Admission: RE | Disposition: A | Discharge status: Home / Self Care | Payer: Self-pay | Source: Home / Self Care | Attending: Orthopedic Surgery

## 2022-09-08 DIAGNOSIS — K219 Gastro-esophageal reflux disease without esophagitis: Secondary | ICD-10-CM | POA: Insufficient documentation

## 2022-09-08 DIAGNOSIS — G5601 Carpal tunnel syndrome, right upper limb: Secondary | ICD-10-CM

## 2022-09-08 DIAGNOSIS — N926 Irregular menstruation, unspecified: Secondary | ICD-10-CM

## 2022-09-08 DIAGNOSIS — Z87891 Personal history of nicotine dependence: Secondary | ICD-10-CM | POA: Diagnosis not present

## 2022-09-08 DIAGNOSIS — Z01818 Encounter for other preprocedural examination: Secondary | ICD-10-CM

## 2022-09-08 HISTORY — PX: CARPAL TUNNEL RELEASE: SHX101

## 2022-09-08 LAB — POCT PREGNANCY, URINE: Preg Test, Ur: NEGATIVE

## 2022-09-08 SURGERY — CARPAL TUNNEL RELEASE
Anesthesia: General | Site: Wrist | Laterality: Right

## 2022-09-08 MED ORDER — FENTANYL CITRATE (PF) 100 MCG/2ML IJ SOLN
INTRAMUSCULAR | Status: AC
  Filled 2022-09-08: qty 2

## 2022-09-08 MED ORDER — ACETAMINOPHEN 500 MG PO TABS
1000.0000 mg | ORAL_TABLET | Freq: Once | ORAL | Status: AC
Start: 2022-09-08 — End: 2022-09-08
  Administered 2022-09-08: 1000 mg via ORAL

## 2022-09-08 MED ORDER — FENTANYL CITRATE (PF) 100 MCG/2ML IJ SOLN: 25.0000 ug | INTRAMUSCULAR | Status: DC | PRN

## 2022-09-08 MED ORDER — FENTANYL CITRATE (PF) 100 MCG/2ML IJ SOLN
INTRAMUSCULAR | Status: DC | PRN
  Administered 2022-09-08: 50 ug via INTRAVENOUS

## 2022-09-08 MED ORDER — MIDAZOLAM HCL 5 MG/5ML IJ SOLN
INTRAMUSCULAR | Status: DC | PRN
  Administered 2022-09-08: 2 mg via INTRAVENOUS

## 2022-09-08 MED ORDER — PROPOFOL 10 MG/ML IV BOLUS
INTRAVENOUS | Status: DC | PRN
  Administered 2022-09-08: 200 mg via INTRAVENOUS

## 2022-09-08 MED ORDER — ACETAMINOPHEN 500 MG PO TABS
ORAL_TABLET | ORAL | Status: AC
  Filled 2022-09-08: qty 2

## 2022-09-08 MED ORDER — VANCOMYCIN HCL IN DEXTROSE 1-5 GM/200ML-% IV SOLN
INTRAVENOUS | Status: AC
  Filled 2022-09-08: qty 200

## 2022-09-08 MED ORDER — ONDANSETRON HCL 4 MG/2ML IJ SOLN
INTRAMUSCULAR | Status: AC
  Filled 2022-09-08: qty 2

## 2022-09-08 MED ORDER — MIDAZOLAM HCL 2 MG/2ML IJ SOLN
INTRAMUSCULAR | Status: AC
  Filled 2022-09-08: qty 2

## 2022-09-08 MED ORDER — LACTATED RINGERS IV SOLN: INTRAVENOUS | Status: DC

## 2022-09-08 MED ORDER — VANCOMYCIN HCL IN DEXTROSE 1-5 GM/200ML-% IV SOLN
1000.0000 mg | INTRAVENOUS | Status: AC
  Administered 2022-09-08: 1000 mg via INTRAVENOUS

## 2022-09-08 MED ORDER — HYDROCODONE-ACETAMINOPHEN 5-325 MG PO TABS: ORAL_TABLET | ORAL | 0 refills | Status: DC

## 2022-09-08 MED ORDER — PROPOFOL 10 MG/ML IV BOLUS
INTRAVENOUS | Status: AC
  Filled 2022-09-08: qty 20

## 2022-09-08 MED ORDER — BUPIVACAINE HCL (PF) 0.25 % IJ SOLN
INTRAMUSCULAR | Status: DC | PRN
  Administered 2022-09-08: 9 mL

## 2022-09-08 MED ORDER — ONDANSETRON HCL 4 MG/2ML IJ SOLN: 4.0000 mg | Freq: Once | INTRAMUSCULAR | Status: DC | PRN

## 2022-09-08 MED ORDER — LIDOCAINE 2% (20 MG/ML) 5 ML SYRINGE
INTRAMUSCULAR | Status: AC
  Filled 2022-09-08: qty 5

## 2022-09-08 MED ORDER — PROPOFOL 500 MG/50ML IV EMUL
INTRAVENOUS | Status: AC
  Filled 2022-09-08: qty 50

## 2022-09-08 MED ORDER — PROPOFOL 500 MG/50ML IV EMUL
INTRAVENOUS | Status: DC | PRN
  Administered 2022-09-08: 200 ug/kg/min via INTRAVENOUS

## 2022-09-08 MED ORDER — ONDANSETRON HCL 4 MG/2ML IJ SOLN
INTRAMUSCULAR | Status: DC | PRN
  Administered 2022-09-08: 4 mg via INTRAVENOUS

## 2022-09-08 SURGICAL SUPPLY — 34 items
APL PRP STRL LF DISP 70% ISPRP (MISCELLANEOUS) ×1
BLADE SURG 15 STRL LF DISP TIS (BLADE) ×2 IMPLANT
BLADE SURG 15 STRL SS (BLADE) ×2
BNDG CMPR 9X4 STRL LF SNTH (GAUZE/BANDAGES/DRESSINGS)
BNDG ELASTIC 3X5.8 VLCR STR LF (GAUZE/BANDAGES/DRESSINGS) ×1 IMPLANT
BNDG ESMARK 4X9 LF (GAUZE/BANDAGES/DRESSINGS) IMPLANT
BNDG GAUZE DERMACEA FLUFF 4 (GAUZE/BANDAGES/DRESSINGS) ×1 IMPLANT
BNDG GZE DERMACEA 4 6PLY (GAUZE/BANDAGES/DRESSINGS) ×1
CHLORAPREP W/TINT 26 (MISCELLANEOUS) ×1 IMPLANT
CORD BIPOLAR FORCEPS 12FT (ELECTRODE) ×1 IMPLANT
COVER BACK TABLE 60X90IN (DRAPES) ×1 IMPLANT
COVER MAYO STAND STRL (DRAPES) ×1 IMPLANT
CUFF TOURN SGL QUICK 18X4 (TOURNIQUET CUFF) ×1 IMPLANT
DRAPE EXTREMITY T 121X128X90 (DISPOSABLE) ×1 IMPLANT
DRAPE SURG 17X23 STRL (DRAPES) ×1 IMPLANT
GAUZE PAD ABD 8X10 STRL (GAUZE/BANDAGES/DRESSINGS) ×1 IMPLANT
GAUZE SPONGE 4X4 12PLY STRL (GAUZE/BANDAGES/DRESSINGS) ×1 IMPLANT
GAUZE XEROFORM 1X8 LF (GAUZE/BANDAGES/DRESSINGS) ×1 IMPLANT
GLOVE BIO SURGEON STRL SZ7.5 (GLOVE) ×1 IMPLANT
GLOVE BIOGEL PI IND STRL 8 (GLOVE) ×1 IMPLANT
GOWN STRL REUS W/ TWL LRG LVL3 (GOWN DISPOSABLE) ×1 IMPLANT
GOWN STRL REUS W/TWL LRG LVL3 (GOWN DISPOSABLE) ×1
GOWN STRL REUS W/TWL XL LVL3 (GOWN DISPOSABLE) ×1 IMPLANT
NDL HYPO 25X1 1.5 SAFETY (NEEDLE) ×1 IMPLANT
NEEDLE HYPO 25X1 1.5 SAFETY (NEEDLE) ×1 IMPLANT
NS IRRIG 1000ML POUR BTL (IV SOLUTION) ×1 IMPLANT
PACK BASIN DAY SURGERY FS (CUSTOM PROCEDURE TRAY) ×1 IMPLANT
PADDING CAST ABS COTTON 4X4 ST (CAST SUPPLIES) ×1 IMPLANT
STOCKINETTE 4X48 STRL (DRAPES) ×1 IMPLANT
SUT ETHILON 4 0 PS 2 18 (SUTURE) ×1 IMPLANT
SYR BULB EAR ULCER 3OZ GRN STR (SYRINGE) ×1 IMPLANT
SYR CONTROL 10ML LL (SYRINGE) ×1 IMPLANT
TOWEL GREEN STERILE FF (TOWEL DISPOSABLE) ×2 IMPLANT
UNDERPAD 30X36 HEAVY ABSORB (UNDERPADS AND DIAPERS) ×1 IMPLANT

## 2022-09-08 NOTE — Anesthesia Postprocedure Evaluation (Signed)
Anesthesia Post Note  Patient: Crystal Mcintyre  Procedure(s) Performed: RIGHT CARPAL TUNNEL RELEASE (Right: Wrist)     Patient location during evaluation: PACU Anesthesia Type: General Level of consciousness: awake and alert Pain management: pain level controlled Vital Signs Assessment: post-procedure vital signs reviewed and stable Respiratory status: spontaneous breathing, nonlabored ventilation, respiratory function stable and patient connected to nasal cannula oxygen Cardiovascular status: blood pressure returned to baseline and stable Postop Assessment: no apparent nausea or vomiting Anesthetic complications: no   No notable events documented.  Last Vitals:  Vitals:   09/08/22 1426 09/08/22 1434  BP: 130/74 121/80  Pulse: 81 79  Resp: 13 15  Temp:  36.5 C  SpO2: 99% 99%    Last Pain:  Vitals:   09/08/22 1434  TempSrc:   PainSc: 0-No pain                 Tiajuana Amass

## 2022-09-08 NOTE — Anesthesia Preprocedure Evaluation (Addendum)
Anesthesia Evaluation  Patient identified by MRN, date of birth, ID band Patient awake    Reviewed: Allergy & Precautions, NPO status , Patient's Chart, lab work & pertinent test results  Airway Mallampati: II  TM Distance: >3 FB     Dental  (+) Dental Advisory Given   Pulmonary former smoker,  Quit smoking 2010   breath sounds clear to auscultation       Cardiovascular negative cardio ROS   Rhythm:Regular Rate:Normal     Neuro/Psych  Headaches, PSYCHIATRIC DISORDERS Anxiety    GI/Hepatic Neg liver ROS, PUD, GERD  Medicated and Controlled,  Endo/Other  negative endocrine ROS  Renal/GU negative Renal ROS  negative genitourinary   Musculoskeletal negative musculoskeletal ROS (+)   Abdominal   Peds  Hematology negative hematology ROS (+)   Anesthesia Other Findings   Reproductive/Obstetrics                            Anesthesia Physical Anesthesia Plan  ASA: 2  Anesthesia Plan: General   Post-op Pain Management: Tylenol PO (pre-op)* and Toradol IV (intra-op)*   Induction: Intravenous  PONV Risk Score and Plan: 2 and Propofol infusion, TIVA, Dexamethasone and Ondansetron  Airway Management Planned: LMA  Additional Equipment:   Intra-op Plan:   Post-operative Plan: Extubation in OR  Informed Consent: I have reviewed the patients History and Physical, chart, labs and discussed the procedure including the risks, benefits and alternatives for the proposed anesthesia with the patient or authorized representative who has indicated his/her understanding and acceptance.     Dental advisory given  Plan Discussed with: CRNA  Anesthesia Plan Comments:        Anesthesia Quick Evaluation

## 2022-09-08 NOTE — Discharge Instructions (Addendum)
   [facility] Instructions Hand Surgery  Wound Care: Keep your hand elevated above the level of your heart.  Do not allow it to dangle by your side.  Keep the dressing dry and do not remove it unless your doctor advises you to do so.  He will usually change it at the time of your post-op visit.  Moving your fingers is advised to stimulate circulation but will depend on the site of your surgery.  If you have a splint applied, your doctor will advise you regarding movement.  Activity: Do not drive or operate machinery today.  Rest today and then you may return to your normal activity and work as indicated by your physician.  Diet:  Drink liquids today or eat a light diet.  You may resume a regular diet tomorrow.    General expectations: Pain for two to three days. Fingers may become slightly swollen.  Call your doctor if any of the following occur: Severe pain not relieved by pain medication. Elevated temperature. Dressing soaked with blood. Inability to move fingers. White or bluish color to fingers.  May take Tylenol after 6:50pm, if needed.  Post Anesthesia Home Care Instructions  Activity: Get plenty of rest for the remainder of the day. A responsible individual must stay with you for 24 hours following the procedure.  For the next 24 hours, DO NOT: -Drive a car -Paediatric nurse -Drink alcoholic beverages -Take any medication unless instructed by your physician -Make any legal decisions or sign important papers.  Meals: Start with liquid foods such as gelatin or soup. Progress to regular foods as tolerated. Avoid greasy, spicy, heavy foods. If nausea and/or vomiting occur, drink only clear liquids until the nausea and/or vomiting subsides. Call your physician if vomiting continues.  Special Instructions/Symptoms: Your throat may feel dry or sore from the anesthesia or the breathing tube placed in your throat during surgery. If this causes discomfort, gargle with warm  salt water. The discomfort should disappear within 24 hours.  If you had a scopolamine patch placed behind your ear for the management of post- operative nausea and/or vomiting:  1. The medication in the patch is effective for 72 hours, after which it should be removed.  Wrap patch in a tissue and discard in the trash. Wash hands thoroughly with soap and water. 2. You may remove the patch earlier than 72 hours if you experience unpleasant side effects which may include dry mouth, dizziness or visual disturbances. 3. Avoid touching the patch. Wash your hands with soap and water after contact with the patch.

## 2022-09-08 NOTE — Anesthesia Procedure Notes (Signed)
Procedure Name: LMA Insertion Date/Time: 09/08/2022 1:46 PM  Performed by: Verita Lamb, CRNAPre-anesthesia Checklist: Patient identified, Emergency Drugs available, Suction available and Patient being monitored Patient Re-evaluated:Patient Re-evaluated prior to induction Oxygen Delivery Method: Circle system utilized Preoxygenation: Pre-oxygenation with 100% oxygen Induction Type: IV induction Ventilation: Mask ventilation without difficulty LMA: LMA inserted LMA Size: 4.0 Number of attempts: 1 Airway Equipment and Method: Bite block Placement Confirmation: positive ETCO2, breath sounds checked- equal and bilateral and CO2 detector Tube secured with: Tape Dental Injury: Teeth and Oropharynx as per pre-operative assessment

## 2022-09-08 NOTE — Op Note (Signed)
09/08/2022 [facility]                              OPERATIVE REPORT   PREOPERATIVE DIAGNOSIS:  Right carpal tunnel syndrome.  POSTOPERATIVE DIAGNOSIS:  Right carpal tunnel syndrome.  PROCEDURE:  Right carpal tunnel release.  SURGEON:  Leanora Cover, MD  ASSISTANT:  none.  ANESTHESIA: General  IV FLUIDS:  Per anesthesia flow sheet.  ESTIMATED BLOOD LOSS:  Minimal.  COMPLICATIONS:  None.  SPECIMENS:  None.  TOURNIQUET TIME:    Total Tourniquet Time Documented: Upper Arm (Right) - 10 minutes Total: Upper Arm (Right) - 10 minutes   DISPOSITION:  Stable to PACU.  LOCATION:  SURGERY CENTER  INDICATIONS:  46 yo female with numbness and tingling right hand.  Nocturnal symptoms.  Positive nerve conduction studies.   She wishes to have a carpal tunnel release for management of her symptoms.  Risks, benefits and alternatives of surgery were discussed including the risk of blood loss; infection; damage to nerves, vessels, tendons, ligaments, bone; failure of surgery; need for additional surgery; complications with wound healing; continued pain; recurrence of carpal tunnel syndrome; and damage to motor branch. She voiced understanding of these risks and elected to proceed.   OPERATIVE COURSE:  After being identified preoperatively by myself, the patient and I agreed upon the procedure and site of procedure.  The surgical site was marked.  Surgical consent had been signed.  She was given IV vancomycin as preoperative antibiotic prophylaxis.  She was transferred to the operating room and placed on the operating room table in supine position with the Right upper extremity on an armboard.  General anesthesia was induced by the anesthesiologist.  Right upper extremity was prepped and draped in normal sterile orthopaedic fashion.  A surgical pause was performed between the surgeons, anesthesia, and operating room staff, and all were in agreement as to the patient, procedure,  and site of procedure.  Tourniquet at the proximal aspect of the extremity was inflated to 250 mmHg after exsanguination of the arm with an Esmarch bandage  Incision was made over the transverse carpal ligament and carried into the subcutaneous tissues by spreading technique.  Bipolar electrocautery was used to obtain hemostasis.  The palmar fascia was sharply incised.  The transverse carpal ligament was identified.  The fascia distal to the ligament was opened.  Retractor was placed and the flexor tendons were identified.  The flexor tendon to the ring finger was identified and retracted radially.  The transverse carpal ligament was then incised from distal to proximal under direct visualization.  Some of the hypothenar musculature came across the transverse carpal ligament.  This was carefully released.  Scissors were used to split the distal aspect of the volar antebrachial fascia.  A finger was placed into the wound to ensure complete decompression, which was the case.  The nerve was examined.  It was adherent to the radial leaflet.  The motor branch was identified and was intact.  The wound was copiously irrigated with sterile saline.  It was then closed with 4-0 nylon in a horizontal mattress fashion.  It was injected with 0.25% plain Marcaine to aid in postoperative analgesia.  It was dressed with sterile Xeroform, 4x4s, an ABD, and wrapped with Kerlix and an Ace bandage.  Tourniquet was deflated at 10 minutes.  Fingertips were pink with brisk capillary refill after deflation of the tourniquet.  Operative drapes were broken  down.  The patient was awoken from anesthesia safely.  She was transferred back to stretcher and taken to the PACU in stable condition.  I will see her back in the office in 1 week for postoperative followup.  I will give her a prescription for Norco 5/325 1-2 tabs PO q6 hours prn pain, dispense # 15.    Leanora Cover, MD Electronically signed, 09/08/22

## 2022-09-08 NOTE — Transfer of Care (Signed)
Immediate Anesthesia Transfer of Care Note  Patient: Crystal Mcintyre  Procedure(s) Performed: RIGHT CARPAL TUNNEL RELEASE (Right: Wrist)  Patient Location: PACU  Anesthesia Type:General  Level of Consciousness: awake and drowsy  Airway & Oxygen Therapy: Patient Spontanous Breathing and Patient connected to face mask oxygen  Post-op Assessment: Report given to RN and Post -op Vital signs reviewed and stable  Post vital signs: Reviewed and stable  Last Vitals:  Vitals Value Taken Time  BP    Temp    Pulse 75 09/08/22 1411  Resp    SpO2 100 % 09/08/22 1411  Vitals shown include unvalidated device data.  Last Pain:  Vitals:   09/08/22 1239  TempSrc: Oral  PainSc: 0-No pain      Patients Stated Pain Goal: 8 (16/83/72 9021)  Complications: No notable events documented.

## 2022-09-08 NOTE — H&P (Signed)
Crystal Mcintyre is an 46 y.o. female.   Chief Complaint: carpal tunnel syndrome HPI: 46 yo female with numbness and tingling right hand.  Nocturnal symptoms.  Positive nerve conduction studies.  She wishes to have right carpal tunnel release.  Allergies:  Allergies  Allergen Reactions   Excedrin Extra Strength [Asa-Apap-Caff Buffered] Other (See Comments)    Burns chest ( can take with omeprazole)    Past Medical History:  Diagnosis Date   Gastric ulcer    GERD (gastroesophageal reflux disease)    GOITER, MULTINODULAR 04/18/2009   Headache(784.0) 06/17/2007   IRREGULAR MENSTRUATION 06/17/2007   Migraines    Other anxiety states 01/30/2010   Scoliosis    THYROID STIMULATING HORMONE, ABNORMAL 01/10/2009   TOBACCO USE 01/10/2008   TRANSAMINASES, SERUM, ELEVATED 01/10/2009    Past Surgical History:  Procedure Laterality Date   IR ANGIOGRAM PELVIS SELECTIVE OR SUPRASELECTIVE  10/28/2021   IR ANGIOGRAM PELVIS SELECTIVE OR SUPRASELECTIVE  10/28/2021   IR ANGIOGRAM SELECTIVE EACH ADDITIONAL VESSEL  10/28/2021   IR ANGIOGRAM SELECTIVE EACH ADDITIONAL VESSEL  10/28/2021   IR EMBO TUMOR ORGAN ISCHEMIA INFARCT INC GUIDE ROADMAPPING  10/28/2021   IR RADIOLOGIST EVAL & MGMT  07/24/2021   IR RADIOLOGIST EVAL & MGMT  11/28/2021   IR RADIOLOGIST EVAL & MGMT  05/22/2022   IR US GUIDE VASC ACCESS LEFT  10/28/2021   IR US GUIDE VASC ACCESS RIGHT  10/28/2021   MASS EXCISION Right 07/04/2021   Procedure: RIGHT THUMB EXCISION MUCOID CYST AND DEBRIDEMENT INTERPHALANGEAL JOINT ARTHRITIS;  Surgeon: Leanora Cover, MD;  Location: [facility];  Service: Orthopedics;  Laterality: Right;   TONSILLECTOMY  as child   TYMPANOPLASTY  4 yrs ago   TYMPANOSTOMY TUBE PLACEMENT  as child   UPPER GASTROINTESTINAL ENDOSCOPY      Family History: Family History  Problem Relation Age of Onset   Colon polyps Mother    Pneumonia Father    Alcohol abuse Father    Breast cancer Paternal Aunt     Breast cancer Paternal Aunt    Diabetes Maternal Grandfather    Prostate cancer Paternal Grandfather    Rectal cancer Neg Hx    Stomach cancer Neg Hx    Colon cancer Neg Hx    Esophageal cancer Neg Hx     Social History:   reports that she quit smoking about 13 years ago. Her smoking use included cigarettes. She has never used smokeless tobacco. She reports current alcohol use. She reports that she does not use drugs.  Medications: Medications Prior to Admission  Medication Sig Dispense Refill   gabapentin (NEURONTIN) 100 MG capsule Take 2-3 capsules (200-300 mg total) by mouth at bedtime. 90 capsule 11   Multiple Vitamin (MULTI VITAMIN DAILY PO) Take 2 tablets by mouth daily.     pantoprazole (PROTONIX) 40 MG tablet TAKE 1 TABLET (40 MG TOTAL) BY MOUTH TWICE A DAY BEFORE MEALS (Patient taking differently: Take 40 mg by mouth 2 (two) times daily before a meal.) 90 tablet 2    Results for orders placed or performed during the hospital encounter of 09/08/22 (from the past 48 hour(s))  Pregnancy, urine POC     Status: None   Collection Time: 09/08/22 12:31 PM  Result Value Ref Range   Preg Test, Ur NEGATIVE NEGATIVE    Comment:        THE SENSITIVITY OF THIS METHODOLOGY IS >24 mIU/mL     No results found.    Blood  pressure 130/85, pulse 78, temperature 98.2 F (36.8 C), temperature source Oral, resp. rate 16, height '5\' 4"'$  (1.626 m), weight 78.2 kg, last menstrual period 12/29/2021, SpO2 100 %.  General appearance: alert, cooperative, and appears stated age Head: Normocephalic, without obvious abnormality, atraumatic Neck: supple, symmetrical, trachea midline Extremities: Intact sensation and capillary refill all digits.  +epl/fpl/io.  No wounds.  Pulses: 2+ and symmetric Skin: Skin color, texture, turgor normal. No rashes or lesions Neurologic: Grossly normal Incision/Wound: none  Assessment/Plan Right carpal tunnel syndrome.  Non operative and operative treatment options  have been discussed with the patient and patient wishes to proceed with operative treatment. Risks, benefits, and alternatives of surgery have been discussed and the patient agrees with the plan of care.   Leanora Cover 09/08/2022, 12:52 PM

## 2022-09-09 ENCOUNTER — Encounter (HOSPITAL_BASED_OUTPATIENT_CLINIC_OR_DEPARTMENT_OTHER): Payer: Self-pay | Admitting: Orthopedic Surgery

## 2022-10-08 ENCOUNTER — Other Ambulatory Visit: Payer: Self-pay | Admitting: Internal Medicine

## 2022-11-17 ENCOUNTER — Telehealth: Payer: Self-pay | Admitting: Internal Medicine

## 2022-11-17 DIAGNOSIS — M25569 Pain in unspecified knee: Secondary | ICD-10-CM

## 2022-11-17 NOTE — Telephone Encounter (Signed)
Patient is having pain in her knees.  Lenox Ahr been hurting for a long time.  Patient is requesting a referral.

## 2022-11-18 NOTE — Telephone Encounter (Signed)
A referral to sport medicine is sent.

## 2022-11-18 NOTE — Telephone Encounter (Signed)
Ok to refer to sports medicine or ortho for knee pains

## 2022-11-19 ENCOUNTER — Telehealth: Payer: Self-pay | Admitting: Internal Medicine

## 2022-11-19 NOTE — Telephone Encounter (Signed)
Patient returning call from earlier this week

## 2022-11-19 NOTE — Telephone Encounter (Signed)
Contact patient. Left a detail message that we sent in the referral that was requested by patient. No further action is needed.

## 2022-11-24 NOTE — Progress Notes (Unsigned)
    Crystal Mcintyre D.[place] Phone: [phone]   Assessment and Plan:     There are no diagnoses linked to this encounter.  ***   Pertinent previous records reviewed include ***   Follow Up: ***     Subjective:   I, Crystal Mcintyre, am serving as a Education administrator for Doctor Glennon Mac  Chief Complaint: knee pain   HPI:   11/25/2022 Patient is a 46 year old female complaining of knee pain. Patient states  Relevant Historical Information: ***  Additional pertinent review of systems negative.   Current Outpatient Medications:    gabapentin (NEURONTIN) 100 MG capsule, Take 2-3 capsules (200-300 mg total) by mouth at bedtime., Disp: 90 capsule, Rfl: 11   HYDROcodone-acetaminophen (NORCO/VICODIN) 5-325 MG tablet, 1-2 tabs PO q6 hours prn pain, Disp: 15 tablet, Rfl: 0   Multiple Vitamin (MULTI VITAMIN DAILY PO), Take 2 tablets by mouth daily., Disp: , Rfl:    pantoprazole (PROTONIX) 40 MG tablet, TAKE 1 TABLET (40 MG TOTAL) BY MOUTH TWICE A DAY BEFORE MEALS, Disp: 180 tablet, Rfl: 1   Objective:     There were no vitals filed for this visit.    There is no height or weight on file to calculate BMI.    Physical Exam:    ***   Electronically signed by:  Crystal Mcintyre D.[facility] 12:23 PM 11/24/22

## 2022-11-25 ENCOUNTER — Ambulatory Visit (INDEPENDENT_AMBULATORY_CARE_PROVIDER_SITE_OTHER): Payer: Commercial Managed Care - PPO

## 2022-11-25 ENCOUNTER — Ambulatory Visit (INDEPENDENT_AMBULATORY_CARE_PROVIDER_SITE_OTHER): Payer: Commercial Managed Care - PPO | Admitting: Sports Medicine

## 2022-11-25 VITALS — BP 120/86 | HR 80 | Ht 64.0 in | Wt 172.0 lb

## 2022-11-25 DIAGNOSIS — M25562 Pain in left knee: Secondary | ICD-10-CM

## 2022-11-25 DIAGNOSIS — M25561 Pain in right knee: Secondary | ICD-10-CM

## 2022-11-25 DIAGNOSIS — G8929 Other chronic pain: Secondary | ICD-10-CM

## 2022-11-25 DIAGNOSIS — M170 Bilateral primary osteoarthritis of knee: Secondary | ICD-10-CM

## 2022-11-25 MED ORDER — MELOXICAM 15 MG PO TABS: 15.0000 mg | ORAL_TABLET | Freq: Every day | ORAL | 0 refills | Status: DC

## 2022-11-25 NOTE — Patient Instructions (Addendum)
Good to see you Knee HEP PT referral  - Start meloxicam 15 mg daily x2 weeks.  If still having pain after 2 weeks, complete 3rd-week of meloxicam. May use remaining meloxicam as needed once daily for pain control.  Do not to use additional NSAIDs while taking meloxicam.  May use Tylenol [phone] mg 2 to 3 times a day for breakthrough pain. 3-4 week follow up

## 2022-12-03 ENCOUNTER — Ambulatory Visit: Payer: Commercial Managed Care - PPO | Attending: Sports Medicine

## 2022-12-03 ENCOUNTER — Other Ambulatory Visit: Payer: Self-pay

## 2022-12-03 DIAGNOSIS — R2689 Other abnormalities of gait and mobility: Secondary | ICD-10-CM | POA: Diagnosis present

## 2022-12-03 DIAGNOSIS — M25562 Pain in left knee: Secondary | ICD-10-CM | POA: Diagnosis present

## 2022-12-03 DIAGNOSIS — G8929 Other chronic pain: Secondary | ICD-10-CM | POA: Insufficient documentation

## 2022-12-03 DIAGNOSIS — M170 Bilateral primary osteoarthritis of knee: Secondary | ICD-10-CM | POA: Insufficient documentation

## 2022-12-03 DIAGNOSIS — M6281 Muscle weakness (generalized): Secondary | ICD-10-CM | POA: Diagnosis present

## 2022-12-03 DIAGNOSIS — M25561 Pain in right knee: Secondary | ICD-10-CM | POA: Insufficient documentation

## 2022-12-03 NOTE — Therapy (Signed)
OUTPATIENT PHYSICAL THERAPY LOWER EXTREMITY EVALUATION   Patient Name: Crystal Mcintyre MRN: [id number] DOB:1976-12-27, 46 y.o., female Today's Date: 12/03/2022  END OF SESSION:  PT End of Session - 12/03/22 1812     Visit Number 1    Number of Visits 17    Date for PT Re-Evaluation 01/28/23    Authorization Type [employer]    PT Start Time 1722    PT Stop Time 7829    PT Time Calculation (min) 36 min    Activity Tolerance Patient tolerated treatment well    Behavior During Therapy WFL for tasks assessed/performed             Past Medical History:  Diagnosis Date   Gastric ulcer    GERD (gastroesophageal reflux disease)    GOITER, MULTINODULAR 04/18/2009   Headache(784.0) 06/17/2007   IRREGULAR MENSTRUATION 06/17/2007   Migraines    Other anxiety states 01/30/2010   Scoliosis    THYROID STIMULATING HORMONE, ABNORMAL 01/10/2009   TOBACCO USE 01/10/2008   TRANSAMINASES, SERUM, ELEVATED 01/10/2009   Past Surgical History:  Procedure Laterality Date   CARPAL TUNNEL RELEASE Right 09/08/2022   Procedure: RIGHT CARPAL TUNNEL RELEASE;  Surgeon: Leanora Cover, MD;  Location: [facility];  Service: Orthopedics;  Laterality: Right;  30 MIN   IR ANGIOGRAM PELVIS SELECTIVE OR SUPRASELECTIVE  10/28/2021   IR ANGIOGRAM PELVIS SELECTIVE OR SUPRASELECTIVE  10/28/2021   IR ANGIOGRAM SELECTIVE EACH ADDITIONAL VESSEL  10/28/2021   IR ANGIOGRAM SELECTIVE EACH ADDITIONAL VESSEL  10/28/2021   IR EMBO TUMOR ORGAN ISCHEMIA INFARCT INC GUIDE ROADMAPPING  10/28/2021   IR RADIOLOGIST EVAL & MGMT  07/24/2021   IR RADIOLOGIST EVAL & MGMT  11/28/2021   IR RADIOLOGIST EVAL & MGMT  05/22/2022   IR US GUIDE VASC ACCESS LEFT  10/28/2021   IR US GUIDE VASC ACCESS RIGHT  10/28/2021   MASS EXCISION Right 07/04/2021   Procedure: RIGHT THUMB EXCISION MUCOID CYST AND DEBRIDEMENT INTERPHALANGEAL JOINT ARTHRITIS;  Surgeon: Leanora Cover, MD;  Location: [facility];  Service: Orthopedics;   Laterality: Right;   TONSILLECTOMY  as child   TYMPANOPLASTY  4 yrs ago   TYMPANOSTOMY TUBE PLACEMENT  as child   UPPER GASTROINTESTINAL ENDOSCOPY     Patient Active Problem List   Diagnosis Date Noted   Uterine leiomyoma 10/28/2021   Fibroids 10/28/2021   Hyperprolactinemia ([place]) 09/14/2015   Left low back pain 01/04/2014   Hyperthyroidism 09/17/2013   GERD (gastroesophageal reflux disease) 08/26/2013   Female fertility problem 01/28/2012   Preventative health care 02/12/2011   Gynecological examination 02/12/2011   Insomnia 02/12/2011   OTHER ANXIETY STATES 01/30/2010   GOITER, MULTINODULAR 04/18/2009   TRANSAMINASES, SERUM, ELEVATED 01/10/2009   IRREGULAR MENSTRUATION 06/17/2007   HEADACHE 06/17/2007    PCP: Burnis Medin, MD  REFERRING PROVIDER: Glennon Mac, DO  REFERRING DIAG: M25.561,M25.562,G89.29 (ICD-10-CM) - Chronic pain of both knees M17.0 (ICD-10-CM) - Bilateral primary osteoarthritis of knee  THERAPY DIAG:  Right knee pain, unspecified chronicity - Plan: PT plan of care cert/re-cert  Left knee pain, unspecified chronicity - Plan: PT plan of care cert/re-cert  Muscle weakness (generalized) - Plan: PT plan of care cert/re-cert  Other abnormalities of gait and mobility - Plan: PT plan of care cert/re-cert  Rationale for Evaluation and Treatment: Rehabilitation  ONSET DATE: Chronic  SUBJECTIVE:   SUBJECTIVE STATEMENT: Pt presents to PT with reports of knee pain, R>L, for past 6 months. Denies trauma or MOI  to either knee. Pt notes that stairs and transferring sit>stand increase her pain greatly. Feels like her pain has increased since being in menopause, also notices a decrease in LE strength and energy levels.   PERTINENT HISTORY: None  PAIN:  Are you having pain?  Yes: NPRS scale: 1/10 Worst: 8/10 Pain location: bilateral knees  Pain description: dull ache Aggravating factors: sit>stand, stairs Relieving factors: rest  PRECAUTIONS:  None  WEIGHT BEARING RESTRICTIONS: No  FALLS:  Has patient fallen in last 6 months? 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 [employer]  PLOF: Independent and Independent with basic ADLs  PATIENT GOALS: decrease knee pain in order to improve comfort with work and community activities   NEXT MD VISIT: 12/16/2022  OBJECTIVE:   DIAGNOSTIC FINDINGS:   See imaging   PATIENT SURVEYS:  FOTO: 71% function; 79% predicted   COGNITION: Overall cognitive status: Within functional limits for tasks assessed     SENSATION: WFL  MUSCLE LENGTH: Hamstrings: Right WNL deg; Left WNL deg  POSTURE: No Significant postural limitations  PALPATION: Slight TTP to distal patellar tendon  LOWER EXTREMITY ROM:  Active ROM Right eval Left eval  Hip flexion    Hip extension    Hip abduction    Hip adduction    Hip internal rotation    Hip external rotation    Knee flexion WNL WNL  Knee extension    Ankle dorsiflexion    Ankle plantarflexion    Ankle inversion    Ankle eversion     (Blank rows = not tested)  LOWER EXTREMITY MMT:  MMT Right eval Left eval  Hip flexion 4/5 4/5  Hip extension    Hip abduction 3+/5 3+/5  Hip adduction    Hip internal rotation    Hip external rotation    Knee flexion 5/5 5/5  Knee extension 5/5 5/5  Ankle dorsiflexion    Ankle plantarflexion    Ankle inversion    Ankle eversion     (Blank rows = not tested)  LOWER EXTREMITY SPECIAL TESTS:  Knee special tests: Posterior drawer test: negative and Lachman Test: negative  FUNCTIONAL TESTS:  30 Second Sit to Stand: 10 reps - slight R knee pain Squat: quad dominant squat  GAIT: Distance walked: 27f Assistive device utilized: None Level of assistance: Complete Independence Comments: no deviations    TREATMENT: [facility] Adult PT Treatment:                                                 DATE: 12/03/2022 Therapeutic Exercise: Quad sets x 5 - 5" hold Supine SLR x 5 each S/L clamshell x 5 each GTB S/L hip abd x 5 each Bridge x 5  PATIENT EDUCATION:  Education details: eval findings, FOTO, HEP, POC Person educated: Patient Education method: Explanation, Demonstration, and Handouts Education comprehension: verbalized understanding and returned demonstration  HOME EXERCISE PROGRAM: Access Code: [email or web address] URL: [email or web address] Date: 12/03/2022 Prepared by: DOctavio Manns Exercises - Supine Quadricep Sets  - 1 x daily - 7 x weekly - 2 sets - 10 reps - 5 sec hold - Active Straight Leg Raise with Quad Set  - 1 x daily - 7 x weekly - 3 sets - 10 reps - Clamshell with Resistance  - 1  x daily - 7 x weekly - 3 sets - 10 reps - green theraband hold - Sidelying Hip Abduction  - 1 x daily - 7 x weekly - 3 sets - 10 reps - Supine Bridge  - 1 x daily - 7 x weekly - 3 sets - 10 reps  ASSESSMENT:  CLINICAL IMPRESSION: Patient is a 46 y.o. F who was seen today for physical therapy evaluation and treatment for acute on chronic bilateral knee pain. Physical findings are consistent with physician impression as pt demonstrates decrease in proximal hip and quad strength as well as reduced functional mobility. Her FOTO score demonstrates decrease in subjective functional ability below PLOF. Pt would benefit from skilled PT services working on improving quad and proximal hip strength in order to decrease knee pain and improve comfort with mobility.    OBJECTIVE IMPAIRMENTS: decreased activity tolerance, decreased endurance, decreased strength, and pain.   ACTIVITY LIMITATIONS: standing, squatting, and stairs  PARTICIPATION LIMITATIONS: community activity, occupation, and yard work  PERSONAL FACTORS: Time since onset of injury/illness/exacerbation are also affecting patient's functional outcome.   REHAB POTENTIAL: Excellent  CLINICAL DECISION MAKING:  Stable/uncomplicated  EVALUATION COMPLEXITY: Low   GOALS: Goals reviewed with patient? No  SHORT TERM GOALS: Target date: 12/24/2022   Pt will be compliant and knowledgeable with initial HEP for improved comfort and carryover Baseline: initial HEP given  Goal status: INITIAL  2.  Pt will self report bilateral knee pain no greater than 5/10 for improved comfort and functional ability Baseline: 8/10 at worst Goal status: INITIAL   LONG TERM GOALS: Target date: 01/28/2023   Pt will improve FOTO function score to no less than 79% as proxy for functional improvement Baseline: 71% function Goal status: INITIAL   2.  Pt will increase 30 Second Sit to Stand rep count to no less than 12 reps with no increase in pain for improved balance, strength, and functional mobility Baseline: 10 reps with pain Goal status: INITIAL   3.  Pt will self report bilateral knee pain no greater than 2/10 for improved comfort and functional ability Baseline: 8/10 at worst Goal status: INITIAL   4.  Pt will improve all LE MMT to no less than 5/5 for improved comfort with functional mobiliity Baseline: see chart Goal status: INITIAL  5.  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

## 2022-12-07 NOTE — Therapy (Unsigned)
OUTPATIENT PHYSICAL THERAPY TREATMENT NOTE   Patient Name: Crystal Mcintyre MRN: [id number] DOB:10/02/1976, 46 y.o., female Today's Date: 12/08/2022  PCP: Burnis Medin, MD  REFERRING PROVIDER: Glennon Mac, DO   END OF SESSION:   PT End of Session - 12/08/22 1702     Visit Number 2    Number of Visits 17    Date for PT Re-Evaluation 01/28/23    Authorization Type U[employer]    PT Start Time 1700    PT Stop Time 8315    PT Time Calculation (min) 40 min    Activity Tolerance Patient tolerated treatment well    Behavior During Therapy WFL for tasks assessed/performed             Past Medical History:  Diagnosis Date   Gastric ulcer    GERD (gastroesophageal reflux disease)    GOITER, MULTINODULAR 04/18/2009   Headache(784.0) 06/17/2007   IRREGULAR MENSTRUATION 06/17/2007   Migraines    Other anxiety states 01/30/2010   Scoliosis    THYROID STIMULATING HORMONE, ABNORMAL 01/10/2009   TOBACCO USE 01/10/2008   TRANSAMINASES, SERUM, ELEVATED 01/10/2009   Past Surgical History:  Procedure Laterality Date   CARPAL TUNNEL RELEASE Right 09/08/2022   Procedure: RIGHT CARPAL TUNNEL RELEASE;  Surgeon: Leanora Cover, MD;  Location: [facility];  Service: Orthopedics;  Laterality: Right;  30 MIN   IR ANGIOGRAM PELVIS SELECTIVE OR SUPRASELECTIVE  10/28/2021   IR ANGIOGRAM PELVIS SELECTIVE OR SUPRASELECTIVE  10/28/2021   IR ANGIOGRAM SELECTIVE EACH ADDITIONAL VESSEL  10/28/2021   IR ANGIOGRAM SELECTIVE EACH ADDITIONAL VESSEL  10/28/2021   IR EMBO TUMOR ORGAN ISCHEMIA INFARCT INC GUIDE ROADMAPPING  10/28/2021   IR RADIOLOGIST EVAL & MGMT  07/24/2021   IR RADIOLOGIST EVAL & MGMT  11/28/2021   IR RADIOLOGIST EVAL & MGMT  05/22/2022   IR US GUIDE VASC ACCESS LEFT  10/28/2021   IR US GUIDE VASC ACCESS RIGHT  10/28/2021   MASS EXCISION Right 07/04/2021   Procedure: RIGHT THUMB EXCISION MUCOID CYST AND DEBRIDEMENT INTERPHALANGEAL JOINT ARTHRITIS;  Surgeon: Leanora Cover, MD;   Location: [facility];  Service: Orthopedics;  Laterality: Right;   TONSILLECTOMY  as child   TYMPANOPLASTY  4 yrs ago   TYMPANOSTOMY TUBE PLACEMENT  as child   UPPER GASTROINTESTINAL ENDOSCOPY     Patient Active Problem List   Diagnosis Date Noted   Uterine leiomyoma 10/28/2021   Fibroids 10/28/2021   Hyperprolactinemia ([place]) 09/14/2015   Left low back pain 01/04/2014   Hyperthyroidism 09/17/2013   GERD (gastroesophageal reflux disease) 08/26/2013   Female fertility problem 01/28/2012   Preventative health care 02/12/2011   Gynecological examination 02/12/2011   Insomnia 02/12/2011   OTHER ANXIETY STATES 01/30/2010   GOITER, MULTINODULAR 04/18/2009   TRANSAMINASES, SERUM, ELEVATED 01/10/2009   IRREGULAR MENSTRUATION 06/17/2007   HEADACHE 06/17/2007    REFERRING DIAG: M25.561,M25.562,G89.29 (ICD-10-CM) - Chronic pain of both knees M17.0 (ICD-10-CM) - Bilateral primary osteoarthritis of knee  THERAPY DIAG: Right knee pain, unspecified chronicity - Plan: PT plan of care cert/re-cert   Left knee pain, unspecified chronicity - Plan: PT plan of care cert/re-cert   Muscle weakness (generalized) - Plan: PT plan of care cert/re-cert   Other abnormalities of gait and mobility - Plan: PT plan of care cert/re-cert  Rationale for Evaluation and Treatment Rehabilitation  PERTINENT HISTORY: none  PRECAUTIONS: none  SUBJECTIVE:  SUBJECTIVE STATEMENT:  No new c/o, has been compliant with HEP.   PAIN:  Are you having pain? No   OBJECTIVE: (objective measures completed at initial evaluation unless otherwise dated)   DIAGNOSTIC FINDINGS:             See imaging    PATIENT SURVEYS:  FOTO: 71% function; 79% predicted    COGNITION: Overall cognitive status: Within functional limits for  tasks assessed                         SENSATION: WFL   MUSCLE LENGTH: Hamstrings: Right WNL deg; Left WNL deg   POSTURE: No Significant postural limitations   PALPATION: Slight TTP to distal patellar tendon   LOWER EXTREMITY ROM:   Active ROM Right eval Left eval  Hip flexion      Hip extension      Hip abduction      Hip adduction      Hip internal rotation      Hip external rotation      Knee flexion WNL WNL  Knee extension      Ankle dorsiflexion      Ankle plantarflexion      Ankle inversion      Ankle eversion       (Blank rows = not tested)   LOWER EXTREMITY MMT:   MMT Right eval Left eval  Hip flexion 4/5 4/5  Hip extension      Hip abduction 3+/5 3+/5  Hip adduction      Hip internal rotation      Hip external rotation      Knee flexion 5/5 5/5  Knee extension 5/5 5/5  Ankle dorsiflexion      Ankle plantarflexion      Ankle inversion      Ankle eversion       (Blank rows = not tested)   LOWER EXTREMITY SPECIAL TESTS:  Knee special tests: Posterior drawer test: negative and Lachman Test: negative   FUNCTIONAL TESTS:  30 Second Sit to Stand: 10 reps - slight R knee pain Squat: quad dominant squat   GAIT: Distance walked: 68f Assistive device utilized: None Level of assistance: Complete Independence Comments: no deviations      TREATMENT: [facility] Adult PT Treatment:                                                DATE: 12/08/22 Therapeutic Exercise: Nustep L2 8 min Quad sets 3s 15x B Supine SLR 2# 15x B S/L clamshell x 15 each GTB S/L hip abd x 15 B Supine hip fallouts GTB 15x Bridge w/ball 15x SAQs 2# 15/15 Bridge against GTB 15x FAQs with adduction15x PF against wall 15x DF against wall 15x March against wall 15/15   [facility] Adult PT Treatment:                                                DATE: 12/03/2022 Therapeutic Exercise: Quad sets x 5 - 5" hold Supine SLR x 5 each S/L clamshell x 5 each GTB S/L hip abd x 5 each Bridge x  5   PATIENT EDUCATION:  Education details: eval findings, FOTO, HEP, POC  Person educated: Patient Education method: Explanation, Demonstration, and Handouts Education comprehension: verbalized understanding and returned demonstration   HOME EXERCISE PROGRAM: Access Code: [email or web address] URL: [email or web address] Date: 12/03/2022 Prepared by: Octavio Manns   Exercises - Supine Quadricep Sets  - 1 x daily - 7 x weekly - 2 sets - 10 reps - 5 sec hold - Active Straight Leg Raise with Quad Set  - 1 x daily - 7 x weekly - 3 sets - 10 reps - Clamshell with Resistance  - 1 x daily - 7 x weekly - 3 sets - 10 reps - green theraband hold - Sidelying Hip Abduction  - 1 x daily - 7 x weekly - 3 sets - 10 reps - Supine Bridge  - 1 x daily - 7 x weekly - 3 sets - 10 reps   ASSESSMENT:   CLINICAL IMPRESSION: Patient returns for first f/u session.  No change in symptoms.  Today, focus was on quad strength with emphasis on TKE and VMO.  HEP reviewed as noted.  Added reps and weight as noted.  Able to complete all requested tasks w/o any flare-up of symptoms  OBJECTIVE IMPAIRMENTS: decreased activity tolerance, decreased endurance, decreased strength, and pain.    ACTIVITY LIMITATIONS: standing, squatting, and stairs   PARTICIPATION LIMITATIONS: community activity, occupation, and yard work   PERSONAL FACTORS: Time since onset of injury/illness/exacerbation are also affecting patient's functional outcome.    REHAB POTENTIAL: Excellent   CLINICAL DECISION MAKING: Stable/uncomplicated   EVALUATION COMPLEXITY: Low     GOALS: Goals reviewed with patient? No   SHORT TERM GOALS: Target date: 12/24/2022   Pt will be compliant and knowledgeable with initial HEP for improved comfort and carryover Baseline: initial HEP given  Goal status: INITIAL   2.  Pt will self report bilateral knee pain no greater than 5/10 for improved comfort and functional ability Baseline: 8/10 at worst Goal  status: INITIAL    LONG TERM GOALS: Target date: 01/28/2023   Pt will improve FOTO function score to no less than 79% as proxy for functional improvement Baseline: 71% function Goal status: INITIAL    2.  Pt will increase 30 Second Sit to Stand rep count to no less than 12 reps with no increase in pain for improved balance, strength, and functional mobility Baseline: 10 reps with pain Goal status: INITIAL    3.  Pt will self report bilateral knee pain no greater than 2/10 for improved comfort and functional ability Baseline: 8/10 at worst Goal status: INITIAL    4.  Pt will improve all LE MMT to no less than 5/5 for improved comfort with functional mobiliity Baseline: see chart Goal status: INITIAL   5.  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

## 2022-12-08 ENCOUNTER — Ambulatory Visit: Payer: Commercial Managed Care - PPO

## 2022-12-08 DIAGNOSIS — M25561 Pain in right knee: Secondary | ICD-10-CM

## 2022-12-08 DIAGNOSIS — M6281 Muscle weakness (generalized): Secondary | ICD-10-CM

## 2022-12-08 DIAGNOSIS — M25562 Pain in left knee: Secondary | ICD-10-CM

## 2022-12-10 ENCOUNTER — Ambulatory Visit: Payer: Commercial Managed Care - PPO

## 2022-12-10 DIAGNOSIS — M25562 Pain in left knee: Secondary | ICD-10-CM

## 2022-12-10 DIAGNOSIS — M25561 Pain in right knee: Secondary | ICD-10-CM

## 2022-12-10 DIAGNOSIS — M6281 Muscle weakness (generalized): Secondary | ICD-10-CM

## 2022-12-10 DIAGNOSIS — R2689 Other abnormalities of gait and mobility: Secondary | ICD-10-CM

## 2022-12-10 NOTE — Therapy (Addendum)
OUTPATIENT PHYSICAL THERAPY TREATMENT NOTE/DC SUMMARY   Patient Name: Crystal Mcintyre MRN: [id number] DOB:1976-11-01, 46 y.o., female Today's Date: 12/10/2022  PCP: Burnis Medin, MD  REFERRING PROVIDER: Glennon Mac, DO  PHYSICAL THERAPY DISCHARGE SUMMARY  Visits from Start of Care: 3  Current functional level related to goals / functional outcomes: UTA   Remaining deficits: UTA   Education / Equipment: HEP   Patient agrees to discharge. Patient goals were partially met. Patient is being discharged due to not returning since the last visit.  END OF SESSION:   PT End of Session - 12/10/22 1655     Visit Number 3    Number of Visits 17    Date for PT Re-Evaluation 01/28/23    Authorization Type UHC    PT Start Time 9983    PT Stop Time 3825    PT Time Calculation (min) 38 min    Activity Tolerance Patient tolerated treatment well    Behavior During Therapy WFL for tasks assessed/performed              Past Medical History:  Diagnosis Date   Gastric ulcer    GERD (gastroesophageal reflux disease)    GOITER, MULTINODULAR 04/18/2009   Headache(784.0) 06/17/2007   IRREGULAR MENSTRUATION 06/17/2007   Migraines    Other anxiety states 01/30/2010   Scoliosis    THYROID STIMULATING HORMONE, ABNORMAL 01/10/2009   TOBACCO USE 01/10/2008   TRANSAMINASES, SERUM, ELEVATED 01/10/2009   Past Surgical History:  Procedure Laterality Date   CARPAL TUNNEL RELEASE Right 09/08/2022   Procedure: RIGHT CARPAL TUNNEL RELEASE;  Surgeon: Leanora Cover, MD;  Location: [facility];  Service: Orthopedics;  Laterality: Right;  30 MIN   IR ANGIOGRAM PELVIS SELECTIVE OR SUPRASELECTIVE  10/28/2021   IR ANGIOGRAM PELVIS SELECTIVE OR SUPRASELECTIVE  10/28/2021   IR ANGIOGRAM SELECTIVE EACH ADDITIONAL VESSEL  10/28/2021   IR ANGIOGRAM SELECTIVE EACH ADDITIONAL VESSEL  10/28/2021   IR EMBO TUMOR ORGAN ISCHEMIA INFARCT INC GUIDE ROADMAPPING  10/28/2021   IR RADIOLOGIST  EVAL & MGMT  07/24/2021   IR RADIOLOGIST EVAL & MGMT  11/28/2021   IR RADIOLOGIST EVAL & MGMT  05/22/2022   IR US GUIDE VASC ACCESS LEFT  10/28/2021   IR US GUIDE VASC ACCESS RIGHT  10/28/2021   MASS EXCISION Right 07/04/2021   Procedure: RIGHT THUMB EXCISION MUCOID CYST AND DEBRIDEMENT INTERPHALANGEAL JOINT ARTHRITIS;  Surgeon: Leanora Cover, MD;  Location: [facility];  Service: Orthopedics;  Laterality: Right;   TONSILLECTOMY  as child   TYMPANOPLASTY  4 yrs ago   TYMPANOSTOMY TUBE PLACEMENT  as child   UPPER GASTROINTESTINAL ENDOSCOPY     Patient Active Problem List   Diagnosis Date Noted   Uterine leiomyoma 10/28/2021   Fibroids 10/28/2021   Hyperprolactinemia ([place]) 09/14/2015   Left low back pain 01/04/2014   Hyperthyroidism 09/17/2013   GERD (gastroesophageal reflux disease) 08/26/2013   Female fertility problem 01/28/2012   Preventative health care 02/12/2011   Gynecological examination 02/12/2011   Insomnia 02/12/2011   OTHER ANXIETY STATES 01/30/2010   GOITER, MULTINODULAR 04/18/2009   TRANSAMINASES, SERUM, ELEVATED 01/10/2009   IRREGULAR MENSTRUATION 06/17/2007   HEADACHE 06/17/2007    REFERRING DIAG: M25.561,M25.562,G89.29 (ICD-10-CM) - Chronic pain of both knees M17.0 (ICD-10-CM) - Bilateral primary osteoarthritis of knee  THERAPY DIAG: Right knee pain, unspecified chronicity - Plan: PT plan of care cert/re-cert   Left knee pain, unspecified chronicity - Plan: PT plan of care cert/re-cert  Muscle weakness (generalized) - Plan: PT plan of care cert/re-cert   Other abnormalities of gait and mobility - Plan: PT plan of care cert/re-cert  Rationale for Evaluation and Treatment Rehabilitation  PERTINENT HISTORY: none  PRECAUTIONS: none  SUBJECTIVE:                                                                                                                                                                                      SUBJECTIVE STATEMENT:  Patient reports no pain currently and HEP compliance.    PAIN:  Are you having pain? No   OBJECTIVE: (objective measures completed at initial evaluation unless otherwise dated)   DIAGNOSTIC FINDINGS:             See imaging    PATIENT SURVEYS:  FOTO: 71% function; 79% predicted    COGNITION: Overall cognitive status: Within functional limits for tasks assessed                         SENSATION: WFL   MUSCLE LENGTH: Hamstrings: Right WNL deg; Left WNL deg   POSTURE: No Significant postural limitations   PALPATION: Slight TTP to distal patellar tendon   LOWER EXTREMITY ROM:   Active ROM Right eval Left eval  Hip flexion      Hip extension      Hip abduction      Hip adduction      Hip internal rotation      Hip external rotation      Knee flexion WNL WNL  Knee extension      Ankle dorsiflexion      Ankle plantarflexion      Ankle inversion      Ankle eversion       (Blank rows = not tested)   LOWER EXTREMITY MMT:   MMT Right eval Left eval  Hip flexion 4/5 4/5  Hip extension      Hip abduction 3+/5 3+/5  Hip adduction      Hip internal rotation      Hip external rotation      Knee flexion 5/5 5/5  Knee extension 5/5 5/5  Ankle dorsiflexion      Ankle plantarflexion      Ankle inversion      Ankle eversion       (Blank rows = not tested)   LOWER EXTREMITY SPECIAL TESTS:  Knee special tests: Posterior drawer test: negative and Lachman Test: negative   FUNCTIONAL TESTS:  30 Second Sit to Stand: 10 reps - slight R knee pain Squat: quad dominant squat   GAIT: Distance walked: 78f Assistive device utilized: None  Level of assistance: Complete Independence Comments: no deviations      TREATMENT: [facility] Adult PT Treatment:                                                DATE: 12/10/2022 Therapeutic Exercise: Nustep level 5 x 5 mins Sidestepping at counter RTB at ankles x4 laps Standing hip extension RTB at ankles 2x10 BIL Step up 6" with  march 2x10 BIL 10# KB in opposite hand Omega knee flexion 35# 2x10 Omega knee extension 10# 2x10 Slant board gastroc stretch x2' Supine clamshell GTB 2x10 Supine hip fallouts GTB x10 Bridge w/ball 2x10 PF against wall 2x10 DF against wall x15  [facility] Adult PT Treatment:                                                DATE: 12/08/22 Therapeutic Exercise: Nustep L2 8 min Quad sets 3s 15x B Supine SLR 2# 15x B S/L clamshell x 15 each GTB S/L hip abd x 15 B Supine hip fallouts GTB 15x Bridge w/ball 15x SAQs 2# 15/15 Bridge against GTB 15x FAQs with adduction15x PF against wall 15x DF against wall 15x March against wall 15/15   [facility] Adult PT Treatment:                                                DATE: 12/03/2022 Therapeutic Exercise: Quad sets x 5 - 5" hold Supine SLR x 5 each S/L clamshell x 5 each GTB S/L hip abd x 5 each Bridge x 5   PATIENT EDUCATION:  Education details: eval findings, FOTO, HEP, POC Person educated: Patient Education method: Explanation, Demonstration, and Handouts Education comprehension: verbalized understanding and returned demonstration   HOME EXERCISE PROGRAM: Access Code: [email or web address] URL: [email or web address] Date: 12/03/2022 Prepared by: Octavio Manns   Exercises - Supine Quadricep Sets  - 1 x daily - 7 x weekly - 2 sets - 10 reps - 5 sec hold - Active Straight Leg Raise with Quad Set  - 1 x daily - 7 x weekly - 3 sets - 10 reps - Clamshell with Resistance  - 1 x daily - 7 x weekly - 3 sets - 10 reps - green theraband hold - Sidelying Hip Abduction  - 1 x daily - 7 x weekly - 3 sets - 10 reps - Supine Bridge  - 1 x daily - 7 x weekly - 3 sets - 10 reps   ASSESSMENT:   CLINICAL IMPRESSION:  Patient presents to PT with no current pain in her knees and reports HEP compliance. Session today progressed knee and proximal hip strengthening with no increase in pain throughout session. Patient was able to tolerate all prescribed  exercises with no adverse effects. Patient continues to benefit from skilled PT services and should be progressed as able to improve functional independence.    OBJECTIVE IMPAIRMENTS: decreased activity tolerance, decreased endurance, decreased strength, and pain.    ACTIVITY LIMITATIONS: standing, squatting, and stairs   PARTICIPATION LIMITATIONS: community activity, occupation, and yard work   PERSONAL FACTORS: Time since onset of injury/illness/exacerbation are  also affecting patient's functional outcome.    REHAB POTENTIAL: Excellent   CLINICAL DECISION MAKING: Stable/uncomplicated   EVALUATION COMPLEXITY: Low     GOALS: Goals reviewed with patient? No   SHORT TERM GOALS: Target date: 12/24/2022   Pt will be compliant and knowledgeable with initial HEP for improved comfort and carryover Baseline: initial HEP given  Goal status: INITIAL   2.  Pt will self report bilateral knee pain no greater than 5/10 for improved comfort and functional ability Baseline: 8/10 at worst Goal status: INITIAL    LONG TERM GOALS: Target date: 01/28/2023   Pt will improve FOTO function score to no less than 79% as proxy for functional improvement Baseline: 71% function Goal status: INITIAL    2.  Pt will increase 30 Second Sit to Stand rep count to no less than 12 reps with no increase in pain for improved balance, strength, and functional mobility Baseline: 10 reps with pain Goal status: INITIAL    3.  Pt will self report bilateral knee pain no greater than 2/10 for improved comfort and functional ability Baseline: 8/10 at worst Goal status: INITIAL    4.  Pt will improve all LE MMT to no less than 5/5 for improved comfort with functional mobiliity Baseline: see chart Goal status: INITIAL   5.  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

## 2022-12-15 ENCOUNTER — Ambulatory Visit: Payer: Commercial Managed Care - PPO

## 2022-12-15 NOTE — Progress Notes (Signed)
    Aleen Sells D.[facility] [address] [place] [address] Phone: [phone]   Assessment and Plan:     1. Chronic pain of both knees 2. Bilateral primary osteoarthritis of knee -Chronic with exacerbation, subsequent visit - Overall moderate improvement in symptoms with 3-week course of meloxicam 15 mg daily, HEP and physical therapy - Discontinue daily meloxicam and may use remainder as needed - Continue HEP and physical therapy    Pertinent previous records reviewed include outpatient rehab note 12/10/2022, outpatient rehab note 12/08/2022, outpatient rehab note 12/03/2022   Follow Up: As needed if no improvement or worsening of symptoms.  Could consider intra-articular CSI   Subjective:   I, Crystal Mcintyre, am serving as a Neurosurgeon for Doctor Richardean Sale   Chief Complaint: knee pain    HPI:    11/25/2022 Patient is a 46 year old female complaining of knee pain. Patient states that her knees have beenin pain for years but more recently 6 month of more increasing pain has pain bending and sometimes getting off the toilet , anterior knee pain , no numbness or tingling , no radiating pain, ib for the pain and that doesn't do much ,no locking or clicking , does get popping not painful  more uncomfortable.  12/16/2022 Patient states that she is doing good     Relevant Historical Information: GERD  Additional pertinent review of systems negative.   Current Outpatient Medications:    gabapentin (NEURONTIN) 100 MG capsule, Take 2-3 capsules (200-300 mg total) by mouth at bedtime., Disp: 90 capsule, Rfl: 11   meloxicam (MOBIC) 15 MG tablet, Take 1 tablet (15 mg total) by mouth daily., Disp: 30 tablet, Rfl: 0   Multiple Vitamin (MULTI VITAMIN DAILY PO), Take 2 tablets by mouth daily., Disp: , Rfl:    pantoprazole (PROTONIX) 40 MG tablet, TAKE 1 TABLET (40 MG TOTAL) BY MOUTH TWICE A DAY BEFORE MEALS, Disp: 180 tablet, Rfl: 1    HYDROcodone-acetaminophen (NORCO/VICODIN) 5-325 MG tablet, 1-2 tabs PO q6 hours prn pain (Patient not taking: Reported on 12/03/2022), Disp: 15 tablet, Rfl: 0   Objective:     Vitals:   12/16/22 1302  BP: 130/80  Pulse: 96  SpO2: 98%  Weight: 181 lb (82.1 kg)  Height: 5\' 4"  (1.626 m)      Body mass index is 31.07 kg/m.    Physical Exam:    General:  awake, alert oriented, no acute distress nontoxic Skin: no suspicious lesions or rashes Neuro:sensation intact and strength 5/5 with no deficits, no atrophy, normal muscle tone Psych: No signs of anxiety, depression or other mood disorder  Bilateral knee: No swelling No deformity Neg fluid wave, joint milking ROM Flex 110 , Ext 0  NTTP over the quad tendon, medial fem condyle, lat fem condyle, patella, plica, patella tendon, tibial tuberostiy, fibular head, posterior fossa, pes anserine bursa, gerdy's tubercle, medial jt line, lateral jt line Neg anterior and posterior drawer Neg lachman Neg sag sign Negative varus stress Negative valgus stress Negative McMurray Negative Thessaly  Gait normal    Electronically signed by:  Aleen Sells D.[facility] 1:12 PM 12/16/22

## 2022-12-16 ENCOUNTER — Ambulatory Visit (INDEPENDENT_AMBULATORY_CARE_PROVIDER_SITE_OTHER): Payer: Commercial Managed Care - PPO | Admitting: Sports Medicine

## 2022-12-16 VITALS — BP 130/80 | HR 96 | Ht 64.0 in | Wt 181.0 lb

## 2022-12-16 DIAGNOSIS — M25561 Pain in right knee: Secondary | ICD-10-CM | POA: Diagnosis not present

## 2022-12-16 DIAGNOSIS — M25562 Pain in left knee: Secondary | ICD-10-CM

## 2022-12-16 DIAGNOSIS — G8929 Other chronic pain: Secondary | ICD-10-CM

## 2022-12-16 DIAGNOSIS — M170 Bilateral primary osteoarthritis of knee: Secondary | ICD-10-CM | POA: Diagnosis not present

## 2022-12-16 NOTE — Patient Instructions (Addendum)
Good to see you Discontinue daily meloxicam and use remainder as needed Continue PT  As needed follow up

## 2022-12-17 ENCOUNTER — Ambulatory Visit: Payer: Commercial Managed Care - PPO

## 2022-12-17 DIAGNOSIS — M25561 Pain in right knee: Secondary | ICD-10-CM | POA: Diagnosis not present

## 2022-12-17 DIAGNOSIS — M25562 Pain in left knee: Secondary | ICD-10-CM

## 2022-12-17 DIAGNOSIS — M6281 Muscle weakness (generalized): Secondary | ICD-10-CM

## 2022-12-17 NOTE — Therapy (Signed)
OUTPATIENT PHYSICAL THERAPY TREATMENT NOTE   Patient Name: Crystal Mcintyre MRN: [id number] DOB:Jan 30, 1976, 46 y.o., female Today's Date: 12/17/2022  PCP: Burnis Medin, MD  REFERRING PROVIDER: Glennon Mac, DO   END OF SESSION:   PT End of Session - 12/17/22 1758     Visit Number 4    Number of Visits 17    Date for PT Re-Evaluation 01/28/23    Authorization Type [employer]    PT Start Time 1017    PT Stop Time 5102    PT Time Calculation (min) 38 min    Activity Tolerance Patient tolerated treatment well    Behavior During Therapy WFL for tasks assessed/performed              Past Medical History:  Diagnosis Date   Gastric ulcer    GERD (gastroesophageal reflux disease)    GOITER, MULTINODULAR 04/18/2009   Headache(784.0) 06/17/2007   IRREGULAR MENSTRUATION 06/17/2007   Migraines    Other anxiety states 01/30/2010   Scoliosis    THYROID STIMULATING HORMONE, ABNORMAL 01/10/2009   TOBACCO USE 01/10/2008   TRANSAMINASES, SERUM, ELEVATED 01/10/2009   Past Surgical History:  Procedure Laterality Date   CARPAL TUNNEL RELEASE Right 09/08/2022   Procedure: RIGHT CARPAL TUNNEL RELEASE;  Surgeon: Leanora Cover, MD;  Location: [place];  Service: Orthopedics;  Laterality: Right;  30 MIN   IR ANGIOGRAM PELVIS SELECTIVE OR SUPRASELECTIVE  10/28/2021   IR ANGIOGRAM PELVIS SELECTIVE OR SUPRASELECTIVE  10/28/2021   IR ANGIOGRAM SELECTIVE EACH ADDITIONAL VESSEL  10/28/2021   IR ANGIOGRAM SELECTIVE EACH ADDITIONAL VESSEL  10/28/2021   IR EMBO TUMOR ORGAN ISCHEMIA INFARCT INC GUIDE ROADMAPPING  10/28/2021   IR RADIOLOGIST EVAL & MGMT  07/24/2021   IR RADIOLOGIST EVAL & MGMT  11/28/2021   IR RADIOLOGIST EVAL & MGMT  05/22/2022   IR US GUIDE VASC ACCESS LEFT  10/28/2021   IR US GUIDE VASC ACCESS RIGHT  10/28/2021   MASS EXCISION Right 07/04/2021   Procedure: RIGHT THUMB EXCISION MUCOID CYST AND DEBRIDEMENT INTERPHALANGEAL JOINT ARTHRITIS;  Surgeon: Leanora Cover, MD;   Location: [facility];  Service: Orthopedics;  Laterality: Right;   TONSILLECTOMY  as child   TYMPANOPLASTY  4 yrs ago   TYMPANOSTOMY TUBE PLACEMENT  as child   UPPER GASTROINTESTINAL ENDOSCOPY     Patient Active Problem List   Diagnosis Date Noted   Uterine leiomyoma 10/28/2021   Fibroids 10/28/2021   Hyperprolactinemia ([place]) 09/14/2015   Left low back pain 01/04/2014   Hyperthyroidism 09/17/2013   GERD (gastroesophageal reflux disease) 08/26/2013   Female fertility problem 01/28/2012   Preventative health care 02/12/2011   Gynecological examination 02/12/2011   Insomnia 02/12/2011   OTHER ANXIETY STATES 01/30/2010   GOITER, MULTINODULAR 04/18/2009   TRANSAMINASES, SERUM, ELEVATED 01/10/2009   IRREGULAR MENSTRUATION 06/17/2007   HEADACHE 06/17/2007    REFERRING DIAG: M25.561,M25.562,G89.29 (ICD-10-CM) - Chronic pain of both knees M17.0 (ICD-10-CM) - Bilateral primary osteoarthritis of knee  THERAPY DIAG: Right knee pain, unspecified chronicity - Plan: PT plan of care cert/re-cert   Left knee pain, unspecified chronicity - Plan: PT plan of care cert/re-cert   Muscle weakness (generalized) - Plan: PT plan of care cert/re-cert   Other abnormalities of gait and mobility - Plan: PT plan of care cert/re-cert  Rationale for Evaluation and Treatment Rehabilitation  PERTINENT HISTORY: none  PRECAUTIONS: none  SUBJECTIVE:  SUBJECTIVE STATEMENT: No pain reported over past 7 days.  MD f/u yesterday, taken off of Meloxicam to assess progress.  Continue PT at clinician discretion   PAIN:  Are you having pain? No   OBJECTIVE: (objective measures completed at initial evaluation unless otherwise dated)   DIAGNOSTIC FINDINGS:             See imaging    PATIENT SURVEYS:  FOTO: 71%  function; 79% predicted    COGNITION: Overall cognitive status: Within functional limits for tasks assessed                         SENSATION: WFL   MUSCLE LENGTH: Hamstrings: Right WNL deg; Left WNL deg   POSTURE: No Significant postural limitations   PALPATION: Slight TTP to distal patellar tendon   LOWER EXTREMITY ROM:   Active ROM Right eval Left eval  Hip flexion      Hip extension      Hip abduction      Hip adduction      Hip internal rotation      Hip external rotation      Knee flexion WNL WNL  Knee extension      Ankle dorsiflexion      Ankle plantarflexion      Ankle inversion      Ankle eversion       (Blank rows = not tested)   LOWER EXTREMITY MMT:   MMT Right eval Left eval  Hip flexion 4/5 4/5  Hip extension      Hip abduction 3+/5 3+/5  Hip adduction      Hip internal rotation      Hip external rotation      Knee flexion 5/5 5/5  Knee extension 5/5 5/5  Ankle dorsiflexion      Ankle plantarflexion      Ankle inversion      Ankle eversion       (Blank rows = not tested)   LOWER EXTREMITY SPECIAL TESTS:  Knee special tests: Posterior drawer test: negative and Lachman Test: negative   FUNCTIONAL TESTS:  30 Second Sit to Stand: 10 reps - slight R knee pain Squat: quad dominant squat   GAIT: Distance walked: 6f Assistive device utilized: None Level of assistance: Complete Independence Comments: no deviations      TREATMENT: [facility] Adult PT Treatment:                                                DATE: 12/17/22 Therapeutic Exercise: Nustep L6 8 min Quad sets 3s 15x B Supine SLR 5# 15x B S/L clamshell x 15 each GTB S/L hip abd x 15 B 5# Supine hip fallouts GTB 15x Bridge w/2000g ball 15x SAQs 5# 15/15 PF against wall 15x single leg Step downs 4 in  15/15  [facility]                                                [facility]: [facility]20[facility] Therapeutic Exercise: Nustep level 5 x 5 mins Sidestepping at counter RTB at  ankles x4 laps Standing hip extension RTB at ankles 2x10 BIL Step up 6" with march 2x10 BIL 10#  KB in opposite hand Omega knee flexion 35# 2x10 Omega knee extension 10# 2x10 Slant board gastroc stretch x2' Supine clamshell GTB 2x10 Supine hip fallouts GTB x10 Bridge w/ball 2x10 PF against wall 2x10 DF against wall x15  [facility] Adult PT Treatment:                                                DATE: 12/08/22 Therapeutic Exercise: Nustep L2 8 min Quad sets 3s 15x B Supine SLR 2# 15x B S/L clamshell x 15 each GTB S/L hip abd x 15 B Supine hip fallouts GTB 15x Bridge w/ball 15x SAQs 2# 15/15 Bridge against GTB 15x FAQs with adduction15x PF against wall 15x DF against wall 15x March against wall 15/15   [facility] Adult PT Treatment:                                                DATE: 12/03/2022 Therapeutic Exercise: Quad sets x 5 - 5" hold Supine SLR x 5 each S/L clamshell x 5 each GTB S/L hip abd x 5 each Bridge x 5   PATIENT EDUCATION:  Education details: eval findings, FOTO, HEP, POC Person educated: Patient Education method: Explanation, Demonstration, and Handouts Education comprehension: verbalized understanding and returned demonstration   HOME EXERCISE PROGRAM: Access Code: [email or web address] URL: [email or web address] Date: 12/03/2022 Prepared by: Octavio Manns   Exercises - Supine Quadricep Sets  - 1 x daily - 7 x weekly - 2 sets - 10 reps - 5 sec hold - Active Straight Leg Raise with Quad Set  - 1 x daily - 7 x weekly - 3 sets - 10 reps - Clamshell with Resistance  - 1 x daily - 7 x weekly - 3 sets - 10 reps - green theraband hold - Sidelying Hip Abduction  - 1 x daily - 7 x weekly - 3 sets - 10 reps - Supine Bridge  - 1 x daily - 7 x weekly - 3 sets - 10 reps   ASSESSMENT:   CLINICAL IMPRESSION: Today's session increased weight and resistance as noted to challenge patient in preparation for potential DC.  She was able to complet all requested tasks w/o  symptom exacerbation.  Advanced to [facility] eccentric strengthening w/o setback to emphasize TKE.  Will assess progress for possible DC in one week if patient agreeable to self management.    OBJECTIVE IMPAIRMENTS: decreased activity tolerance, decreased endurance, decreased strength, and pain.    ACTIVITY LIMITATIONS: standing, squatting, and stairs   PARTICIPATION LIMITATIONS: community activity, occupation, and yard work   PERSONAL FACTORS: Time since onset of injury/illness/exacerbation are also affecting patient's functional outcome.    REHAB POTENTIAL: Excellent   CLINICAL DECISION MAKING: Stable/uncomplicated   EVALUATION COMPLEXITY: Low     GOALS: Goals reviewed with patient? No   SHORT TERM GOALS: Target date: 12/24/2022   Pt will be compliant and knowledgeable with initial HEP for improved comfort and carryover Baseline: initial HEP given  Goal status: INITIAL   2.  Pt will self report bilateral knee pain no greater than 5/10 for improved comfort and functional ability Baseline: 8/10 at worst Goal status: INITIAL    LONG TERM GOALS: Target date: 01/28/2023   Pt will improve  FOTO function score to no less than 79% as proxy for functional improvement Baseline: 71% function Goal status: INITIAL    2.  Pt will increase 30 Second Sit to Stand rep count to no less than 12 reps with no increase in pain for improved balance, strength, and functional mobility Baseline: 10 reps with pain Goal status: INITIAL    3.  Pt will self report bilateral knee pain no greater than 2/10 for improved comfort and functional ability Baseline: 8/10 at worst Goal status: INITIAL    4.  Pt will improve all LE MMT to no less than 5/5 for improved comfort with functional mobiliity Baseline: see chart Goal status: INITIAL   5.  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

## 2022-12-25 ENCOUNTER — Ambulatory Visit: Payer: Commercial Managed Care - PPO

## 2023-01-22 ENCOUNTER — Encounter: Payer: Self-pay | Admitting: Internal Medicine

## 2023-01-22 ENCOUNTER — Ambulatory Visit (INDEPENDENT_AMBULATORY_CARE_PROVIDER_SITE_OTHER): Payer: Commercial Managed Care - PPO | Admitting: Internal Medicine

## 2023-01-22 VITALS — BP 110/70 | HR 76 | Ht 65.0 in | Wt 177.0 lb

## 2023-01-22 DIAGNOSIS — E221 Hyperprolactinemia: Secondary | ICD-10-CM

## 2023-01-22 DIAGNOSIS — E0590 Thyrotoxicosis, unspecified without thyrotoxic crisis or storm: Secondary | ICD-10-CM

## 2023-01-22 DIAGNOSIS — E042 Nontoxic multinodular goiter: Secondary | ICD-10-CM

## 2023-01-22 NOTE — Patient Instructions (Signed)
Please stop at the lab.  We will schedule a new thyroid U/S for you.  You should have an endocrinology follow-up appointment in 1 year.

## 2023-01-22 NOTE — Progress Notes (Addendum)
Patient ID: [name], female   DOB: [birth year], 47 y.o.   MRN: Crystal Mcintyre  HPI  Crystal Mcintyre is a 47 y.o.-year-old female, returning for follow-up for thyrotoxicosis, multinodular goiter.  She previously saw Dr. Loanne Mcintyre, last visit 2.5 years ago.  She was diagnosed with thyrotoxicosis in 2010.  She was previously on methimazole for 6 months, but then was able to come off due to normal thyroid tests.  I reviewed pt's thyroid tests: Lab Results  Component Value Date   TSH 0.91 06/21/2020   TSH 2.00 10/21/2019   TSH 1.20 12/02/2018   TSH 2.36 09/29/2017   TSH 1.13 03/17/2017   TSH 1.07 09/17/2016   TSH 1.00 03/13/2016   TSH 0.74 09/14/2015   TSH 0.56 03/09/2015   TSH 0.50 03/28/2014   FREET4 0.78 06/21/2020   FREET4 0.56 (L) 10/21/2019   FREET4 0.64 12/02/2018   FREET4 0.71 03/09/2015   FREET4 0.50 (L) 03/28/2014   FREET4 0.41 (L) 10/31/2013   FREET4 1.30 08/26/2013   FREET4 0.6 01/30/2010   FREET4 0.6 01/10/2009   FREET4 0.5 (L) 12/04/2008   T3FREE 4.6 (H) 08/26/2013   T3FREE 2.7 01/30/2010   T3FREE 2.9 01/10/2009   Antithyroid antibodies: No results found for: "TSI"  She has: - excessive sweating/heat intolerance -she has very frequent hot flashes during the day and also during the night and can barely rest.  She feels she had not slept well in 2 to 3 years.  She is using gabapentin but without significant results.  No: - tremors - palpitations - hyperdefecation - weight loss  She has a history of multinodular goiter:  Reviewed previous thyroid ultrasound reports: Thyroid U/S (04/17/2009): The thyroid gland is diffusely enlarged.  The right lobe  measures 8.1 cm sagittally with a depth of 1.7 cm and width of 2.5  cm.  The left lobe measures 8.6 x 2.2 x 3.1 cm.  The isthmus is  thickened at 8.1 mm.  There is a solid nodule in the lower pole on  the right of 6 x 5 x 6 mm.  A solid nodule is noted in the left mid  lobe of 8 x 6 x 11 mm.  Follow-up  ultrasound is recommended to  assess stability of these nodules.    IMPRESSION:  Two small solid nodules as described above.  The thyroid gland is  diffusely enlarged.   Thyroid U/S (05/07/2010): Stable thyromegaly is seen.  Right lobe measures 8.1 cm  long X 2.4 cm AP X 2.8 cm wide and the left lobe 8.3 cm long X 2.3  cm AP X 3.4 cm wide with 6 mm AP thickness of the isthmus.  Thyroid  echotexture is diffusely homogeneous.  At the mid to lower pole  left lobe is persistent marginated hypoechoic solid nodule  measuring 1.1 cm long X 0.7 cm AP X 0.8 cm wide (04/17/2009 1.1 X  0.6 X 0.8 cm).  Interval small isthmus nodule measures 6 mm with no  persistent right lobe solid nodule visualized.    IMPRESSION:   Stable thyromegaly with interval nonvisualization previous  subcentimeter inferior right lobe and current 6 mm isthmus solid  nodule and stable 1.1 cm left lobe nodule.   Thyroid U/S (03/23/2015): Right thyroid lobe   Measurements: 5.6 cm x 2.2 cm x 2.4 cm. Single nodule at the inferior aspect of the right thyroid measuring 5 mm x 4 mm x 4 mm with solid features.   Left thyroid lobe  Measurements: 8.3 cm x 2.2 cm x 2.8 cm. Two nodules on the left measuring 7 mm x 4 mm x 6 mm upper, as well as inferior nodule measuring 11 mm x 8 mm x 12 mm. Both of these are solid, neither with internal reflectors to indicate calcium.   Isthmus Thickness: 7 mm.  No nodules visualized.   Lymphadenopathy - None visualized.   IMPRESSION: Multinodular thyroid.  None of the nodules meet criteria for biopsy.   Follow-up by clinical exam is recommended. If patient has known risk factors for thyroid carcinoma, consider follow-up ultrasound in 12 months. If patient is clinically hyperthyroid, consider nuclear medicine thyroid uptake and scan  Thyroid U/S (10/31/2019): Parenchymal Echotexture: Mildly heterogenous  Isthmus: 0.4 cm  Right lobe: 8.1 x 1.9 x 2.7 cm  Left lobe: 7.9 x 2.1 x 2.8  cm  _________________________________________________________   Estimated total number of nodules >/= 1 cm: 3 _________________________________________________________   Nodule # 1:  Prior biopsy: No  Location: Right; Inferior  Maximum size: 1 cm; Other 2 dimensions: 0.9 x 0.7 cm, previously, 0.6 x 0.6 x 0.5 cm  Composition: solid/almost completely solid (2)  Echogenicity: isoechoic (1) Significant change in size (>/= 20% in two dimensions and minimal increase of 2 mm): Yes Given size (<1.4 cm) and appearance, this nodule does NOT meet TI-RADS criteria for biopsy or dedicated follow-up.  _________________________________________________________   Nodule # 2:  Prior biopsy: No  Location: Left; Mid  Maximum size: 1.4 cm; Other 2 dimensions: 1.1 x 0.9 cm, previously, 1.1 x 0.9 x 0.7 cm  Composition: solid/almost completely solid (2)  Echogenicity: isoechoic (1) Echogenic foci: punctate echogenic foci (3) Significant change in size (>/= 20% in two dimensions and minimal increase of 2 mm): Yes *Given size (>/= 1 - 1.4 cm) and appearance, a follow-up ultrasound in 1 year should be considered based on TI-RADS criteria.  _________________________________________________________   There is a 1.4 x 0.5 x 0.5 cm hypoechoic nodule posterior to the thyroid gland on the left as favored to represent a parathyroid gland.   IMPRESSION: 1. Slight interval growth of a TR 4 thyroid nodule in the left mid thyroid gland. One year follow-up ultrasound is recommended. 2. 1 cm nodule in the right inferior thyroid gland the does not meet criteria for follow-up or FNA.  Pt does have a FH of thyroid ds. - goiters. No FH of thyroid cancer. No h/o radiation tx to head or neck. No seaweed or kelp, no recent contrast studies. No steroid use. No herbal supplements. No Biotin use.  Pt. also has a history of hyperprolactinemia: -Per review of Dr. Cordelia Mcintyre notes, this was diagnosed in 2016  Reviewed  previous prolactin levels: Lab Results  Component Value Date   PROLACTIN 10.0 06/21/2020   PROLACTIN 8.7 10/21/2019   PROLACTIN 15.3 12/02/2018   PROLACTIN 11.6 09/29/2017   PROLACTIN 14.2 03/17/2017   PROLACTIN 17.2 09/17/2016   PROLACTIN 17.8 03/13/2016   PROLACTIN 15.5 09/14/2015   PROLACTIN 6.3 01/30/2010   Brain MRI (02/08/2016): Normal pituitary gland.  She has been on bromocriptine in the past, but off since 2020 with normal results.  No breast discharge.  She also has a history of perimenopause/irregular menstrual cycles, anxiety, GERD, gallbladder disease, osteoarthritis/knee pain. She also had bilateral uterine artery embolization for fibroids.  ROS: + see HPI  Past Medical History:  Diagnosis Date   Gastric ulcer    GERD (gastroesophageal reflux disease)    GOITER, MULTINODULAR 04/18/2009   Headache(784.0)  06/17/2007   IRREGULAR MENSTRUATION 06/17/2007   Migraines    Other anxiety states 01/30/2010   Scoliosis    THYROID STIMULATING HORMONE, ABNORMAL 01/10/2009   TOBACCO USE 01/10/2008   TRANSAMINASES, SERUM, ELEVATED 01/10/2009   Past Surgical History:  Procedure Laterality Date   CARPAL TUNNEL RELEASE Right 09/08/2022   Procedure: RIGHT CARPAL TUNNEL RELEASE;  Surgeon: Leanora Cover, MD;  Location: [facility];  Service: Orthopedics;  Laterality: Right;  30 MIN   IR ANGIOGRAM PELVIS SELECTIVE OR SUPRASELECTIVE  10/28/2021   IR ANGIOGRAM PELVIS SELECTIVE OR SUPRASELECTIVE  10/28/2021   IR ANGIOGRAM SELECTIVE EACH ADDITIONAL VESSEL  10/28/2021   IR ANGIOGRAM SELECTIVE EACH ADDITIONAL VESSEL  10/28/2021   IR EMBO TUMOR ORGAN ISCHEMIA INFARCT INC GUIDE ROADMAPPING  10/28/2021   IR RADIOLOGIST EVAL & MGMT  07/24/2021   IR RADIOLOGIST EVAL & MGMT  11/28/2021   IR RADIOLOGIST EVAL & MGMT  05/22/2022   IR US GUIDE VASC ACCESS LEFT  10/28/2021   IR US GUIDE VASC ACCESS RIGHT  10/28/2021   MASS EXCISION Right 07/04/2021   Procedure: RIGHT THUMB EXCISION  MUCOID CYST AND DEBRIDEMENT INTERPHALANGEAL JOINT ARTHRITIS;  Surgeon: Leanora Cover, MD;  Location: [facility];  Service: Orthopedics;  Laterality: Right;   TONSILLECTOMY  as child   TYMPANOPLASTY  4 yrs ago   TYMPANOSTOMY TUBE PLACEMENT  as child   UPPER GASTROINTESTINAL ENDOSCOPY     Social History   Socioeconomic History   Marital status: Married    Spouse name: Not on file   Number of children: Not on file   Years of education: Not on file   Highest education level: Not on file  Occupational History   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      Social Determinants of Health   Financial Resource Strain: Not on file  Food Insecurity: Not on file  Transportation Needs: Not on file  Physical Activity: Not on file  Stress: Not on file  Social Connections: Not on file  Intimate Partner Violence: Not on file   Current Outpatient Medications on File Prior to Visit  Medication Sig Dispense Refill   gabapentin (NEURONTIN) 100 MG capsule Take 2-3 capsules (200-300 mg total) by mouth at bedtime. 90 capsule 11   HYDROcodone-acetaminophen (NORCO/VICODIN) 5-325 MG tablet 1-2 tabs PO q6 hours prn pain (Patient not taking: Reported on 12/03/2022) 15 tablet 0   meloxicam (MOBIC) 15 MG tablet Take 1 tablet (15 mg total) by mouth daily. 30 tablet 0    Multiple Vitamin (MULTI VITAMIN DAILY PO) Take 2 tablets by mouth daily.     pantoprazole (PROTONIX) 40 MG tablet TAKE 1 TABLET (40 MG TOTAL) BY  MOUTH TWICE A DAY BEFORE MEALS 180 tablet 1   No current facility-administered medications on file prior to visit.   Allergies  Allergen Reactions   Excedrin Extra Strength [Asa-Apap-Caff Buffered] Other (See Comments)    Burns chest ( can take with omeprazole)   Family History  Problem Relation Age of Onset   Colon polyps Mother    Pneumonia Father    Alcohol abuse Father    Breast cancer Paternal Aunt    Breast cancer Paternal Aunt    Diabetes Maternal Grandfather    Prostate cancer Paternal Grandfather    Rectal cancer Neg Hx    Stomach cancer Neg Hx    Colon cancer Neg Hx    Esophageal cancer Neg Hx    PE: BP 110/70   Pulse 76   Ht '5\' 5"'$  (1.651 m)   Wt 177 lb (80.3 kg)   SpO2 98%   BMI 29.45 kg/m  Wt Readings from Last 3 Encounters:  01/22/23 177 lb (80.3 kg)  12/16/22 181 lb (82.1 kg)  11/25/22 172 lb (78 kg)   Constitutional: slightly overweight, in NAD Eyes: no exophthalmos, no lid lag, no stare ENT: no thyromegaly, no cervical lymphadenopathy Cardiovascular: RRR, No MRG Respiratory: CTA B Musculoskeletal: no deformities Skin: moist, warm, no rashes Neurological: no tremor with outstretched hands, DTR normal in all 4  ASSESSMENT: 1. H/o Thyrotoxicosis  2.  Multinodular goiter  3.  Hot flashes  PLAN:  1. Patient with a h/o low TSH, with thyrotoxic sxs: weight loss, heat intolerance, hyperdefecation, palpitations, anxiety.  - she does not appear to have exogenous causes for the low TSH.  - We discussed that possible causes of thyrotoxicosis are:  Graves ds   Thyroiditis toxic multinodular goiter/ toxic adenoma - will check the TSH, fT3 and fT4 and also add thyroid stimulating antibodies to screen for Graves' disease.  - If the tests become abnormal, we may need an uptake and scan to differentiate between the  3 above possible etiologies  - I do not feel that we need to add beta blockers at this time, since she is not tachycardic or tremulous -If the tests are abnormal, she can have a TSH checked every year, but no further investigation or more intense follow-up is needed  2. Multinodular goiter -Patient with long history of goiter with several small thyroid nodules -We reviewed together previous ultrasound reports and also reviewed together the images and report of the latest ultrasound from 2020.  I explained that the entire thyroid gland appears to be enlarged, but the size was stable since 2010.  She does feel that the neck has gone down in size since then.   -She has 2 small thyroid nodules: 1 in the right thyroid lobe, which is approximately stable in size and does not appear to be worrisome and 1 in the left thyroid lobe appears to have increased slightly on the last ultrasound from 2020 and also appears to have hyperechoic punctate foci, questionable colloid granules.  This nodule qualified for follow-up in 2020. -At today's visit, we discussed about repeating a thyroid ultrasound and see if any intervention is needed for this nodule.  She agrees with this plan.  3.  Hot flashes -Patient is having a very hard time with frequent hot flashes day and night.  She has not had a menstrual cycle for 1 year.  However, her symptoms started approximately 3 years ago.  Gabapentin is not helping. -I advised her to check with her OB/GYN  doctor to see if she is a candidate for HRT.  If so, we discussed that this can prove her quality of life, improving sleep and also reducing or even eliminating hot flashes. -We did discuss that sometimes hot flashes could be related to thyrotoxicosis, however, she does not have other signs of thyrotoxicosis.  I advised her to wait for the results of the thyroid tests to come back before contacting Dr. Garwin Brothers.  Orders Placed This Encounter  Procedures   US THYROID   TSH    T4, free   T3, free   - Total time spent for the visit: 40 min, in precharting, postcharting, reviewing Dr. Cordelia Mcintyre last note, obtaining medical information from the chart and from the pt, reviewing her  previous labs, evaluations, and treatments, reviewing her symptoms, counseling her about her endocrine conditions (please see the discussed topics above), and developing a plan to further evaluate and treat them.  Component     Latest Ref Rng 01/22/2023  TSH     0.35 - 5.50 uIU/mL 1.10   T4,Free(Direct)     0.60 - 1.60 ng/dL 0.52 (L)   Triiodothyronine,Free,Serum     2.3 - 4.2 pg/mL 3.5     Thyroid tests are normal with the exception of a very slightly low free T4.  No intervention is needed for this.  Will recheck her thyroid tests when she comes back in a year.  Thyroid U/S (02/26/2023): Parenchymal Echotexture: Mildly heterogenous  Isthmus: 0.9 cm  Right lobe: 6.1 x 2.0 x 2.8 cm  Left lobe: 6.8 x 2.2 x 2.9 cm  _________________________________________________________   Estimated total number of nodules >/= 1 cm: 2 _________________________________________________________   Nodule # 3:  Location: LEFT; Mid  Maximum size: 1.4 cm; Other 2 dimensions: 1.2 x 1.0 cm, previously 1.4 x 1.1 x 0.9 cm  Composition: solid/almost completely solid (2)  Echogenicity: hypoechoic (2) *Given size (>/= 1 - 1.4 cm) and appearance, a follow-up ultrasound in 1 year should be considered based on TI-RADS criteria.  _________________________________________________________   Additional nodules, such as labeled # 1 (0.8 cm isthmus solid mildly hypoechoic, TR-4 and # 2 (1.1 cm RIGHT inferior solid isoechoic, TR-3) do not meet threshold for follow-up nor biopsy per current criteria.   No cervical adenopathy or abnormal fluid collection within the imaged neck.   IMPRESSION: 1. Borderline enlarged, heterogeneous gland with multiple nodules, consistent with a goiter. 2. Stable size and appearance  of 1.4 cm LEFT mid TR-4 thyroid nodule. A follow-up ultrasound in 1 year should be considered based on TI-RADS criteria. Next years evaluation marks 5 years of stability.   Crystal Kingdom, MD PhD [facility]

## 2023-01-23 LAB — TSH: TSH: 1.1 u[IU]/mL (ref 0.35–5.50)

## 2023-01-23 LAB — T4, FREE: Free T4: 0.52 ng/dL — ABNORMAL LOW (ref 0.60–1.60)

## 2023-01-23 LAB — T3, FREE: T3, Free: 3.5 pg/mL (ref 2.3–4.2)

## 2023-02-07 ENCOUNTER — Other Ambulatory Visit: Payer: Self-pay | Admitting: Internal Medicine

## 2023-02-26 ENCOUNTER — Ambulatory Visit
Admission: RE | Admit: 2023-02-26 | Discharge: 2023-02-26 | Disposition: A | Discharge status: Home / Self Care | Payer: Commercial Managed Care - PPO | Source: Ambulatory Visit | Attending: Internal Medicine | Admitting: Internal Medicine

## 2023-02-26 DIAGNOSIS — E042 Nontoxic multinodular goiter: Secondary | ICD-10-CM

## 2023-03-21 ENCOUNTER — Other Ambulatory Visit: Payer: Self-pay | Admitting: Internal Medicine

## 2023-07-11 IMAGING — US US ABDOMEN LIMITED
1 series · 14 of 25 positions shown · non-contrast
Comparison: Abdominal ultrasound dated 03/02/2013.

CLINICAL DATA: History of gallbladder polyp.

EXAM:
ULTRASOUND ABDOMEN LIMITED RIGHT UPPER QUADRANT

[Series 1: us abdomen limited · 14 of 70 slices shown]
[im 1/70]
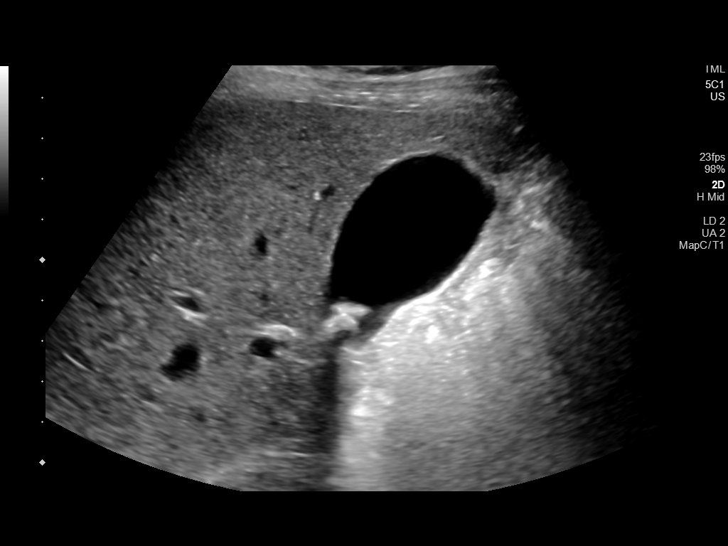
[im 6/70]
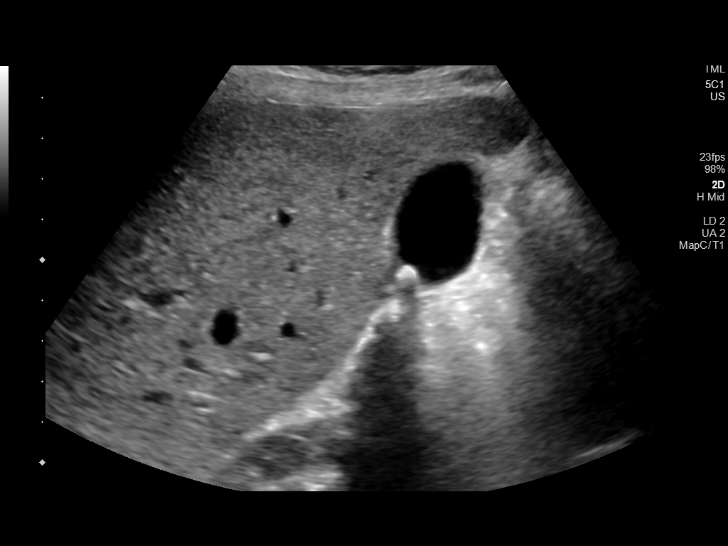
[im 12/70]
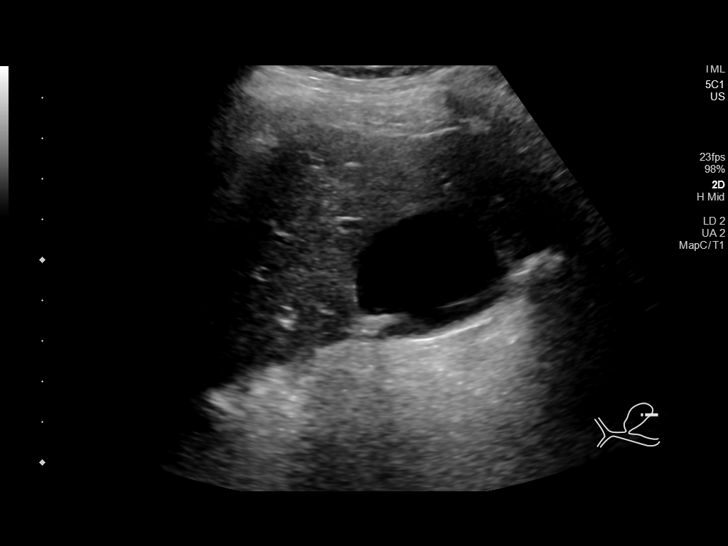
[im 18/70]
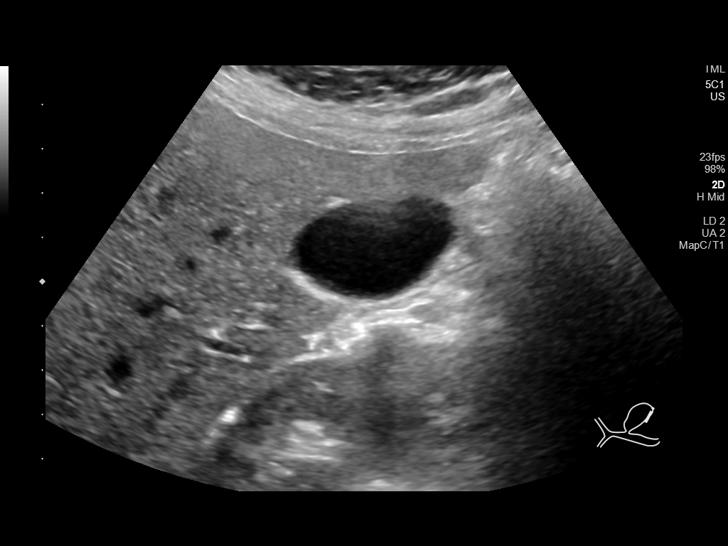
[im 24/70]
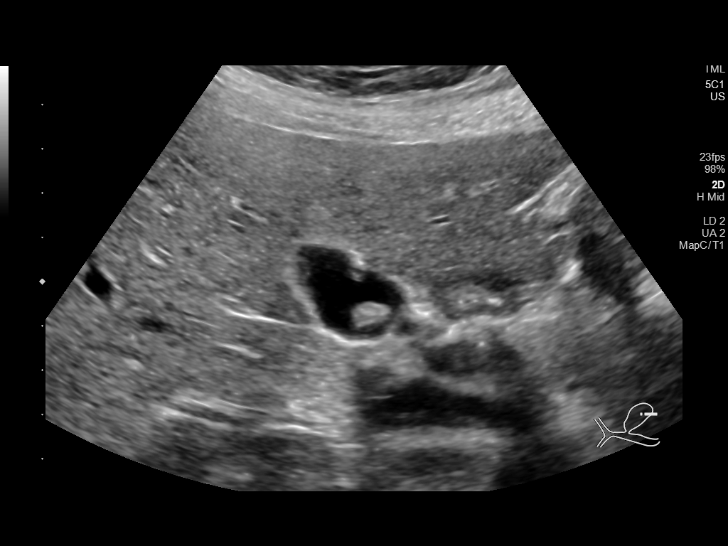
[im 26/70]
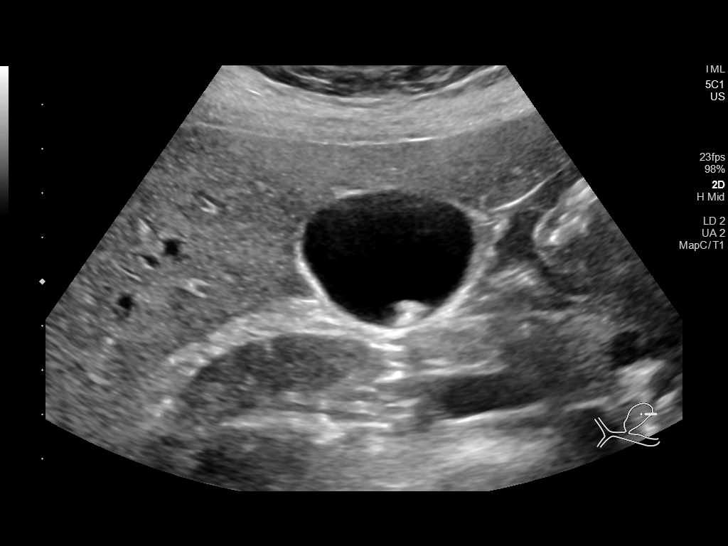
[im 32/70]
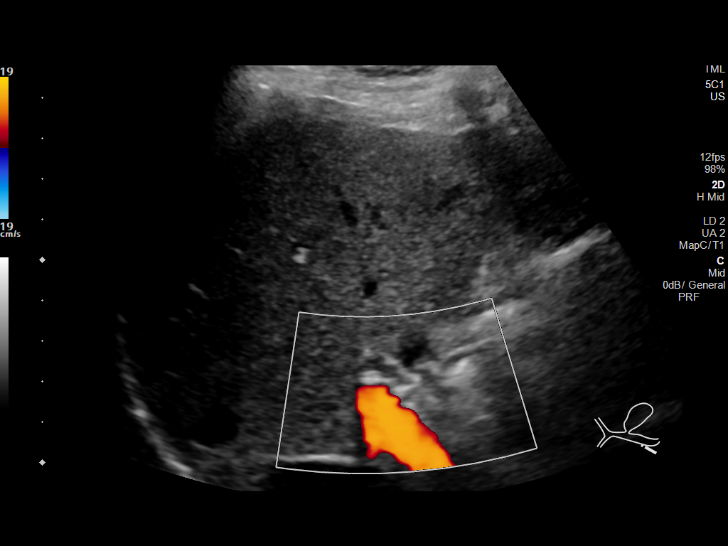
[im 38/70]
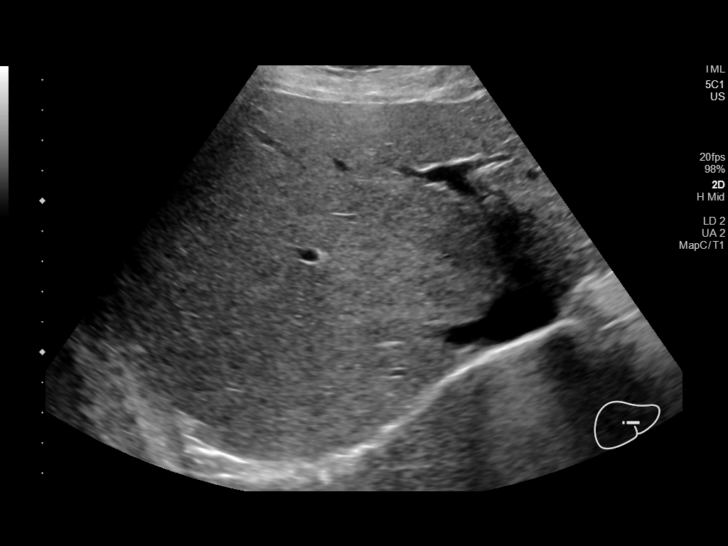
[im 44/70]
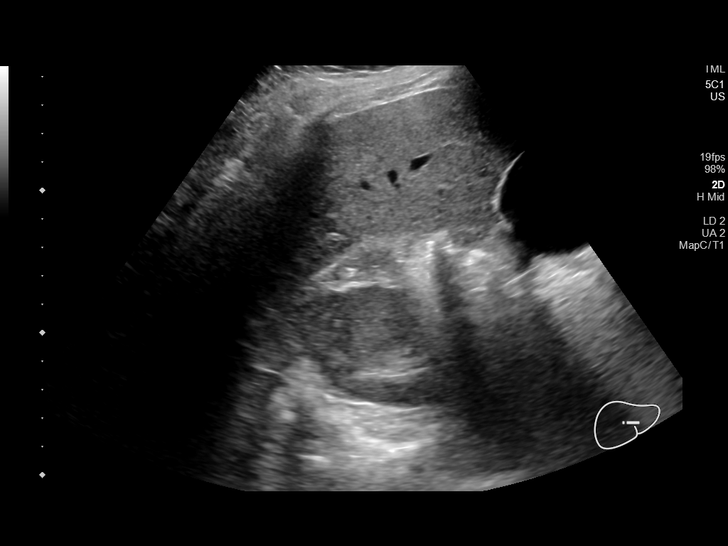
[im 47/70]
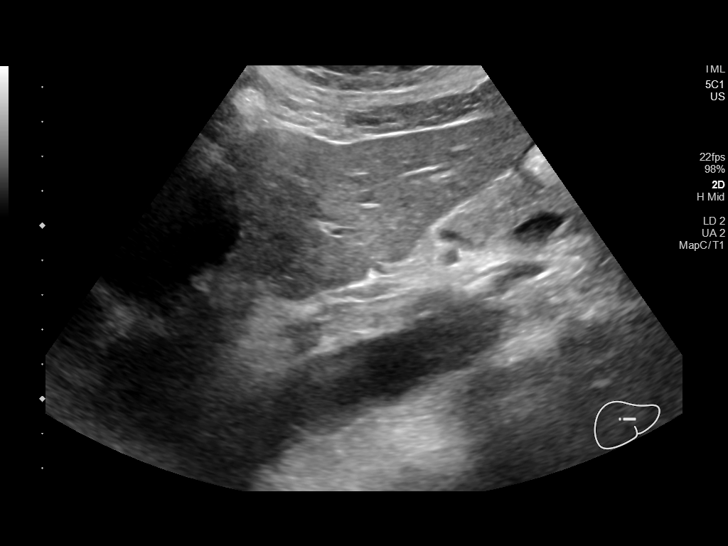
[im 52/70]
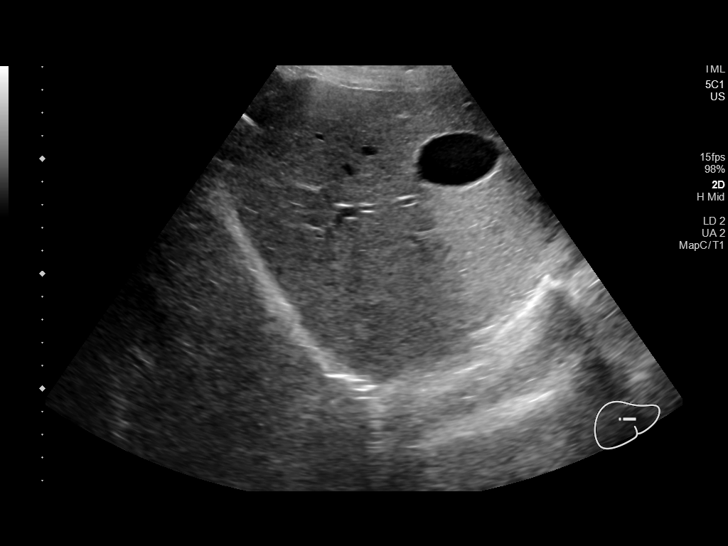
[im 58/70]
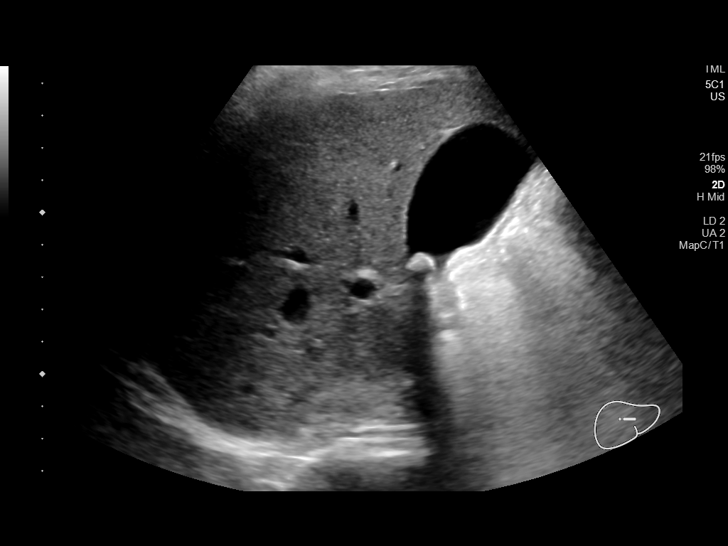
[im 64/70]
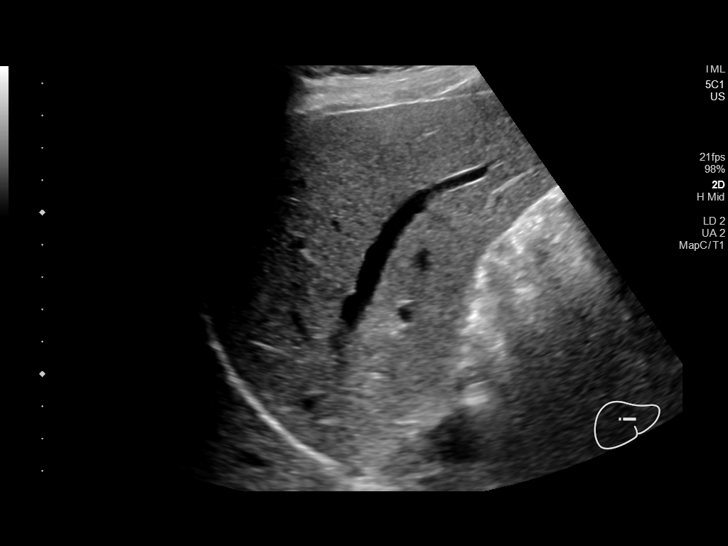
[im 70/70]
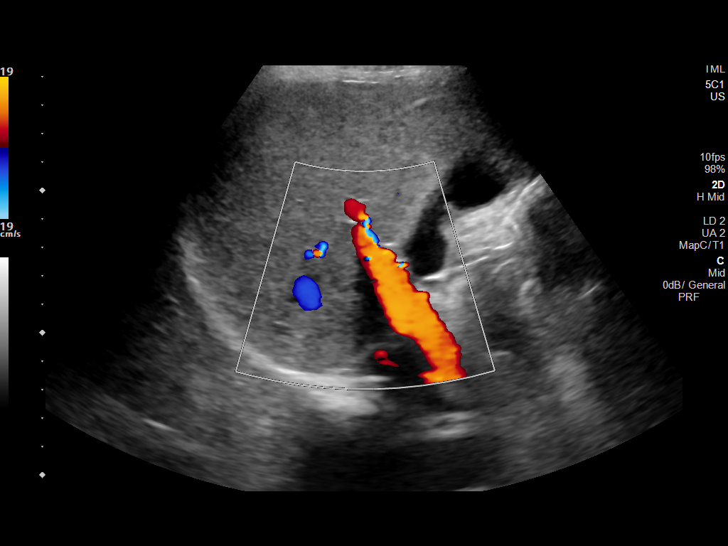

[14 of 25 positions shown; findings below may reference images not displayed]

FINDINGS: Gallbladder:

Multiple shadowing gallstones are noted. No gallbladder polyp is
identified. No wall thickening visualized. No sonographic Murphy
sign noted by sonographer.

Common bile duct:

Diameter: 5 mm

Liver:

No focal lesion identified. Slightly increased parenchymal
echogenicity. Portal vein is patent on color Doppler imaging with
normal direction of blood flow towards the liver.

Other: None.
IMPRESSION: 1. Cholelithiasis without evidence of acute cholecystitis.
2. No gallbladder polyp is identified on today's exam.
3. Slightly increased liver echogenicity may reflect hepatic
steatosis.

## 2023-08-05 ENCOUNTER — Other Ambulatory Visit: Payer: Self-pay | Admitting: Internal Medicine

## 2023-08-07 ENCOUNTER — Other Ambulatory Visit: Payer: Self-pay

## 2023-08-07 ENCOUNTER — Ambulatory Visit: Payer: Commercial Managed Care - PPO | Admitting: Sports Medicine

## 2023-08-07 ENCOUNTER — Encounter: Payer: Self-pay | Admitting: Sports Medicine

## 2023-08-07 DIAGNOSIS — M47816 Spondylosis without myelopathy or radiculopathy, lumbar region: Secondary | ICD-10-CM

## 2023-08-07 DIAGNOSIS — K219 Gastro-esophageal reflux disease without esophagitis: Secondary | ICD-10-CM | POA: Diagnosis not present

## 2023-08-07 DIAGNOSIS — M545 Low back pain, unspecified: Secondary | ICD-10-CM

## 2023-08-07 DIAGNOSIS — G8929 Other chronic pain: Secondary | ICD-10-CM

## 2023-08-07 DIAGNOSIS — M5136 Other intervertebral disc degeneration, lumbar region: Secondary | ICD-10-CM

## 2023-08-07 MED ORDER — CELECOXIB 200 MG PO CAPS: 200.0000 mg | ORAL_CAPSULE | Freq: Every day | ORAL | 0 refills | Status: AC | PRN

## 2023-08-07 MED ORDER — METHYLPREDNISOLONE 4 MG PO TBPK: ORAL_TABLET | ORAL | 0 refills | Status: DC

## 2023-08-07 NOTE — Progress Notes (Signed)
Crystal Mcintyre - 47 y.o. female MRN [id number]  Date of birth: 04/30/1976  Office Visit Note: Visit Date: 08/07/2023 PCP: Crystal Headings, MD Referred by: Crystal Headings, MD  Subjective: Chief Complaint  Patient presents with   Lower Back - Pain    Chronic pain across lower back and worsening. Only thing that helps is rest. Back catches with getting out of car. Cannot vacuum or do other chores in which she must bend the waist. Has pain radiating down posterior leg to ankle/foot sometimes.   HPI: Crystal Mcintyre is a pleasant 47 y.o. female who presents today for acute on chronic low back pain.  She has had low back pain for years, the pain waxes and wanes in severity.  Saw Dr. Prince Mcintyre back in 02/2021 for this - note reviewed today. Referred to PT at [facility], baclofen/Zanaflex qhS. Crystal Mcintyre states when she started physical therapy in the past, get her to do the therapy so she was unable to complete many sessions.  Her pain he had the last few weeks to months has been worsened.  She has pain bending at the waist and getting up from a flexed position.  There is a catching sensation getting in and out of the car.  At times she cannot vacuum or do other chores because of the pain.  Very intermittently she will have a radiating pain down the left low back into the buttock and thigh but this is not consistent.  Also states she is going through menopause and so she will have hot flashes and other vasomotor symptoms.  She gets a soft tissue massage about every 6 weeks which is helpful.  Alternate ibuprofen and Tylenol without significant relief.  She is on gabapentin 300 mg in the evenings which is helpful.  Pertinent ROS were reviewed with the patient and found to be negative unless otherwise specified above in HPI.   Assessment & Plan: Visit Diagnoses:  1. Chronic bilateral low back pain, unspecified whether sciatica present   2. Lumbar facet arthropathy   3. DDD (degenerative disc disease),  lumbar   4. Gastroesophageal reflux disease, unspecified whether esophagitis present    Plan: Discussed with Crystal Mcintyre she does have some degenerative disc disease at the lower lumbar spine as well as facet arthropathy.  I think both of these are exacerbated.  Over the last few weeks, as she does have worsening pain with extension and going from bending to sitting upright.  In the past she was unable to do thorough physical therapy as her pain was too great.  We will refer her to aquatic-based physical therapy at [facility], she may start land-based therapy in the interim if she wishes.  We will allow her to take Celebrex 200 mg once daily only as needed prior to her therapy or on breakthrough days for pain, but does not need to take this consistently.  Will choose Celebrex given her history of GERD.  For the short-term, she will take a methylprednisolone pack for 6 days and then discontinue.  We will follow-up in about 6 weeks and see her response to therapy and the medication changes.  Follow-up: Return in about 6 weeks (around 09/18/2023) for low back pain (30-min).   Meds & Orders:  Meds ordered this encounter  Medications   methylPREDNISolone (MEDROL DOSEPAK) 4 MG TBPK tablet    Sig: Taper dosing. Take per packet instructions.    Dispense:  1 each    Refill:  0  celecoxib (CELEBREX) 200 MG capsule    Sig: Take 1 capsule (200 mg total) by mouth daily as needed.    Dispense:  30 capsule    Refill:  0    Orders Placed This Encounter  Procedures   XR Lumbar Spine Complete   Ambulatory referral to Physical Therapy     Procedures: No procedures performed      Clinical History: No specialty comments available.  She reports that she quit smoking about 14 years ago. Her smoking use included cigarettes. She has never used smokeless tobacco. No results for input(s): "HGBA1C", "LABURIC" in the last 8760 hours.  Objective:   Vital Signs: There were no vitals taken for this visit.  Physical  Exam  Gen: Well-appearing, in no acute distress; non-toxic CV:  Well-perfused. Warm.  Resp: Breathing unlabored on room air; no wheezing. Psych: Fluid speech in conversation; appropriate affect; normal thought process Neuro: Sensation intact throughout. No gross coordination deficits.   Ortho Exam - Lumbar: There is some generalized TTP at the lower lumbar spine around L5.  No midline spinous process TTP.  There is left greater than right lumbar paraspinal hypertonicity.  There is pain at endrange extension, full range flexion.  Negative modified slump's test.  5/5 strength of bilateral lower extremities.  2/4 bilateral patellar and Achilles DTR.  Imaging: XR Lumbar Spine Complete  Result Date: 08/07/2023 4 views of the lumbar spine including AP, lateral, flexion and extension views were ordered and reviewed by myself.  X-rays demonstrate at least mild degenerative disc disease most notable at the L4-S1 level.  There is facet arthropathy most notable at L5, to lesser degree L4.  No acute fracture or anterior/retrograde listhesis.   Narrative & Impression  CLINICAL DATA:  Follow-up uterine fibroids. Approximately 6 months status post uterine artery embolization.   EXAM: MRI PELVIS WITHOUT AND WITH CONTRAST   TECHNIQUE: Multiplanar multisequence MR imaging of the pelvis was performed both before and after administration of intravenous contrast.   CONTRAST:  7mL GADAVIST GADOBUTROL 1 MMOL/ML IV SOLN   COMPARISON:  08/03/2021   FINDINGS: Lower Urinary Tract: No bladder or urethral abnormality identified.   Bowel:  Unremarkable visualized pelvic bowel loops.   Vascular/Lymphatic: No pathologically enlarged lymph nodes or other significant abnormality.   Reproductive:   -- Uterus: Measures 12.2 x 10.2 by 9.6 cm (volume = 630 cm^3, significantly decreased from 1200 cm3 on prior exam). Nearly all fibroids show interval decrease in size since prior study. Largest in the fundus  currently measures 7.4 x 7.0 cm, compared to 8.2 x 7.6 cm previously.   -- Fibroid contrast enhancement: All fibroids show lack of residual contrast enhancement on today's exam.   -- Right ovary:  Appears normal.  No mass identified.   -- Left ovary:  Appears normal.  No mass identified.   Other: No abnormal free fluid.   Musculoskeletal:  Unremarkable.   IMPRESSION: Significantly decreased overall uterine volume and size of individual fibroids since prior study. Fibroids show no residual contrast enhancement on today's exam.   No adnexal mass or other acute findings identified.     Electronically Signed   By: Danae Orleans M.D.   On: 05/15/2022 16:51    Past Medical/Family/Surgical/Social History: Medications & Allergies reviewed per EMR, new medications updated. Patient Active Problem List   Diagnosis Date Noted   Uterine leiomyoma 10/28/2021   Fibroids 10/28/2021   Hyperprolactinemia (HCC) 09/14/2015   Left low back pain 01/04/2014   Hyperthyroidism 09/17/2013  GERD (gastroesophageal reflux disease) 08/26/2013   Female fertility problem 01/28/2012   Preventative health care 02/12/2011   Gynecological examination 02/12/2011   Insomnia 02/12/2011   OTHER ANXIETY STATES 01/30/2010   GOITER, MULTINODULAR 04/18/2009   TRANSAMINASES, SERUM, ELEVATED 01/10/2009   IRREGULAR MENSTRUATION 06/17/2007   HEADACHE 06/17/2007   Past Medical History:  Diagnosis Date   Gastric ulcer    GERD (gastroesophageal reflux disease)    GOITER, MULTINODULAR 04/18/2009   Headache(784.0) 06/17/2007   IRREGULAR MENSTRUATION 06/17/2007   Migraines    Other anxiety states 01/30/2010   Scoliosis    THYROID STIMULATING HORMONE, ABNORMAL 01/10/2009   TOBACCO USE 01/10/2008   TRANSAMINASES, SERUM, ELEVATED 01/10/2009   Family History  Problem Relation Age of Onset   Colon polyps Mother    Pneumonia Father    Alcohol abuse Father    Breast cancer Paternal Aunt    Breast cancer  Paternal Aunt    Diabetes Maternal Grandfather    Prostate cancer Paternal Grandfather    Rectal cancer Neg Hx    Stomach cancer Neg Hx    Colon cancer Neg Hx    Esophageal cancer Neg Hx    Past Surgical History:  Procedure Laterality Date   CARPAL TUNNEL RELEASE Right 09/08/2022   Procedure: RIGHT CARPAL TUNNEL RELEASE;  Surgeon: Betha Loa, MD;  Location: [facility];  Service: Orthopedics;  Laterality: Right;  30 MIN   IR ANGIOGRAM PELVIS SELECTIVE OR SUPRASELECTIVE  10/28/2021   IR ANGIOGRAM PELVIS SELECTIVE OR SUPRASELECTIVE  10/28/2021   IR ANGIOGRAM SELECTIVE EACH ADDITIONAL VESSEL  10/28/2021   IR ANGIOGRAM SELECTIVE EACH ADDITIONAL VESSEL  10/28/2021   IR EMBO TUMOR ORGAN ISCHEMIA INFARCT INC GUIDE ROADMAPPING  10/28/2021   IR RADIOLOGIST EVAL & MGMT  07/24/2021   IR RADIOLOGIST EVAL & MGMT  11/28/2021   IR RADIOLOGIST EVAL & MGMT  05/22/2022   IR US GUIDE VASC ACCESS LEFT  10/28/2021   IR US GUIDE VASC ACCESS RIGHT  10/28/2021   MASS EXCISION Right 07/04/2021   Procedure: RIGHT THUMB EXCISION MUCOID CYST AND DEBRIDEMENT INTERPHALANGEAL JOINT ARTHRITIS;  Surgeon: Betha Loa, MD;  Location: [facility];  Service: Orthopedics;  Laterality: Right;   TONSILLECTOMY  as child   TYMPANOPLASTY  4 yrs ago   TYMPANOSTOMY TUBE PLACEMENT  as child   UPPER GASTROINTESTINAL ENDOSCOPY     Social History   Occupational History   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

## 2023-09-02 ENCOUNTER — Encounter: Payer: Self-pay | Admitting: Internal Medicine

## 2023-09-02 ENCOUNTER — Ambulatory Visit (INDEPENDENT_AMBULATORY_CARE_PROVIDER_SITE_OTHER): Payer: Commercial Managed Care - PPO | Admitting: Internal Medicine

## 2023-09-02 VITALS — BP 130/88 | HR 80 | Ht 64.0 in | Wt 175.1 lb

## 2023-09-02 DIAGNOSIS — R0789 Other chest pain: Secondary | ICD-10-CM

## 2023-09-02 DIAGNOSIS — K219 Gastro-esophageal reflux disease without esophagitis: Secondary | ICD-10-CM

## 2023-09-02 MED ORDER — PANTOPRAZOLE SODIUM 40 MG PO TBEC: 40.0000 mg | DELAYED_RELEASE_TABLET | Freq: Two times a day (BID) | ORAL | 3 refills | Status: DC

## 2023-09-02 MED ORDER — PANTOPRAZOLE SODIUM 40 MG PO TBEC: 40.0000 mg | DELAYED_RELEASE_TABLET | Freq: Two times a day (BID) | ORAL | 11 refills | Status: DC

## 2023-09-02 NOTE — Patient Instructions (Signed)
We have sent the following medications to your pharmacy for you to pick up at your convenience: pantoprazole 40 mg twice daily.   Please purchase the following medications over the counter and take as directed: Gaviscon as needed.  Discuss possibly starting amitriptyline with Dr. Cherly Hensen or Dr. Fabian Sharp to help with your symptoms of menopause/sleep.  _______________________________________________________  If your blood pressure at your visit was 140/90 or greater, please contact your primary care physician to follow up on this.  _______________________________________________________  If you are age 48 or older, your body mass index should be between 23-30. Your Body mass index is 30.05 kg/m. If this is out of the aforementioned range listed, please consider follow up with your Primary Care Provider.  If you are age 54 or younger, your body mass index should be between 19-25. Your Body mass index is 30.05 kg/m. If this is out of the aformentioned range listed, please consider follow up with your Primary Care Provider.   ________________________________________________________  The [facility] providers would like to encourage you to use [facility] to communicate with providers for non-urgent requests or questions.  Due to long hold times on the telephone, sending your provider a message by [facility] may be a faster and more efficient way to get a response.  Please allow 48 business hours for a response.  Please remember that this is for non-urgent requests.  _______________________________________________________

## 2023-09-02 NOTE — Progress Notes (Signed)
   Subjective:    Patient ID: Crystal Mcintyre, female    DOB: [birth year], 47 y.o.   MRN: [id number]  HPI Crystal Mcintyre is a 47 year old female with a history of GERD with inflammation proven by biopsy, previous NSAID induced gastric ulcer, history of migraines, anxiety and now menopausal insomnia who is here for follow-up.  She was last here in May 2023 for upper endoscopy and colonoscopy.  She is here alone today.  She has been maintained on pantoprazole 40 mg twice daily.  This has helped tremendously with her heartburn and atypical chest pain.  She still lives in fear of the atypical chest pain and so she has maintained a strict diet without caffeine and acidic foods.  If she misses a full day without her PPI she has recurrent heartburn symptoms.  Her bowel movements have been regular.  No abdominal pain.  She is experiencing menopausal sleep disruption.  She has a hard time sleeping and is often up between 10 PM and 4 AM.  She will eat and snack sometimes in the middle of the night but tries to eat bland foods like yogurt and granola.  She is using gabapentin to try to help with her menopausal symptoms.  She takes a low-dose of sertraline 50 mg daily.   Review of Systems As per HPI, otherwise negative  Current Medications, Allergies, Past Medical History, Past Surgical History, Family History and Social History were reviewed in [employer] record.    Objective:   Physical Exam BP 130/88 (BP Location: Left Arm, Patient Position: Sitting, Cuff Size: Normal)   Pulse 80   Ht 5\' 4"  (1.626 m) Comment: height measured without shoes  Wt 175 lb 1 oz (79.4 kg)   BMI 30.05 kg/m  Gen: awake, alert, NAD HEENT: anicteric  Neuro: nonfocal     Assessment & Plan:   47 year old female with a history of GERD with inflammation proven by biopsy, previous NSAID induced gastric ulcer, history of migraines, anxiety and now menopausal insomnia who is here for follow-up.   GERD  with mild esophagitis --PPI responsive.  I do think she can liberalize her diet to some degree but I would recommend that she not lie down for at least 1 hour after liquids and 2 hours after solid food.  We had a long discussion today regarding her reflux including surgical options should they be pursued in the future --Pantoprazole 40 mg once to twice daily; best 30 minutes before a meal; she may be able to drop the second daily dose of pantoprazole --Gaviscon liquid over-the-counter per box instruction for breakthrough atypical chest pain and reflux --Antireflux diet and lifestyle  2.  Colon cancer screening --normal colonoscopy last year, 10-year recall for 2033  3.  Cholelithiasis --asymptomatic at present.  Report of prior gallbladder polyp not present on ultrasound in March 2023  4.  Menopausal/poor sleep --amitriptyline can be used to treat heartburn symptoms that persist despite PPI.  This could perhaps help her sleep but also her menopausal symptoms to some degree.  She can discuss this further with her primary care provider and gynecologist.  1 year follow-up, sooner if needed  30 minutes total spent today including patient facing time, coordination of care, reviewing medical history/procedures/pertinent radiology studies, and documentation of the encounter.

## 2023-09-10 ENCOUNTER — Telehealth: Payer: Self-pay | Admitting: Internal Medicine

## 2023-09-10 NOTE — Telephone Encounter (Signed)
Pt calling back stating she is interested in having the TIF procedure. Does pt need an OV with Dr. Barron Alvine to discuss procedure?

## 2023-09-10 NOTE — Telephone Encounter (Signed)
At the last OV 9/4, Dr. Rhea Belton discussed the TIF procedure and PT would like to proceed with scheduling. Please advise.

## 2023-09-10 NOTE — Telephone Encounter (Signed)
Yes, pls arrange OV with [facility] and desire to discuss TIF

## 2023-09-11 NOTE — Telephone Encounter (Signed)
Pt scheduled to see Dr. Namon Cirri 09/29/23 at 3pm. Left detailed message on pts identified voicemail regarding appt.

## 2023-09-12 ENCOUNTER — Other Ambulatory Visit: Payer: Self-pay | Admitting: Sports Medicine

## 2023-09-29 ENCOUNTER — Ambulatory Visit: Payer: Commercial Managed Care - PPO | Admitting: Gastroenterology

## 2023-10-02 ENCOUNTER — Ambulatory Visit: Payer: Commercial Managed Care - PPO | Admitting: Sports Medicine

## 2023-10-02 ENCOUNTER — Encounter: Payer: Self-pay | Admitting: Sports Medicine

## 2023-11-02 ENCOUNTER — Ambulatory Visit: Payer: Commercial Managed Care - PPO | Admitting: Sports Medicine

## 2023-11-10 ENCOUNTER — Ambulatory Visit (HOSPITAL_BASED_OUTPATIENT_CLINIC_OR_DEPARTMENT_OTHER): Payer: Commercial Managed Care - PPO | Attending: Physical Therapy | Admitting: Physical Therapy

## 2024-01-20 ENCOUNTER — Ambulatory Visit: Payer: Commercial Managed Care - PPO | Admitting: Gastroenterology

## 2024-01-25 ENCOUNTER — Ambulatory Visit: Payer: Commercial Managed Care - PPO | Admitting: Internal Medicine

## 2024-01-25 NOTE — Progress Notes (Deleted)
Patient ID: Crystal Mcintyre, female   DOB: [birth year], 48 y.o.   MRN: [id number]  HPI  Crystal Mcintyre is a 48 y.o.-year-old female, returning for follow-up for thyrotoxicosis, multinodular goiter.  She previously saw Dr. Everardo All, but last visit with me 1 year ago.  Interim history:   Reviewed history:  Thyrotoxicosis - dx'ed in 2010  She was previously on methimazole for 6 months, but then was able to come off due to normal thyroid tests.  I reviewed pt's thyroid tests: Lab Results  Component Value Date   TSH 1.10 01/22/2023   TSH 0.91 06/21/2020   TSH 2.00 10/21/2019   TSH 1.20 12/02/2018   TSH 2.36 09/29/2017   TSH 1.13 03/17/2017   TSH 1.07 09/17/2016   TSH 1.00 03/13/2016   TSH 0.74 09/14/2015   TSH 0.56 03/09/2015   FREET4 0.52 (L) 01/22/2023   FREET4 0.78 06/21/2020   FREET4 0.56 (L) 10/21/2019   FREET4 0.64 12/02/2018   FREET4 0.71 03/09/2015   FREET4 0.50 (L) 03/28/2014   FREET4 0.41 (L) 10/31/2013   FREET4 1.30 08/26/2013   FREET4 0.6 01/30/2010   FREET4 0.6 01/10/2009   T3FREE 3.5 01/22/2023   T3FREE 4.6 (H) 08/26/2013   T3FREE 2.7 01/30/2010   T3FREE 2.9 01/10/2009   Antithyroid antibodies: No results found for: "TSI"  She has: - excessive sweating/heat intolerance -she has very frequent hot flashes during the day and also during the night and can barely rest.  She feels she had not slept well in 2 to 3 years.  She is using gabapentin but without significant results.  No: - tremors - palpitations - hyperdefecation - weight loss  Multinodular goiter:  Reviewed previous thyroid ultrasound reports: Thyroid U/S (04/17/2009): The thyroid gland is diffusely enlarged.  The right lobe  measures 8.1 cm sagittally with a depth of 1.7 cm and width of 2.5  cm.  The left lobe measures 8.6 x 2.2 x 3.1 cm.  The isthmus is  thickened at 8.1 mm.  There is a solid nodule in the lower pole on  the right of 6 x 5 x 6 mm.  A solid nodule is noted in the left mid   lobe of 8 x 6 x 11 mm.  Follow-up ultrasound is recommended to  assess stability of these nodules.    IMPRESSION:  Two small solid nodules as described above.  The thyroid gland is  diffusely enlarged.   Thyroid U/S (05/07/2010): Stable thyromegaly is seen.  Right lobe measures 8.1 cm  long X 2.4 cm AP X 2.8 cm wide and the left lobe 8.3 cm long X 2.3  cm AP X 3.4 cm wide with 6 mm AP thickness of the isthmus.  Thyroid  echotexture is diffusely homogeneous.  At the mid to lower pole  left lobe is persistent marginated hypoechoic solid nodule  measuring 1.1 cm long X 0.7 cm AP X 0.8 cm wide (04/17/2009 1.1 X  0.6 X 0.8 cm).  Interval small isthmus nodule measures 6 mm with no  persistent right lobe solid nodule visualized.    IMPRESSION:   Stable thyromegaly with interval nonvisualization previous  subcentimeter inferior right lobe and current 6 mm isthmus solid  nodule and stable 1.1 cm left lobe nodule.   Thyroid U/S (03/23/2015): Right thyroid lobe   Measurements: 5.6 cm x 2.2 cm x 2.4 cm. Single nodule at the inferior aspect of the right thyroid measuring 5 mm x 4 mm x 4 mm with solid  features.   Left thyroid lobe   Measurements: 8.3 cm x 2.2 cm x 2.8 cm. Two nodules on the left measuring 7 mm x 4 mm x 6 mm upper, as well as inferior nodule measuring 11 mm x 8 mm x 12 mm. Both of these are solid, neither with internal reflectors to indicate calcium.   Isthmus Thickness: 7 mm.  No nodules visualized.   Lymphadenopathy - None visualized.   IMPRESSION: Multinodular thyroid.  None of the nodules meet criteria for biopsy.   Follow-up by clinical exam is recommended. If patient has known risk factors for thyroid carcinoma, consider follow-up ultrasound in 12 months. If patient is clinically hyperthyroid, consider nuclear medicine thyroid uptake and scan  Thyroid U/S (10/31/2019): Parenchymal Echotexture: Mildly heterogenous  Isthmus: 0.4 cm  Right lobe: 8.1 x 1.9 x  2.7 cm  Left lobe: 7.9 x 2.1 x 2.8 cm  _________________________________________________________   Estimated total number of nodules >/= 1 cm: 3 _________________________________________________________   Nodule # 1:  Prior biopsy: No  Location: Right; Inferior  Maximum size: 1 cm; Other 2 dimensions: 0.9 x 0.7 cm, previously, 0.6 x 0.6 x 0.5 cm  Composition: solid/almost completely solid (2)  Echogenicity: isoechoic (1) Significant change in size (>/= 20% in two dimensions and minimal increase of 2 mm): Yes Given size (<1.4 cm) and appearance, this nodule does NOT meet TI-RADS criteria for biopsy or dedicated follow-up.  _________________________________________________________   Nodule # 2:  Prior biopsy: No  Location: Left; Mid  Maximum size: 1.4 cm; Other 2 dimensions: 1.1 x 0.9 cm, previously, 1.1 x 0.9 x 0.7 cm  Composition: solid/almost completely solid (2)  Echogenicity: isoechoic (1) Echogenic foci: punctate echogenic foci (3) Significant change in size (>/= 20% in two dimensions and minimal increase of 2 mm): Yes *Given size (>/= 1 - 1.4 cm) and appearance, a follow-up ultrasound in 1 year should be considered based on TI-RADS criteria.  _________________________________________________________   There is a 1.4 x 0.5 x 0.5 cm hypoechoic nodule posterior to the thyroid gland on the left as favored to represent a parathyroid gland.   IMPRESSION: 1. Slight interval growth of a TR 4 thyroid nodule in the left mid thyroid gland. One year follow-up ultrasound is recommended. 2. 1 cm nodule in the right inferior thyroid gland the does not meet criteria for follow-up or FNA.  Thyroid U/S (02/26/2023): Parenchymal Echotexture: Mildly heterogenous  Isthmus: 0.9 cm  Right lobe: 6.1 x 2.0 x 2.8 cm  Left lobe: 6.8 x 2.2 x 2.9 cm  _________________________________________________________   Estimated total number of nodules >/= 1 cm:  2 _________________________________________________________   Nodule # 3:  Location: LEFT; Mid  Maximum size: 1.4 cm; Other 2 dimensions: 1.2 x 1.0 cm, previously 1.4 x 1.1 x 0.9 cm  Composition: solid/almost completely solid (2)  Echogenicity: hypoechoic (2) *Given size (>/= 1 - 1.4 cm) and appearance, a follow-up ultrasound in 1 year should be considered based on TI-RADS criteria.  _________________________________________________________   Additional nodules, such as labeled # 1 (0.8 cm isthmus solid mildly hypoechoic, TR-4 and # 2 (1.1 cm RIGHT inferior solid isoechoic, TR-3) do not meet threshold for follow-up nor biopsy per current criteria.   No cervical adenopathy or abnormal fluid collection within the imaged neck.   IMPRESSION: 1. Borderline enlarged, heterogeneous gland with multiple nodules, consistent with a goiter. 2. Stable size and appearance of 1.4 cm LEFT mid TR-4 thyroid nodule. A follow-up ultrasound in 1 year should be considered  based on TI-RADS criteria. Next years evaluation marks 5 years of stability.  Pt does have a FH of thyroid ds. - goiters. No FH of thyroid cancer. No h/o radiation tx to head or neck. No seaweed or kelp, no recent contrast studies. No steroid use. No herbal supplements. No Biotin use.  History of hyperprolactinemia: -Per review of Dr. George Hugh notes, this was diagnosed in 2016  Reviewed previous prolactin levels: Lab Results  Component Value Date   PROLACTIN 10.0 06/21/2020   PROLACTIN 8.7 10/21/2019   PROLACTIN 15.3 12/02/2018   PROLACTIN 11.6 09/29/2017   PROLACTIN 14.2 03/17/2017   PROLACTIN 17.2 09/17/2016   PROLACTIN 17.8 03/13/2016   PROLACTIN 15.5 09/14/2015   PROLACTIN 6.3 01/30/2010   Brain MRI (02/08/2016): Normal pituitary gland.  She has been on bromocriptine in the past, but off since 2020 with normal results.  No breast discharge.  She also has a history of perimenopause/irregular menstrual cycles,  anxiety, GERD, gallbladder disease, osteoarthritis/knee pain. She also had bilateral uterine artery embolization for fibroids.  ROS: + see HPI  Past Medical History:  Diagnosis Date   Gastric ulcer    GERD (gastroesophageal reflux disease)    GOITER, MULTINODULAR 04/18/2009   Headache(784.0) 06/17/2007   IRREGULAR MENSTRUATION 06/17/2007   Migraines    Other anxiety states 01/30/2010   Scoliosis    THYROID STIMULATING HORMONE, ABNORMAL 01/10/2009   TOBACCO USE 01/10/2008   TRANSAMINASES, SERUM, ELEVATED 01/10/2009   Past Surgical History:  Procedure Laterality Date   CARPAL TUNNEL RELEASE Right 09/08/2022   Procedure: RIGHT CARPAL TUNNEL RELEASE;  Surgeon: Betha Loa, MD;  Location: [facility];  Service: Orthopedics;  Laterality: Right;  30 MIN   IR ANGIOGRAM PELVIS SELECTIVE OR SUPRASELECTIVE  10/28/2021   IR ANGIOGRAM PELVIS SELECTIVE OR SUPRASELECTIVE  10/28/2021   IR ANGIOGRAM SELECTIVE EACH ADDITIONAL VESSEL  10/28/2021   IR ANGIOGRAM SELECTIVE EACH ADDITIONAL VESSEL  10/28/2021   IR EMBO TUMOR ORGAN ISCHEMIA INFARCT INC GUIDE ROADMAPPING  10/28/2021   IR RADIOLOGIST EVAL & MGMT  07/24/2021   IR RADIOLOGIST EVAL & MGMT  11/28/2021   IR RADIOLOGIST EVAL & MGMT  05/22/2022   IR US GUIDE VASC ACCESS LEFT  10/28/2021   IR US GUIDE VASC ACCESS RIGHT  10/28/2021   MASS EXCISION Right 07/04/2021   Procedure: RIGHT THUMB EXCISION MUCOID CYST AND DEBRIDEMENT INTERPHALANGEAL JOINT ARTHRITIS;  Surgeon: Betha Loa, MD;  Location: [facility];  Service: Orthopedics;  Laterality: Right;   TONSILLECTOMY  as child   TYMPANOPLASTY  4 yrs ago   TYMPANOSTOMY TUBE PLACEMENT  as child   UPPER GASTROINTESTINAL ENDOSCOPY     Social History   Socioeconomic History   Marital status: Legally Separated    Spouse name: Not on file   Number of children: Not on file   Years of education: Not on file   Highest education level: Not on file  Occupational History    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      Social Drivers of Corporate investment banker Strain: Not on file  Food Insecurity: Not on file  Transportation Needs: Not on file  Physical Activity: Not on file  Stress: Not on file  Social Connections: Not on file  Intimate Partner Violence: Not on file   Current Outpatient Medications on File Prior to Visit  Medication Sig Dispense Refill   gabapentin (NEURONTIN) 100 MG capsule TAKE 2-3 CAPSULES (200-300 MG TOTAL) BY MOUTH AT BEDTIME. 90 capsule 11   Multiple Vitamin (MULTI VITAMIN DAILY PO) Take 2 tablets by mouth daily.     pantoprazole (PROTONIX) 40 MG tablet Take 1 tablet (40 mg total) by mouth 2 (two) times daily. 180 tablet 3   sertraline (ZOLOFT) 50 MG tablet Take 50 mg by mouth daily.     tretinoin (RETIN-A) 0.05 % cream SMARTSIG:Sparingly Topical Every Night     No current facility-administered medications on file prior to visit.   Allergies  Allergen Reactions   Excedrin Extra Strength [Asa-Apap-Caff Buffered] Other (See Comments)    Burns chest ( can take with omeprazole)   Family History  Problem Relation Age of Onset   Colon polyps Mother    Pneumonia Father    Alcohol abuse Father    Breast cancer Paternal Aunt    Breast cancer Paternal Aunt    Diabetes  Maternal Grandfather    Prostate cancer Paternal Grandfather    Rectal cancer Neg Hx    Stomach cancer Neg Hx    Colon cancer Neg Hx    Esophageal cancer Neg Hx    PE: There were no vitals taken for this visit. Wt Readings from Last 3 Encounters:  09/02/23 175 lb 1 oz (79.4 kg)  01/22/23 177 lb (80.3 kg)  12/16/22 181 lb (82.1 kg)   Constitutional: slightly overweight, in NAD Eyes: no exophthalmos, no lid lag, no stare ENT: no thyromegaly, no cervical lymphadenopathy Cardiovascular: RRR, No MRG Respiratory: CTA B Musculoskeletal: no deformities Skin:  no rashes Neurological: no tremor with outstretched hands  ASSESSMENT: 1. H/o Thyrotoxicosis  2.  Multinodular goiter  3.  Hot flashes -Gabapentin was not helping -I advised her to discuss with Dr. Cherly Hensen (OB/GYN) to see if she was a candidate for HRT  PLAN:  1. Patient with history of a low TSH, with thyrotoxic symptoms: Weight loss, heat intolerance, hyperdefecation, palpitations, anxiety.  At last visit, we reviewed possible etiologies including Graves' disease, subacute thyroiditis, and toxic multinodular goiter/toxic adenoma.  At that time, her TSH was actually normal for a long time so we repeated her TFTs but did not investigate further.  Indeed, TSH was again normal along with a normal free T3, but she had a slightly low free T4.  We discussed that no intervention is needed for this.  However working diagnosis is that she could have had an episode of subacute thyroiditis, which resolved. -We will recheck her TFTs today  2. Multinodular goiter -Patient with long history of goiter with several small thyroid nodules.  She had 2 small thyroid nodules, 1 in the right thyroid lobe, stable, not worrisome, and 1 in the left thyroid lobe, which appeared  to have increased slightly, and contained hyperechoic punctate foci, questionable colloid granules.  This nodule qualified for follow-up in 2020. -I reviewed the report of the  latest ultrasound from 02/26/2023.  Only the left medial thyroid nodule required a 1 year follow-up, but no intervention.  The entire thyroid gland appeared to be enlarged, but on the last ultrasound the size was smaller.  She feels  that the size of her goiter has decreased since 2020. -Will recheck her ultrasound now and this marks 5 years follow-up.  If the nodule is stable, no further imaging is needed.  Carlus Pavlov, MD PhD  [facility]

## 2024-05-20 ENCOUNTER — Ambulatory Visit (INDEPENDENT_AMBULATORY_CARE_PROVIDER_SITE_OTHER): Payer: Self-pay | Admitting: Sports Medicine

## 2024-05-20 ENCOUNTER — Encounter: Payer: Self-pay | Admitting: Sports Medicine

## 2024-05-20 DIAGNOSIS — M533 Sacrococcygeal disorders, not elsewhere classified: Secondary | ICD-10-CM

## 2024-05-20 DIAGNOSIS — M419 Scoliosis, unspecified: Secondary | ICD-10-CM

## 2024-05-20 DIAGNOSIS — M545 Low back pain, unspecified: Secondary | ICD-10-CM

## 2024-05-20 DIAGNOSIS — G8929 Other chronic pain: Secondary | ICD-10-CM | POA: Diagnosis not present

## 2024-05-20 DIAGNOSIS — M5136 Other intervertebral disc degeneration, lumbar region with discogenic back pain only: Secondary | ICD-10-CM

## 2024-05-20 NOTE — Progress Notes (Signed)
 She states she tried PT, didn't get any better. Takes Tylenol , will ease for a minute but doesn't feel any difference. No prior injuries. May be worse since the since last visit, on menopause. No prior injections.

## 2024-05-20 NOTE — Progress Notes (Signed)
 Crystal Mcintyre - 48 y.o. female MRN [id number]  Date of birth: 07-Nov-1976  Office Visit Note: Visit Date: 05/20/2024 PCP: Reginal Capra, MD Referred by: Reginal Capra, MD  Subjective: Chief Complaint  Patient presents with   Lower Back - Pain   HPI: Crystal Mcintyre is a pleasant 48 y.o. female who presents today for follow-up of chronic low back pain.  She has had low back pain for many years.  She also states she is right in the think of her menopausal symptoms which she knows does play a role with her back pain and sleep quality.  Recently just started a new medication Bijuva for this and her medical physician wanted her to discontinue Gabapentin  while on this.  Since her last visit last year, she does have access to physical therapy through work so she has tried both land and aquatic PT for a few months. The pool felt good when she was doing it but the day or 2 after she would have increased soreness in the low back.  This will radiate into the buttocks and she has to push or massage the area for relief.  After a long drive previously she did have some radicular symptoms that went down the left hamstring but never down past the knee.  This is not consistent.  She has taken Celebrex  in the past that was not significantly helpful, also uses Tylenol  as needed.  Pertinent ROS were reviewed with the patient and found to be negative unless otherwise specified above in HPI.   Assessment & Plan: Visit Diagnoses:  1. Chronic bilateral low back pain without sciatica   2. Degeneration of intervertebral disc of lumbar region with discogenic back pain   3. Scoliosis of thoracolumbar spine, unspecified scoliosis type   4. SI (sacroiliac) joint dysfunction    Plan: Impression is chronic low back pain with evidence of degenerative disc disease as well as a mild degree of thoracolumbar scoliosis.  More of her pain is localized to the low back, pointing to the SI joint area with radiation into  the buttocks.  She has no true sciatic/radicular symptoms down the legs.  She has had low back pain for years and has failed both land and aquatic-based physical therapy, oral medication.  At this time, she is not interested in injections or any other treatment that can give her relief.  We will move forward with MRI of the lumbar spine to better evaluate and help guide management.  Given her new Bijuva medication, she will discontinue her gabapentin .  Celebrex  100 mg twice daily in the past has not been significantly helpful so she will discontinue this moving forward as well.  Okay for Tylenol , heat as needed.  We discussed continuing to stay active and moving to keep her posterior chain and lumbar back muscles strong.  Once she has her MRI, I will evaluate this and discuss next steps which may include a specific guided injection depending on pathology.  Follow-up: Return for I will message after MRI review to discuss next steps (?Injection).   Meds & Orders: No orders of the defined types were placed in this encounter.   Orders Placed This Encounter  Procedures   MR Lumbar Spine w/o contrast     Procedures: No procedures performed      Clinical History: No specialty comments available.  She reports that she quit smoking about 15 years ago. Her smoking use included cigarettes. She has never used smokeless tobacco. No  results for input(s): "HGBA1C", "LABURIC" in the last 8760 hours.  Objective:    Physical Exam  Gen: Well-appearing, in no acute distress; non-toxic CV: Well-perfused. Warm.  Resp: Breathing unlabored on room air; no wheezing. Psych: Fluid speech in conversation; appropriate affect; normal thought process  Ortho Exam - Low back/SI joint: There is no midline spinous process TTP.  She does have some pain in the low back/SI joint region with endrange flexion as well as extension.  There is 5/5 strength in the lower extremity myotome in the L3-S1 locations.  Negative straight  leg raise bilaterally.  Positive Fortin's point test over the left and right SI joint.  Equivocal Faber's testing.  Imaging:  08/07/23: 4 views of the lumbar spine including AP, lateral, flexion and extension  views were ordered and reviewed by myself.  X-rays demonstrate at least  mild degenerative disc disease most notable at the L4-S1 level.  There is  facet arthropathy most notable at L5, to lesser degree L4.  No acute  fracture or anterior/retrograde listhesis.   Past Medical/Family/Surgical/Social History: Medications & Allergies reviewed per EMR, new medications updated. Patient Active Problem List   Diagnosis Date Noted   Uterine leiomyoma 10/28/2021   Fibroids 10/28/2021   Hyperprolactinemia (HCC) 09/14/2015   Left low back pain 01/04/2014   Hyperthyroidism 09/17/2013   GERD (gastroesophageal reflux disease) 08/26/2013   Female fertility problem 01/28/2012   Preventative health care 02/12/2011   Encounter for gynecological examination 02/12/2011   Insomnia 02/12/2011   OTHER ANXIETY STATES 01/30/2010   GOITER, MULTINODULAR 04/18/2009   TRANSAMINASES, SERUM, ELEVATED 01/10/2009   IRREGULAR MENSTRUATION 06/17/2007   Headache 06/17/2007   Past Medical History:  Diagnosis Date   Gastric ulcer    GERD (gastroesophageal reflux disease)    GOITER, MULTINODULAR 04/18/2009   Headache(784.0) 06/17/2007   IRREGULAR MENSTRUATION 06/17/2007   Migraines    Other anxiety states 01/30/2010   Scoliosis    THYROID  STIMULATING HORMONE, ABNORMAL 01/10/2009   TOBACCO USE 01/10/2008   TRANSAMINASES, SERUM, ELEVATED 01/10/2009   Family History  Problem Relation Age of Onset   Colon polyps Mother    Pneumonia Father    Alcohol abuse Father    Breast cancer Paternal Aunt    Breast cancer Paternal Aunt    Diabetes Maternal Grandfather    Prostate cancer Paternal Grandfather    Rectal cancer Neg Hx    Stomach cancer Neg Hx    Colon cancer Neg Hx    Esophageal cancer Neg Hx     Past Surgical History:  Procedure Laterality Date   CARPAL TUNNEL RELEASE Right 09/08/2022   Procedure: RIGHT CARPAL TUNNEL RELEASE;  Surgeon: Brunilda Capra, MD;  Location: [facility];  Service: Orthopedics;  Laterality: Right;  30 MIN   IR ANGIOGRAM PELVIS SELECTIVE OR SUPRASELECTIVE  10/28/2021   IR ANGIOGRAM PELVIS SELECTIVE OR SUPRASELECTIVE  10/28/2021   IR ANGIOGRAM SELECTIVE EACH ADDITIONAL VESSEL  10/28/2021   IR ANGIOGRAM SELECTIVE EACH ADDITIONAL VESSEL  10/28/2021   IR EMBO TUMOR ORGAN ISCHEMIA INFARCT INC GUIDE ROADMAPPING  10/28/2021   IR RADIOLOGIST EVAL & MGMT  07/24/2021   IR RADIOLOGIST EVAL & MGMT  11/28/2021   IR RADIOLOGIST EVAL & MGMT  05/22/2022   IR US  GUIDE VASC ACCESS LEFT  10/28/2021   IR US  GUIDE VASC ACCESS RIGHT  10/28/2021   MASS EXCISION Right 07/04/2021   Procedure: RIGHT THUMB EXCISION MUCOID CYST AND DEBRIDEMENT INTERPHALANGEAL JOINT ARTHRITIS;  Surgeon: Brunilda Capra, MD;  Location: [facility];  Service: Orthopedics;  Laterality: Right;   TONSILLECTOMY  as child   TYMPANOPLASTY  4 yrs ago   TYMPANOSTOMY TUBE PLACEMENT  as child   UPPER GASTROINTESTINAL ENDOSCOPY     Social History   Occupational History   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

## 2024-05-24 ENCOUNTER — Encounter: Payer: Self-pay | Admitting: Sports Medicine

## 2024-06-01 ENCOUNTER — Ambulatory Visit
Admission: RE | Admit: 2024-06-01 | Discharge: 2024-06-01 | Discharge status: Home / Self Care | Source: Ambulatory Visit | Attending: Sports Medicine

## 2024-06-01 DIAGNOSIS — M5136 Other intervertebral disc degeneration, lumbar region with discogenic back pain only: Secondary | ICD-10-CM

## 2024-06-01 DIAGNOSIS — M545 Low back pain, unspecified: Secondary | ICD-10-CM

## 2024-06-06 ENCOUNTER — Telehealth: Payer: Self-pay | Admitting: Internal Medicine

## 2024-06-06 NOTE — Telephone Encounter (Signed)
 Patient is calling really upset because she was told her appointment was rescheduled for the month of June to discuss her having  a Tiff procedure. Patient was originally scheduled for January 20, 2024. Patient is very upset her appointment was never rescheduled and would like to speak to Dr. Bridgett Camps or his nurse on getting this appointment scheduled soon. Patient is requesting a call back. Please advise.

## 2024-06-06 NOTE — Telephone Encounter (Signed)
Attempted to reach patient. No answer and mailbox is full. 

## 2024-06-06 NOTE — Telephone Encounter (Signed)
 Patient no showed her appointment on 01/20/24. She can schedule to see Dr Karene Oto for his next available date.

## 2024-06-08 NOTE — Telephone Encounter (Signed)
 Left message for patient to call back

## 2024-06-09 ENCOUNTER — Ambulatory Visit: Payer: Self-pay | Admitting: Sports Medicine

## 2024-06-09 NOTE — Telephone Encounter (Signed)
 Spoke to patient who says that she called in January to reschedule her appointment with Dr Karene Oto and explained she could not come for 1/22 appointment (that we have marked as a no show) but states she was told that he had no availability until June and that schedule was not out yet. States she asked to be rescheduled in June and placed on a wait list. States nobody reached out to her after that. I have scheduled patient to see Dr Karene Oto to discuss TIF on 07/27/24 at 240 pm. Patient verbalizes understanding of this and thanked me for the call.

## 2024-06-24 ENCOUNTER — Other Ambulatory Visit: Payer: Self-pay

## 2024-06-24 ENCOUNTER — Ambulatory Visit (INDEPENDENT_AMBULATORY_CARE_PROVIDER_SITE_OTHER): Admitting: Sports Medicine

## 2024-06-24 ENCOUNTER — Encounter: Payer: Self-pay | Admitting: Sports Medicine

## 2024-06-24 DIAGNOSIS — M7918 Myalgia, other site: Secondary | ICD-10-CM

## 2024-06-24 DIAGNOSIS — M533 Sacrococcygeal disorders, not elsewhere classified: Secondary | ICD-10-CM | POA: Diagnosis not present

## 2024-06-24 DIAGNOSIS — M51369 Other intervertebral disc degeneration, lumbar region without mention of lumbar back pain or lower extremity pain: Secondary | ICD-10-CM

## 2024-06-24 DIAGNOSIS — M5136 Other intervertebral disc degeneration, lumbar region with discogenic back pain only: Secondary | ICD-10-CM

## 2024-06-24 NOTE — Progress Notes (Signed)
 Crystal Mcintyre - 48 y.o. female MRN [id number]  Date of birth: 1976-04-19  Office Visit Note: Visit Date: 06/24/2024 PCP: Charlett Apolinar POUR, MD Referred by: Charlett Apolinar POUR, MD  Subjective: Chief Complaint  Patient presents with   Lower Back - Follow-up   HPI: Crystal Mcintyre is a pleasant 48 y.o. female who presents today for follow-up of chronic low back pain with left > right SI joint pain, also MRI review.  Colleen continues with chronic bilateral low back pain.  She also is having pain most specifically over the left side of the low back near the SI joint that does radiate into the buttocks.  She is not having numbness or tingling or radicular symptoms going down the legs.  She has done physical therapy in the past and does have some of her home exercises for the back and pelvis but has not been doing these here recently.  She would like some guidance regarding her MRI and next steps.  She does travel quite a bit for her work which can exacerbate her pain.  Pertinent ROS were reviewed with the patient and found to be negative unless otherwise specified above in HPI.   Assessment & Plan: Visit Diagnoses:  1. SI (sacroiliac) joint dysfunction   2. Bulge of lumbar disc without myelopathy   3. Degeneration of intervertebral disc of lumbar region with discogenic back pain   4. Pain in left buttock    Plan: Impression is chronic and persistent bilateral low back pain (L > R) which is multifactorial in nature.  She does have evidence of SI joint dysfunction with some sclerosis noted on x-rays and MRI.  MRI does show multiple intervertebral lumbar disc bulges with a moderate-sized broad-based disc bulge at the L4-L5 level that does not have significant neural impingement or contact.  She also has a mild levoscoliosis which puts increased pressure over the SI joint region specifically.  We did review all of her pathology and discussed treatment options for the above.  After shared decision  making, we did proceed with ultrasound-guided left SI joint injection, patient tolerated well.  Advised on postinjection protocol, may use ice/heat and/or Tylenol /Motrin  for any postinjection pain.  I would like her then starting on Monday to begin her low back and pelvic rehab exercises she was shown at physical therapy, performing these once daily.  She will send me a message in 10 days reporting her degree of an improvement status post this injection.  We did discuss if for some reason this does not significantly improve her pain, next step may include referral to Dr. Eldonna for an interlaminar L4-L5 injection.  We will decide on this based on her response to the injection and her message.   Follow-up: Return for Will send mychart update ins 10 days.   Meds & Orders: No orders of the defined types were placed in this encounter.   Orders Placed This Encounter  Procedures   US  Guided Needle Placement - No Linked Charges     Procedures: U/S-guided SI-joint injection, Left   After discussion of risk/benefits/indications, informed verbal consent was obtained. A timeout was then performed. The patient was positioned in a prone position on exam room table with a pillow placed under the pelvis for mild hip flexion. The SI joint area was cleaned and prepped with betadine and alcohol swabs. Sterile ultrasound gel was applied and the ultrasound transducer was placed in an anatomic axial plane over the PSIS, then moved distally over  the SI-joint. Using ultrasound guidance, a 22-gauge, 3.5 needle was inserted from a medial to lateral approach utilizing an in-plane approach and directed into the SI-joint. The SI-joint was then injected with a mixture of 4:2 lidocaine :depomedrol with visualization of the injectate flow into the SI-joint under ultrasound visualization. The patient tolerated the procedure well without immediate complications.       Clinical History: No specialty comments available.  She  reports that she quit smoking about 15 years ago. Her smoking use included cigarettes. She has never used smokeless tobacco. No results for input(s): HGBA1C, LABURIC in the last 8760 hours.  Objective:    Physical Exam  Gen: Well-appearing, in no acute distress; non-toxic CV: Well-perfused. Warm.  Resp: Breathing unlabored on room air; no wheezing. Psych: Fluid speech in conversation; appropriate affect; normal thought process  Ortho Exam - Lumbar/SI-joints: + TTP over left SI joint, just inferior to PSIS. + Fortin's point test on the left, minimal on the right.  There is worsening in pain with extension.  Positive SI joint compression test.  Nonantalgic gait, preserved strength in the lower extremities in the L3-S1 myotome.  Imaging:  *I did personally review the MRI today and reviewed this in the room with the patient with explanation of pathology  MR Lumbar Spine w/o contrast MR LUMBAR SPINE WITHOUT IV CONTRAST  COMPARISON: None available  CLINICAL HISTORY: Low back pain x20 years.  TECHNIQUE: SAG T2, SAG T1, SAG STIR, AX T2, AX T1 without IV contrast.  FINDINGS: There is mild levoscoliosis of the lumbar spine with multilevel disc desiccation most notably at L3-4, L4-5 and L5-S1. Mild facet arthrosis is present. There is no vertebral body height loss, subluxation or marrow replacing process. The sacrum and SI joints are unremarkable so far as visualized. Conus and cauda equina are unremarkable.  T12-L1: There is no focal disc protrusion, foraminal or spinal stenosis.  L1-2: There is no focal disc protrusion, foraminal or spinal stenosis.  L2-3: There is no focal disc protrusion, foraminal or spinal stenosis. Mild facet arthrosis.  L3-4: Mild disc desiccation and minimal broad-based bulge slightly effacing the ventral thecal sac. Mild facet arthrosis is present.  L4-5: Broad-based bulge effacing the ventral thecal sac resulting in crowding of the descending nerve  roots in the lateral recess. There is caudal foraminal narrowing bilaterally. No definitive impingement of the exiting L4 nerves.  L5-S1: Mild disc desiccation and moderate facet arthrosis. There is moderate caudal foraminal narrowing on the left. Correlation for mild left L5 radicular symptoms.  Large uterine fibroids are partially imaged. Otherwise, the visualized retroperitoneal structures are unremarkable.  IMPRESSION: Mild degenerative spondylosis and slight levoscoliosis.  Mild disc desiccation with slight broad-based bulges at L3-4, L4-5 and L5-S1. There is no significant spinal stenosis. Mild caudal foraminal narrowing at L4-5 and L5-S1. Correlation for mild radicular symptoms.  Large uterine fibroids are partially imaged.  Electronically signed by: Norleen Satchel MD 06/06/2024 12:19 PM EDT RP Workstation: [id number]  Past Medical/Family/Surgical/Social History: Medications & Allergies reviewed per EMR, new medications updated. Patient Active Problem List   Diagnosis Date Noted   Uterine leiomyoma 10/28/2021   Fibroids 10/28/2021   Hyperprolactinemia (HCC) 09/14/2015   Left low back pain 01/04/2014   Hyperthyroidism 09/17/2013   GERD (gastroesophageal reflux disease) 08/26/2013   Female fertility problem 01/28/2012   Preventative health care 02/12/2011   Encounter for gynecological examination 02/12/2011   Insomnia 02/12/2011   OTHER ANXIETY STATES 01/30/2010   GOITER, MULTINODULAR 04/18/2009   TRANSAMINASES, SERUM, ELEVATED 01/10/2009  IRREGULAR MENSTRUATION 06/17/2007   Headache 06/17/2007   Past Medical History:  Diagnosis Date   Gastric ulcer    GERD (gastroesophageal reflux disease)    GOITER, MULTINODULAR 04/18/2009   Headache(784.0) 06/17/2007   IRREGULAR MENSTRUATION 06/17/2007   Migraines    Other anxiety states 01/30/2010   Scoliosis    THYROID  STIMULATING HORMONE, ABNORMAL 01/10/2009   TOBACCO USE 01/10/2008   TRANSAMINASES, SERUM, ELEVATED  01/10/2009   Family History  Problem Relation Age of Onset   Colon polyps Mother    Pneumonia Father    Alcohol abuse Father    Breast cancer Paternal Aunt    Breast cancer Paternal Aunt    Diabetes Maternal Grandfather    Prostate cancer Paternal Grandfather    Rectal cancer Neg Hx    Stomach cancer Neg Hx    Colon cancer Neg Hx    Esophageal cancer Neg Hx    Past Surgical History:  Procedure Laterality Date   CARPAL TUNNEL RELEASE Right 09/08/2022   Procedure: RIGHT CARPAL TUNNEL RELEASE;  Surgeon: Murrell Drivers, MD;  Location: [facility];  Service: Orthopedics;  Laterality: Right;  30 MIN   IR ANGIOGRAM PELVIS SELECTIVE OR SUPRASELECTIVE  10/28/2021   IR ANGIOGRAM PELVIS SELECTIVE OR SUPRASELECTIVE  10/28/2021   IR ANGIOGRAM SELECTIVE EACH ADDITIONAL VESSEL  10/28/2021   IR ANGIOGRAM SELECTIVE EACH ADDITIONAL VESSEL  10/28/2021   IR EMBO TUMOR ORGAN ISCHEMIA INFARCT INC GUIDE ROADMAPPING  10/28/2021   IR RADIOLOGIST EVAL & MGMT  07/24/2021   IR RADIOLOGIST EVAL & MGMT  11/28/2021   IR RADIOLOGIST EVAL & MGMT  05/22/2022   IR US  GUIDE VASC ACCESS LEFT  10/28/2021   IR US  GUIDE VASC ACCESS RIGHT  10/28/2021   MASS EXCISION Right 07/04/2021   Procedure: RIGHT THUMB EXCISION MUCOID CYST AND DEBRIDEMENT INTERPHALANGEAL JOINT ARTHRITIS;  Surgeon: Murrell Drivers, MD;  Location: [facility];  Service: Orthopedics;  Laterality: Right;   TONSILLECTOMY  as child   TYMPANOPLASTY  4 yrs ago   TYMPANOSTOMY TUBE PLACEMENT  as child   UPPER GASTROINTESTINAL ENDOSCOPY     Social History   Occupational History   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   I spent 33 minutes in the care of the patient today including face-to-face time, preparation to see the patient, as well as independent review and interpretation of lumbar MRI, explanation of disc and intravertebral pathology with the patient, discussion on diagnostic and therapeutic injection for the SI joint, as well as evaluation and possible injection intervention for her lumbar disc pathology with my partner Dr. Eldonna, review and discussion on continuing physical therapy exercises in a home-based setting following today's procedure for the above diagnoses.   Lonell Sprang, DO Primary Care Sports Medicine Physician  [facility] - [facility]oped[facility]  This note was dictated using Dragon naturally speaking software and may contain errors in syntax,  spelling, or content which have not been identified prior to signing this note.

## 2024-07-27 ENCOUNTER — Encounter: Payer: Self-pay | Admitting: Gastroenterology

## 2024-07-27 ENCOUNTER — Ambulatory Visit (INDEPENDENT_AMBULATORY_CARE_PROVIDER_SITE_OTHER): Admitting: Gastroenterology

## 2024-07-27 ENCOUNTER — Other Ambulatory Visit (INDEPENDENT_AMBULATORY_CARE_PROVIDER_SITE_OTHER)

## 2024-07-27 VITALS — BP 140/80 | Ht 64.0 in | Wt 177.0 lb

## 2024-07-27 DIAGNOSIS — K449 Diaphragmatic hernia without obstruction or gangrene: Secondary | ICD-10-CM

## 2024-07-27 DIAGNOSIS — D509 Iron deficiency anemia, unspecified: Secondary | ICD-10-CM | POA: Diagnosis not present

## 2024-07-27 DIAGNOSIS — R0789 Other chest pain: Secondary | ICD-10-CM | POA: Diagnosis not present

## 2024-07-27 DIAGNOSIS — K219 Gastro-esophageal reflux disease without esophagitis: Secondary | ICD-10-CM

## 2024-07-27 DIAGNOSIS — R12 Heartburn: Secondary | ICD-10-CM

## 2024-07-27 LAB — CBC
HCT: 36.2 % (ref 36.0–46.0)
Hemoglobin: 11.4 g/dL — ABNORMAL LOW (ref 12.0–15.0)
MCHC: 31.6 g/dL (ref 30.0–36.0)
MCV: 81 fl (ref 78.0–100.0)
Platelets: 292 K/uL (ref 150.0–400.0)
RBC: 4.46 Mil/uL (ref 3.87–5.11)
RDW: 16.4 % — ABNORMAL HIGH (ref 11.5–15.5)
WBC: 3.3 K/uL — ABNORMAL LOW (ref 4.0–10.5)

## 2024-07-27 LAB — IBC + FERRITIN
Ferritin: 5.9 ng/mL — ABNORMAL LOW (ref 10.0–291.0)
Iron: 32 ug/dL — ABNORMAL LOW (ref 42–145)
Saturation Ratios: 7 % — ABNORMAL LOW (ref 20.0–50.0)
TIBC: 455 ug/dL — ABNORMAL HIGH (ref 250.0–450.0)
Transferrin: 325 mg/dL (ref 212.0–360.0)

## 2024-07-27 LAB — COMPREHENSIVE METABOLIC PANEL WITH GFR
ALT: 10 U/L (ref 0–35)
AST: 16 U/L (ref 0–37)
Albumin: 4.7 g/dL (ref 3.5–5.2)
Alkaline Phosphatase: 91 U/L (ref 39–117)
BUN: 10 mg/dL (ref 6–23)
CO2: 31 meq/L (ref 19–32)
Calcium: 9.7 mg/dL (ref 8.4–10.5)
Chloride: 102 meq/L (ref 96–112)
Creatinine, Ser: 0.7 mg/dL (ref 0.40–1.20)
GFR: 101.45 mL/min (ref >60.00)
Glucose, Bld: 90 mg/dL (ref 70–99)
Potassium: 4.1 meq/L (ref 3.5–5.1)
Sodium: 138 meq/L (ref 135–145)
Total Bilirubin: 0.3 mg/dL (ref 0.2–1.2)
Total Protein: 7.6 g/dL (ref 6.0–8.3)

## 2024-07-27 LAB — B12 AND FOLATE PANEL
Folate: 17.9 ng/mL (ref >5.9)
Vitamin B-12: 616 pg/mL (ref 211–911)

## 2024-07-27 LAB — VITAMIN D 25 HYDROXY (VIT D DEFICIENCY, FRACTURES): VITD: 24.49 ng/mL — ABNORMAL LOW (ref 30.00–100.00)

## 2024-07-27 NOTE — Patient Instructions (Addendum)
 _______________________________________________________  If your blood pressure at your visit was 140/90 or greater, please contact your primary care physician to follow up on this.  If you are age 49 or younger, your body mass index should be between 19-25. Your Body mass index is 30.38 kg/m. If this is out of the aformentioned range listed, please consider follow up with your Primary Care Provider.  ________________________________________________________  The [facility] providers would like to encourage you to use MYCHART to communicate with providers for non-urgent requests or questions.  Due to long hold times on the telephone, sending your provider a message by [facility] may be a faster and more efficient way to get a response.  Please allow 48 business hours for a response.  Please remember that this is for non-urgent requests.  _______________________________________________________  Your provider has requested that you go to the basement level for lab work before leaving today. Press B on the elevator. The lab is located at the first door on the left as you exit the elevator.  You have been scheduled for an endoscopy. Please follow written instructions given to you at your visit today.  If you use inhalers (even only as needed), please bring them with you on the day of your procedure.  If you take any of the following medications, they will need to be adjusted prior to your procedure:   DO NOT TAKE 7 DAYS PRIOR TO TEST- Trulicity (dulaglutide) Ozempic, Wegovy (semaglutide) Mounjaro (tirzepatide) Bydureon Bcise (exanatide extended release)  DO NOT TAKE 1 DAY PRIOR TO YOUR TEST Rybelsus (semaglutide) Adlyxin (lixisenatide) Victoza (liraglutide) Byetta (exanatide) ___________________________________________________________________________  Rosine have been scheduled for an esophageal manometry test at [facility] on 10-26-24 at 12:30pm. Please arrive 30 minutes prior to  your procedure for registration. You will need to go to outpatient registration (1st floor of the hospital) first. Make certain to bring your insurance cards as well as a complete list of medications.  Please remember the following:  1) Do not take any muscle relaxants, xanax (alprazolam) or ativan for 1 day prior to your test as well as the day of the test.  2) Nothing to eat or drink after 12:00 midnight on the night before your test.  3) Hold all diabetic medications/insulin the morning of the test. You may eat and take your medications after the test.  It will take at least 2 weeks to receive the results of this test from your physician.  ------------------------------------------ ABOUT ESOPHAGEAL MANOMETRY Esophageal manometry (muh-NOM-uh-tree) is a test that gauges how well your esophagus works. Your esophagus is the long, muscular tube that connects your throat to your stomach. Esophageal manometry measures the rhythmic muscle contractions (peristalsis) that occur in your esophagus when you swallow. Esophageal manometry also measures the coordination and force exerted by the muscles of your esophagus.   During esophageal manometry, a thin, flexible tube (catheter) that contains sensors is passed through your nose, down your esophagus and into your stomach. Esophageal manometry can be helpful in diagnosing some mostly uncommon disorders that affect your esophagus.   Why it's done Esophageal manometry is used to evaluate the movement (motility) of food through the esophagus and into the stomach. The test measures how well the circular bands of muscle (sphincters) at the top and bottom of your esophagus open and close, as well as the pressure, strength and pattern of the wave of esophageal muscle contractions that moves food along.   What you can expect Esophageal manometry is an outpatient procedure done  without sedation. Most people tolerate it well. You may be asked to change into a  hospital gown before the test starts.   During esophageal manometry  While you are sitting up, a member of your health care team sprays your throat with a numbing medication or puts numbing gel in your nose or both.  A catheter is guided through your nose into your esophagus. The catheter may be sheathed in a water-filled sleeve. It doesn't interfere with your breathing. However, your eyes may water, and you may gag. You may have a slight nosebleed from irritation.  After the catheter is in place, you may be asked to lie on your back on an exam table, or you may be asked to remain seated.  You then swallow small sips of water. As you do, a computer connected to the catheter records the pressure, strength and pattern of your esophageal muscle contractions.  During the test, you'll be asked to breathe slowly and smoothly, remain as still as possible, and swallow only when you're asked to do so.  A member of your health care team may move the catheter down into your stomach while the catheter continues its measurements.  The catheter then is slowly withdrawn.  The test usually lasts 20 to 30 minutes.   After esophageal manometry  When your esophageal manometry is complete, you may return to your normal activities  This test typically takes 30-45 minutes to complete. ________________________________________________________________________________  Due to recent changes in healthcare laws, you may see the results of your imaging and laboratory studies on MyChart before your provider has had a chance to review them.  We understand that in some cases there may be results that are confusing or concerning to you. Not all laboratory results come back in the same time frame and the provider may be waiting for multiple results in order to interpret others.  Please give us  48 hours in order for your provider to thoroughly review all the results before contacting the office for clarification of your results.    [facility] is using a team-based approach to care.  Your team is made up of your doctor and two to three APPS. Our APPS (Nurse Practitioners and Physician Assistants) work with your physician to ensure care continuity for you. They are fully qualified to address your health concerns and develop a treatment plan. They communicate directly with your gastroenterologist to care for you. Seeing the Advanced Practice Practitioners on your physician's team can help you by facilitating care more promptly, often allowing for earlier appointments, access to diagnostic testing, procedures, and other specialty referrals.   It was a pleasure to see you today!  Vito Cirigliano, D.O.

## 2024-07-27 NOTE — Progress Notes (Signed)
 Chief Complaint:    GERD, discuss antireflux surgical options   HPI:     Patient is a 48 y.o. female with a history of migraines, anxiety, postmenopausal insomnia, Endometriosis, uterine fibroids, NSAID induced gastric ulcer, and GERD, referred to me by Dr. Albertus for evaluation of possible antireflux intervention with Transoral Incisionless Fundoplication (TIF) with a goal to stop or significantly reduce acid suppression therapy.  Longstanding history of GERD for 20+ years.  Avoids caffeine and acidic foods due to fear of atypical chest pain from her reflux.  NCCP and spasm is her most bothersome symptom. Avoids eating close to bedtime. Has breakthrough reflux if she misses a day of PPI.  No dysphagia.   GERD history: -Index symptoms: Heartburn, chest pain. No regurgitation, no dysphagia -Exacerbating features: Spicy, supine, tomato-based sauces -Medications trialed: Omeprazole , Gaviscon, Zantac, Pepcid, Tums -Current medications: Pantoprazole  40 mg twice daily -Complications: Hiatal hernia, histologic esophagitis  GERD evaluation: -Last EGD: 05/02/2022 -Barium esophagram: None.   -Esophageal Manometry: None.  Ordered today -pH/Impedance: None.  Ordered today -Bravo: None  Endoscopic History: - 08/19/2012: EGD: Gastric ulcer x 2, severe erosive antral gastritis.  Small hiatal hernia with otherwise normal esophagus and duodenum - 11/19/2012: EGD: Normal esophagus, mild antral gastropathy with resolution of previously noted ulcers.  Normal duodenum. - 05/02/2022: EGD: Normal esophagus (biopsies with reflux changes).  Nodular/polypoid mucosa in the prepyloric region of the stomach (path: Hyperplastic gastric polyp).  Otherwise normal remainder of the stomach and normal duodenum.  No images available for review from that report.     Review of systems:     No chest pain, no SOB, no fevers, no urinary sx   Past Medical History:  Diagnosis Date   Gastric ulcer    GERD  (gastroesophageal reflux disease)    GOITER, MULTINODULAR 04/18/2009   Headache(784.0) 06/17/2007   IRREGULAR MENSTRUATION 06/17/2007   Migraines    Other anxiety states 01/30/2010   Scoliosis    THYROID  STIMULATING HORMONE, ABNORMAL 01/10/2009   TOBACCO USE 01/10/2008   TRANSAMINASES, SERUM, ELEVATED 01/10/2009    Patient's surgical history, family medical history, social history, medications and allergies were all reviewed in Epic    Current Outpatient Medications  Medication Sig Dispense Refill   Estradiol-Progesterone (BIJUVA) 1-100 MG CAPS      Multiple Vitamin (MULTI VITAMIN DAILY PO) Take 2 tablets by mouth daily.     pantoprazole  (PROTONIX ) 40 MG tablet Take 1 tablet (40 mg total) by mouth 2 (two) times daily. 180 tablet 3   No current facility-administered medications for this visit.    Physical Exam:     BP (!) 140/80   Ht 5' 4 (1.626 m)   Wt 177 lb (80.3 kg)   BMI 30.38 kg/m   GENERAL:  Pleasant female in NAD PSYCH:  Cooperative, normal affect CARDIAC:  RRR, no murmur heard, no peripheral edema PULM: Normal respiratory effort, lungs CTA bilaterally, no wheezing ABDOMEN:  Nondistended, soft, nontender. No obvious masses, no hepatomegaly,  normal bowel sounds SKIN:  turgor, no lesions seen Musculoskeletal:  Normal muscle tone, normal strength NEURO: Alert and oriented x 3, no focal neurologic deficits   IMPRESSION and PLAN:    1) GERD 2) Noncardiac chest pain 3) Hiatal hernia 4) Heartburn 48 year old female with longstanding history of reflux symptoms.  Initially mostly atypical symptoms with atypical, noncardiac chest pain, but has also developed heartburn over the years.  Symptoms generally controlled with pantoprazole  40 mg twice daily, but breakthrough with any missed dose  of PPI.  We had a in-depth conversation today regarding the pathophysiology of reflux to include treatment options of 1) continued acid suppression medication, 2) antireflux surgery.   Discussed antireflux surgical options, and she is interested in TIF as a means to better control her reflux and stop or significantly reduce the need for acid suppression therapy.  Plan for the following:  - Repeat EGD now to evaluate grade/severity of previously noted hiatal hernia and degree of LES laxity, along with presence of erosive esophagitis and for pre-operative evaluation - Esophageal Manometry and pH/Mii (off PPI x5 days) - Check BMP along with micronutrient panel given chronic PPI use  5) Microcytic anemia History of microcytic anemia, but last set of labs was 06/2022 with H/H 9.7/32 and MCV/RDW 79.9/15.7. - Repeat CBC check today - Micronutrient panel as above     6) Colon cancer screening - Normal colonoscopy in 04/2022.  Repeat in 10 years (2033)  The indications, risks, and benefits of EGD were explained to the patient in detail. Risks include but are not limited to bleeding, perforation, adverse reaction to medications, and cardiopulmonary compromise. Sequelae include but are not limited to the possibility of surgery, hospitalization, and mortality. The patient verbalized understanding and wished to proceed. All questions answered, referred to scheduler. Further recommendations pending results of the exam.   I spent 45 minutes of time, including in depth chart review, independent review of results as outlined above, communicating results with the patient directly, face-to-face time with the patient, coordinating care, ordering studies and medications as appropriate, and documentation.       Sandor LULLA Flatter ,DO, FACG 07/27/2024, 2:57 PM

## 2024-07-27 NOTE — Progress Notes (Unsigned)
 SABRA

## 2024-07-28 ENCOUNTER — Ambulatory Visit: Payer: Self-pay | Admitting: Gastroenterology

## 2024-07-28 MED ORDER — FERROUS SULFATE 325 (65 FE) MG PO TBEC: 325.0000 mg | DELAYED_RELEASE_TABLET | Freq: Two times a day (BID) | ORAL | Status: DC

## 2024-07-28 MED ORDER — VITAMIN D (ERGOCALCIFEROL) 1.25 MG (50000 UNIT) PO CAPS: 50000.0000 [IU] | ORAL_CAPSULE | ORAL | 0 refills | Status: DC

## 2024-08-04 ENCOUNTER — Encounter: Payer: Self-pay | Admitting: Sports Medicine

## 2024-08-23 ENCOUNTER — Encounter: Payer: Self-pay | Admitting: Gastroenterology

## 2024-08-30 ENCOUNTER — Telehealth: Payer: Self-pay | Admitting: Gastroenterology

## 2024-08-30 NOTE — Telephone Encounter (Signed)
 Good afternoon Dr. San,   I received a call from this patient requesting to reschedule her EGD due to her transportation informing her that they tested positive for Covid. Patient was schedule for September the 4 th but was rescheduled for the 22 of September. Please advise.

## 2024-09-01 ENCOUNTER — Encounter: Admitting: Gastroenterology

## 2024-09-19 ENCOUNTER — Ambulatory Visit (AMBULATORY_SURGERY_CENTER): Admitting: Gastroenterology

## 2024-09-19 ENCOUNTER — Encounter: Payer: Self-pay | Admitting: Gastroenterology

## 2024-09-19 VITALS — BP 121/87 | HR 58 | Temp 97.2°F | Resp 13 | Ht 64.0 in | Wt 177.0 lb

## 2024-09-19 DIAGNOSIS — R0789 Other chest pain: Secondary | ICD-10-CM

## 2024-09-19 DIAGNOSIS — Z01818 Encounter for other preprocedural examination: Secondary | ICD-10-CM

## 2024-09-19 DIAGNOSIS — K3189 Other diseases of stomach and duodenum: Secondary | ICD-10-CM | POA: Diagnosis not present

## 2024-09-19 DIAGNOSIS — K2950 Unspecified chronic gastritis without bleeding: Secondary | ICD-10-CM | POA: Diagnosis present

## 2024-09-19 DIAGNOSIS — K219 Gastro-esophageal reflux disease without esophagitis: Secondary | ICD-10-CM

## 2024-09-19 DIAGNOSIS — K2970 Gastritis, unspecified, without bleeding: Secondary | ICD-10-CM

## 2024-09-19 MED ORDER — SODIUM CHLORIDE 0.9 % IV SOLN: 500.0000 mL | INTRAVENOUS | Status: AC

## 2024-09-19 NOTE — Patient Instructions (Signed)
 Resume previous diet. Continue present medications.  Awaiting pathology results. Proceed with Esophageal Manometry and pH/Impedence testing (off PPI) as scheduled.  YOU HAD AN ENDOSCOPIC PROCEDURE TODAY AT THE [facility]:   Refer to the procedure report that was given to you for any specific questions about what was found during the examination.  If the procedure report does not answer your questions, please call your gastroenterologist to clarify.  If you requested that your care partner not be given the details of your procedure findings, then the procedure report has been included in a sealed envelope for you to review at your convenience later.  YOU SHOULD EXPECT: Some feelings of bloating in the abdomen. Passage of more gas than usual.  Walking can help get rid of the air that was put into your GI tract during the procedure and reduce the bloating. If you had a lower endoscopy (such as a colonoscopy or flexible sigmoidoscopy) you may notice spotting of blood in your stool or on the toilet paper. If you underwent a bowel prep for your procedure, you may not have a normal bowel movement for a few days.  Please Note:  You might notice some irritation and congestion in your nose or some drainage.  This is from the oxygen used during your procedure.  There is no need for concern and it should clear up in a day or so.  SYMPTOMS TO REPORT IMMEDIATELY:  Following upper endoscopy (EGD)  Vomiting of blood or coffee ground material  New chest pain or pain under the shoulder blades  Painful or persistently difficult swallowing  New shortness of breath  Fever of 100F or higher  Black, tarry-looking stools  For urgent or emergent issues, a gastroenterologist can be reached at any hour by calling [phone]. Do not use MyChart messaging for urgent concerns.    DIET:  We do recommend a small meal at first, but then you may proceed to your regular diet.  Drink plenty of fluids but you  should avoid alcoholic beverages for 24 hours.  ACTIVITY:  You should plan to take it easy for the rest of today and you should NOT DRIVE or use heavy machinery until tomorrow (because of the sedation medicines used during the test).    FOLLOW UP: Our staff will call the number listed on your records the next business day following your procedure.  We will call around 7:15- 8:00 am to check on you and address any questions or concerns that you may have regarding the information given to you following your procedure. If we do not reach you, we will leave a message.     If any biopsies were taken you will be contacted by phone or by letter within the next 1-3 weeks.  Please call us  at [phone] if you have not heard about the biopsies in 3 weeks.    SIGNATURES/CONFIDENTIALITY: You and/or your care partner have signed paperwork which will be entered into your electronic medical record.  These signatures attest to the fact that that the information above on your After Visit Summary has been reviewed and is understood.  Full responsibility of the confidentiality of this discharge information lies with you and/or your care-partner.

## 2024-09-19 NOTE — Progress Notes (Signed)
 GASTROENTEROLOGY PROCEDURE H&P NOTE   Primary Care Physician: Charlett Apolinar POUR, MD    Reason for Procedure:  GERD, preoperative evaluation, noncardiac chest pain  Plan:    EGD  Patient is appropriate for endoscopic procedure(s) in the ambulatory ([facility]) setting.  The nature of the procedure, as well as the risks, benefits, and alternatives were carefully and thoroughly reviewed with the patient. Ample time for discussion and questions allowed. The patient understood, was satisfied, and agreed to proceed.     HPI: Crystal Mcintyre is a 48 y.o. female who presents for EGD for evaluation of GERD with breakthrough noncardiac chest pain and heartburn, along with preoperative evaluation for possible antireflux surgery.  Currently taking pantoprazole  40 mg twice daily and has breakthrough symptoms with any missed dose.  Additionally, scheduled for esophageal manometry and pH/impedance study (off PPI) on 10/26/2024.  Otherwise, no significant changes in clinical history since last OV with me on 07/27/2024.  Past Medical History:  Diagnosis Date   Gastric ulcer    GERD (gastroesophageal reflux disease)    GOITER, MULTINODULAR 04/18/2009   Headache(784.0) 06/17/2007   IRREGULAR MENSTRUATION 06/17/2007   Migraines    Other anxiety states 01/30/2010   Scoliosis    THYROID  STIMULATING HORMONE, ABNORMAL 01/10/2009   TOBACCO USE 01/10/2008   TRANSAMINASES, SERUM, ELEVATED 01/10/2009    Past Surgical History:  Procedure Laterality Date   CARPAL TUNNEL RELEASE Right 09/08/2022   Procedure: RIGHT CARPAL TUNNEL RELEASE;  Surgeon: Murrell Drivers, MD;  Location: [facility];  Service: Orthopedics;  Laterality: Right;  30 MIN   IR ANGIOGRAM PELVIS SELECTIVE OR SUPRASELECTIVE  10/28/2021   IR ANGIOGRAM PELVIS SELECTIVE OR SUPRASELECTIVE  10/28/2021   IR ANGIOGRAM SELECTIVE EACH ADDITIONAL VESSEL  10/28/2021   IR ANGIOGRAM SELECTIVE EACH ADDITIONAL VESSEL  10/28/2021   IR EMBO TUMOR  ORGAN ISCHEMIA INFARCT INC GUIDE ROADMAPPING  10/28/2021   IR RADIOLOGIST EVAL & MGMT  07/24/2021   IR RADIOLOGIST EVAL & MGMT  11/28/2021   IR RADIOLOGIST EVAL & MGMT  05/22/2022   IR US  GUIDE VASC ACCESS LEFT  10/28/2021   IR US  GUIDE VASC ACCESS RIGHT  10/28/2021   MASS EXCISION Right 07/04/2021   Procedure: RIGHT THUMB EXCISION MUCOID CYST AND DEBRIDEMENT INTERPHALANGEAL JOINT ARTHRITIS;  Surgeon: Murrell Drivers, MD;  Location: [facility];  Service: Orthopedics;  Laterality: Right;   TONSILLECTOMY  as child   TYMPANOPLASTY  4 yrs ago   TYMPANOSTOMY TUBE PLACEMENT  as child   UPPER GASTROINTESTINAL ENDOSCOPY      Prior to Admission medications   Medication Sig Start Date End Date Taking? Authorizing Provider  Estradiol-Progesterone (BIJUVA) 1-100 MG CAPS  05/11/24  Yes [provider]  Multiple Vitamin (MULTI VITAMIN DAILY PO) Take 2 tablets by mouth daily.   Yes [provider]  pantoprazole  (PROTONIX ) 40 MG tablet Take 1 tablet (40 mg total) by mouth 2 (two) times daily. 09/02/23  Yes Pyrtle, Gordy HERO, MD  Vitamin D , Ergocalciferol , (DRISDOL ) 1.25 MG (50000 UNIT) CAPS capsule Take 1 capsule (50,000 Units total) by mouth every 7 (seven) days. For 8 weeks 07/28/24  Yes Savvy Peeters V, DO  ferrous sulfate  325 (65 FE) MG EC tablet Take 1 tablet (325 mg total) by mouth 2 (two) times daily. 07/28/24   Jolyne Laye V, DO    Current Outpatient Medications  Medication Sig Dispense Refill   Estradiol-Progesterone (BIJUVA) 1-100 MG CAPS      Multiple Vitamin (MULTI VITAMIN  DAILY PO) Take 2 tablets by mouth daily.     pantoprazole  (PROTONIX ) 40 MG tablet Take 1 tablet (40 mg total) by mouth 2 (two) times daily. 180 tablet 3   Vitamin D , Ergocalciferol , (DRISDOL ) 1.25 MG (50000 UNIT) CAPS capsule Take 1 capsule (50,000 Units total) by mouth every 7 (seven) days. For 8 weeks 8 capsule 0   ferrous sulfate  325 (65 FE) MG EC tablet Take 1 tablet (325 mg total) by mouth 2  (two) times daily.     Current Facility-Administered Medications  Medication Dose Route Frequency Provider Last Rate Last Admin   0.9 %  sodium chloride  infusion  500 mL Intravenous Continuous Celine Dishman V, DO        Allergies as of 09/19/2024 - Review Complete 09/19/2024  Allergen Reaction Noted   Excedrin extra strength [asa-apap-caff buffered] Other (See Comments) 10/28/2021    Family History  Problem Relation Age of Onset   Colon polyps Mother    Pneumonia Father    Alcohol abuse Father    Breast cancer Paternal Aunt    Breast cancer Paternal Aunt    Diabetes Maternal Grandfather    Prostate cancer Paternal Grandfather    Rectal cancer Neg Hx    Stomach cancer Neg Hx    Colon cancer Neg Hx    Esophageal cancer Neg Hx     Social History   Socioeconomic History   Marital status: Legally Separated    Spouse name: Not on file   Number of children: Not on file   Years of education: Not on file   Highest education level: Not on file  Occupational History   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      Social Drivers of Corporate investment banker Strain: Not on file  Food Insecurity: Not on file  Transportation Needs: Not on file  Physical Activity: Not on file  Stress: Not on file  Social Connections: Not  on file  Intimate Partner Violence: Not on file    Physical Exam: Vital signs in last 24 hours: @BP  113/79   Pulse 61   Temp (!) 97.2 F (36.2 C) (Temporal)   Resp 14   Ht 5' 4 (1.626 m)   Wt 177 lb (80.3 kg)   SpO2 100%   BMI 30.38 kg/m  GEN: NAD EYE: Sclerae anicteric ENT: MMM CV: Non-tachycardic Pulm: CTA b/l GI: Soft, NT/ND NEURO:  Alert & Oriented x 3   Sandor Flatter, DO [facility]   09/19/2024 3:40 PM

## 2024-09-19 NOTE — Progress Notes (Signed)
 To pacu, VSS. Report to [email or web address].tb

## 2024-09-19 NOTE — Op Note (Signed)
 [facility] Patient Name: Crystal Mcintyre Procedure Date: 09/19/2024 3:26 PM MRN: [id number] Endoscopist: Sandor Flatter , MD, [id number] Age: 48 Referring MD:  Date of Birth: 1976-06-21 Gender: Female Account #: [id number] Procedure:                Upper GI endoscopy Indications:              Heartburn, Esophageal reflux, Preoperative                            assessment Medicines:                Monitored Anesthesia Care Procedure:                Pre-Anesthesia Assessment:                           - Prior to the procedure, a History and Physical                            was performed, and patient medications and                            allergies were reviewed. The patient's tolerance of                            previous anesthesia was also reviewed. The risks                            and benefits of the procedure and the sedation                            options and risks were discussed with the patient.                            All questions were answered, and informed consent                            was obtained. Prior Anticoagulants: The patient has                            taken no anticoagulant or antiplatelet agents. ASA                            Grade Assessment: II - A patient with mild systemic                            disease. After reviewing the risks and benefits,                            the patient was deemed in satisfactory condition to                            undergo the procedure.  After obtaining informed consent, the endoscope was                            passed under direct vision. Throughout the                            procedure, the patient's blood pressure, pulse, and                            oxygen saturations were monitored continuously. The                            GIF [id number] #[id number] was introduced through the                            mouth, and advanced to the second part of  duodenum.                            The upper GI endoscopy was accomplished without                            difficulty. The patient tolerated the procedure                            well. Scope In: Scope Out: Findings:                 The examined esophagus was normal.                           The Z-line was regular and was found 36 cm from the                            incisors.                           The gastroesophageal flap valve was visualized                            endoscopically and classified as Hill Grade II                            (fold present, opens with respiration).                           Localized mildly erythematous mucosa without                            bleeding was found in the prepyloric region of the                            stomach. Biopsies were taken with a cold forceps                            for histology. Estimated blood loss was minimal.  The gastric fundus, gastric body and incisura were                            normal.                           The examined duodenum was normal. Complications:            No immediate complications. Estimated Blood Loss:     Estimated blood loss was minimal. Impression:               - Normal esophagus.                           - Z-line regular, 36 cm from the incisors.                           - Gastroesophageal flap valve classified as Hill                            Grade II (fold present, opens with respiration).                           - Erythematous mucosa in the prepyloric region of                            the stomach. Biopsied.                           - Normal gastric fundus, gastric body and incisura.                           - Normal examined duodenum. Recommendation:           - Patient has a contact number available for                            emergencies. The signs and symptoms of potential                            delayed complications were  discussed with the                            patient. Return to normal activities tomorrow.                            Written discharge instructions were provided to the                            patient.                           - Resume previous diet.                           - Continue present medications.                           -  Await pathology results.                           - Proceed with Esophageal Manometry and                            pH/Impedance testing (off PPI) as scheduled. Will                            follow-up on results. Sandor Flatter, MD 09/19/2024 3:59:42 PM

## 2024-09-19 NOTE — Progress Notes (Signed)
 Called to room to assist during endoscopic procedure.  Patient ID and intended procedure confirmed with present staff. Received instructions for my participation in the procedure from the performing physician.

## 2024-09-20 ENCOUNTER — Telehealth: Payer: Self-pay

## 2024-09-20 NOTE — Telephone Encounter (Signed)
 No answer after follow up call. Voice message left.

## 2024-09-22 LAB — SURGICAL PATHOLOGY

## 2024-09-23 ENCOUNTER — Ambulatory Visit: Payer: Self-pay | Admitting: Gastroenterology

## 2024-09-29 ENCOUNTER — Other Ambulatory Visit: Payer: Self-pay

## 2024-09-29 DIAGNOSIS — E559 Vitamin D deficiency, unspecified: Secondary | ICD-10-CM

## 2024-09-29 DIAGNOSIS — D509 Iron deficiency anemia, unspecified: Secondary | ICD-10-CM

## 2024-10-17 ENCOUNTER — Telehealth: Payer: Self-pay | Admitting: Sports Medicine

## 2024-10-17 NOTE — Telephone Encounter (Signed)
 Patient called and said that she is ready for the second injection in her back and was told that another Doctor would do it. So she is ready. CB#[phone]

## 2024-10-17 NOTE — Telephone Encounter (Signed)
 Called patient back. Patient had SI joint injection in June and messaged in August with update of 80% relief. She states that this improvement lasted through mid-September. Her pain now is the same as it was prior to the SI joint injection. Should she see you for repeat injection, or referral to Dr. Eldonna as mentioned at previous visit?   Patient prefers call vs MyChart message.

## 2024-10-18 NOTE — Telephone Encounter (Signed)
 Called patient and left voicemail stating that Dr. Burnetta recommends repeating US -guided SI joint injection. She may call the office to schedule for next available appointment.

## 2024-10-19 ENCOUNTER — Telehealth: Payer: Self-pay | Admitting: Gastroenterology

## 2024-10-19 NOTE — Telephone Encounter (Signed)
 Inbound call from patient stating she would like to speak to nurse in regards to some questions she has on upcoming procedure at [facility] on 10/26/24 Requesting a call back  Please advise  Thank you

## 2024-10-19 NOTE — Telephone Encounter (Signed)
 Returned call to patient. Patient wanted to review instructions for upcoming esophageal manometry & pH study on 10/29. Patient states that the instructions were not explained to her and she was told that she needed a driver. I informed patient that she will not need a driver, she is able to drive herself as she will not be sedated for the procedure. I explained what would happen during the procedure. I informed patient that I will send her a MyChart message with detailed information. Patient will review & contact us  if she has any additional questions. Patient verbalized understanding.

## 2024-10-24 ENCOUNTER — Ambulatory Visit: Payer: Self-pay | Admitting: Gastroenterology

## 2024-10-24 ENCOUNTER — Other Ambulatory Visit: Payer: Self-pay

## 2024-10-24 ENCOUNTER — Other Ambulatory Visit (INDEPENDENT_AMBULATORY_CARE_PROVIDER_SITE_OTHER)

## 2024-10-24 ENCOUNTER — Ambulatory Visit (INDEPENDENT_AMBULATORY_CARE_PROVIDER_SITE_OTHER): Admitting: Sports Medicine

## 2024-10-24 ENCOUNTER — Encounter: Payer: Self-pay | Admitting: Sports Medicine

## 2024-10-24 DIAGNOSIS — M545 Low back pain, unspecified: Secondary | ICD-10-CM | POA: Diagnosis not present

## 2024-10-24 DIAGNOSIS — G8929 Other chronic pain: Secondary | ICD-10-CM

## 2024-10-24 DIAGNOSIS — D509 Iron deficiency anemia, unspecified: Secondary | ICD-10-CM

## 2024-10-24 DIAGNOSIS — E559 Vitamin D deficiency, unspecified: Secondary | ICD-10-CM | POA: Diagnosis not present

## 2024-10-24 DIAGNOSIS — M7918 Myalgia, other site: Secondary | ICD-10-CM

## 2024-10-24 DIAGNOSIS — M533 Sacrococcygeal disorders, not elsewhere classified: Secondary | ICD-10-CM | POA: Diagnosis not present

## 2024-10-24 DIAGNOSIS — E611 Iron deficiency: Secondary | ICD-10-CM

## 2024-10-24 LAB — IBC + FERRITIN
Ferritin: 7.3 ng/mL — ABNORMAL LOW (ref 10.0–291.0)
Iron: 63 ug/dL (ref 42–145)
Saturation Ratios: 13 % — ABNORMAL LOW (ref 20.0–50.0)
TIBC: 483 ug/dL — ABNORMAL HIGH (ref 250.0–450.0)
Transferrin: 345 mg/dL (ref 212.0–360.0)

## 2024-10-24 LAB — CBC
HCT: 39.2 % (ref 36.0–46.0)
Hemoglobin: 12.7 g/dL (ref 12.0–15.0)
MCHC: 32.3 g/dL (ref 30.0–36.0)
MCV: 85.2 fl (ref 78.0–100.0)
Platelets: 254 K/uL (ref 150.0–400.0)
RBC: 4.6 Mil/uL (ref 3.87–5.11)
RDW: 16.1 % — ABNORMAL HIGH (ref 11.5–15.5)
WBC: 3.7 K/uL — ABNORMAL LOW (ref 4.0–10.5)

## 2024-10-24 LAB — VITAMIN D 25 HYDROXY (VIT D DEFICIENCY, FRACTURES): VITD: 37.03 ng/mL (ref 30.00–100.00)

## 2024-10-24 NOTE — Progress Notes (Signed)
 Patient says that she got about 80% relief for the first couple of weeks after her injection, and then gradually her pain began to return. She is still having left-sided low back pain, and denies any symptoms down the leg. She says that she will have sharp pain when moving from seated to standing. Her pain is worse in the evenings after being on her feet all day, and she uses a heating pad which gives her some relief. She is here today for repeat injection.

## 2024-10-24 NOTE — Progress Notes (Signed)
 Crystal Mcintyre - 48 y.o. female MRN [id number]  Date of birth: Nov 30, 1976  Office Visit Note: Visit Date: 10/24/2024 PCP: Charlett Apolinar POUR, MD Referred by: Charlett Apolinar POUR, MD  Subjective: Chief Complaint  Patient presents with   Lower Back - Pain   HPI: Crystal Mcintyre is a pleasant 48 y.o. female who presents today for follow-up of acute on chronic left-sided low back/SI joint pain.  Crystal Mcintyre has been having left-sided low back pain which has reoccurred.  She has been dealing with this over the course of this year but back in June we did provide ultrasound-guided left SI joint injection which gave her greater than 80% relief.  She was doing well with this and her home exercises, although here more recently she has been having recurrence of her left-sided low back pain.  The right side feels well.  She denies any numbness or tingling down the leg.  She is using a heating pad with some relief.  She is interested in proceeding with additional injection to see if this can further settle down her pain.  Pertinent ROS were reviewed with the patient and found to be negative unless otherwise specified above in HPI.   Assessment & Plan: Visit Diagnoses:  1. Chronic left SI joint pain   2. Pain in left buttock   3. Chronic bilateral low back pain without sciatica    Plan: Impression is acute on chronic low back pain with evidence of left-sided SI joint dysfunction.  She does have a prior MRI as well that showed multiple intervertebral lumbar disc bulges with a moderate size bulge at the L4-L5 level although no significant neural impingement or neural contact noted.  She did receive greater than 80% relief from previous SI joint injection back in June, through shared decision making we did proceed with repeat injection for the left SI joint today.  Advised on postinjection protocol, may use ice/heat as well as Tylenol  for any postinjection pain.  Avoid NSAIDs given her GERD with esophageal  spasming.  In the past, I did recommend if she did not receive significant relief or long-lasting relief, she may consider referral to Dr. Eldonna for an interlaminar L4-L5 injection, she will keep me updated on her improvement status postinjection.  She will rest with modified activity for the next 48 to 72 hours and then may resume her home rehab exercises on a consistent basis.  Follow-up: Return if symptoms worsen or fail to improve.   Meds & Orders: No orders of the defined types were placed in this encounter.   Orders Placed This Encounter  Procedures   US  Guided Needle Placement - No Linked Charges     Procedures: U/S-guided SI-joint injection, Left   After discussion of risk/benefits/indications, informed verbal consent was obtained. A timeout was then performed. The patient was positioned in a prone position on exam room table with a pillow placed under the pelvis for mild hip flexion. The SI joint area was cleaned and prepped with betadine and alcohol swabs. Sterile ultrasound gel was applied and the ultrasound transducer was placed in an anatomic axial plane over the PSIS, then moved distally over the SI-joint. Using ultrasound guidance, a 22-gauge, 3.5 needle was inserted from a medial to lateral approach utilizing an in-plane approach and directed into the SI-joint. The SI-joint was then injected with a mixture of 4:2 lidocaine :depomedrol with visualization of the injectate flow into the SI-joint under ultrasound visualization. The patient tolerated the procedure well without immediate complications.  Clinical History: No specialty comments available.  She reports that she quit smoking about 15 years ago. Her smoking use included cigarettes. She has never used smokeless tobacco. No results for input(s): HGBA1C, LABURIC in the last 8760 hours.  Objective:    Physical Exam  Gen: Well-appearing, in no acute distress; non-toxic CV: Well-perfused. Warm.  Resp: Breathing  unlabored on room air; no wheezing. Psych: Fluid speech in conversation; appropriate affect; normal thought process  Ortho Exam - Low back/SI joints: + TTP over the left SI joint region just inferior to the PSIS.  Positive Fortin's point test on the left, negative on the right.  Negative SLR bilaterally.  Imaging:  MR Lumbar Spine w/o contrast MR LUMBAR SPINE WITHOUT IV CONTRAST  COMPARISON: None available  CLINICAL HISTORY: Low back pain x20 years.  TECHNIQUE: SAG T2, SAG T1, SAG STIR, AX T2, AX T1 without IV contrast.  FINDINGS: There is mild levoscoliosis of the lumbar spine with multilevel disc desiccation most notably at L3-4, L4-5 and L5-S1. Mild facet arthrosis is present. There is no vertebral body height loss, subluxation or marrow replacing process. The sacrum and SI joints are unremarkable so far as visualized. Conus and cauda equina are unremarkable.  T12-L1: There is no focal disc protrusion, foraminal or spinal stenosis.  L1-2: There is no focal disc protrusion, foraminal or spinal stenosis.  L2-3: There is no focal disc protrusion, foraminal or spinal stenosis. Mild facet arthrosis.  L3-4: Mild disc desiccation and minimal broad-based bulge slightly effacing the ventral thecal sac. Mild facet arthrosis is present.  L4-5: Broad-based bulge effacing the ventral thecal sac resulting in crowding of the descending nerve roots in the lateral recess. There is caudal foraminal narrowing bilaterally. No definitive impingement of the exiting L4 nerves.  L5-S1: Mild disc desiccation and moderate facet arthrosis. There is moderate caudal foraminal narrowing on the left. Correlation for mild left L5 radicular symptoms.  Large uterine fibroids are partially imaged. Otherwise, the visualized retroperitoneal structures are unremarkable.  IMPRESSION: Mild degenerative spondylosis and slight levoscoliosis.  Mild disc desiccation with slight broad-based bulges at L3-4,  L4-5 and L5-S1. There is no significant spinal stenosis. Mild caudal foraminal narrowing at L4-5 and L5-S1. Correlation for mild radicular symptoms.  Large uterine fibroids are partially imaged.  Electronically signed by: Norleen Satchel MD 06/06/2024 12:19 PM EDT RP Workstation: [id number]  Past Medical/Family/Surgical/Social History: Medications & Allergies reviewed per EMR, new medications updated. Patient Active Problem List   Diagnosis Date Noted   Uterine leiomyoma 10/28/2021   Fibroids 10/28/2021   Hyperprolactinemia 09/14/2015   Left low back pain 01/04/2014   Hyperthyroidism 09/17/2013   GERD (gastroesophageal reflux disease) 08/26/2013   Female fertility problem 01/28/2012   Preventative health care 02/12/2011   Encounter for gynecological examination 02/12/2011   Insomnia 02/12/2011   OTHER ANXIETY STATES 01/30/2010   GOITER, MULTINODULAR 04/18/2009   TRANSAMINASES, SERUM, ELEVATED 01/10/2009   IRREGULAR MENSTRUATION 06/17/2007   Headache 06/17/2007   Past Medical History:  Diagnosis Date   Gastric ulcer    GERD (gastroesophageal reflux disease)    GOITER, MULTINODULAR 04/18/2009   Headache(784.0) 06/17/2007   IRREGULAR MENSTRUATION 06/17/2007   Migraines    Other anxiety states 01/30/2010   Scoliosis    THYROID  STIMULATING HORMONE, ABNORMAL 01/10/2009   TOBACCO USE 01/10/2008   TRANSAMINASES, SERUM, ELEVATED 01/10/2009   Family History  Problem Relation Age of Onset   Colon polyps Mother    Pneumonia Father    Alcohol abuse Father  Breast cancer Paternal Aunt    Breast cancer Paternal Aunt    Diabetes Maternal Grandfather    Prostate cancer Paternal Grandfather    Rectal cancer Neg Hx    Stomach cancer Neg Hx    Colon cancer Neg Hx    Esophageal cancer Neg Hx    Past Surgical History:  Procedure Laterality Date   CARPAL TUNNEL RELEASE Right 09/08/2022   Procedure: RIGHT CARPAL TUNNEL RELEASE;  Surgeon: Murrell Drivers, MD;  Location: [facility];  Service: Orthopedics;  Laterality: Right;  30 MIN   IR ANGIOGRAM PELVIS SELECTIVE OR SUPRASELECTIVE  10/28/2021   IR ANGIOGRAM PELVIS SELECTIVE OR SUPRASELECTIVE  10/28/2021   IR ANGIOGRAM SELECTIVE EACH ADDITIONAL VESSEL  10/28/2021   IR ANGIOGRAM SELECTIVE EACH ADDITIONAL VESSEL  10/28/2021   IR EMBO TUMOR ORGAN ISCHEMIA INFARCT INC GUIDE ROADMAPPING  10/28/2021   IR RADIOLOGIST EVAL & MGMT  07/24/2021   IR RADIOLOGIST EVAL & MGMT  11/28/2021   IR RADIOLOGIST EVAL & MGMT  05/22/2022   IR US  GUIDE VASC ACCESS LEFT  10/28/2021   IR US  GUIDE VASC ACCESS RIGHT  10/28/2021   MASS EXCISION Right 07/04/2021   Procedure: RIGHT THUMB EXCISION MUCOID CYST AND DEBRIDEMENT INTERPHALANGEAL JOINT ARTHRITIS;  Surgeon: Murrell Drivers, MD;  Location: [facility];  Service: Orthopedics;  Laterality: Right;   TONSILLECTOMY  as child   TYMPANOPLASTY  4 yrs ago   TYMPANOSTOMY TUBE PLACEMENT  as child   UPPER GASTROINTESTINAL ENDOSCOPY     Social History   Occupational History   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

## 2024-10-26 ENCOUNTER — Encounter (HOSPITAL_COMMUNITY): Payer: Self-pay | Admitting: Gastroenterology

## 2024-10-26 ENCOUNTER — Encounter (HOSPITAL_COMMUNITY)
Admission: RE | Disposition: A | Discharge status: Home / Self Care | Payer: Self-pay | Source: Home / Self Care | Attending: Gastroenterology

## 2024-10-26 ENCOUNTER — Ambulatory Visit (HOSPITAL_COMMUNITY)
Admission: RE | Admit: 2024-10-26 | Discharge: 2024-10-26 | Disposition: A | Discharge status: Home / Self Care | Attending: Gastroenterology | Admitting: Gastroenterology

## 2024-10-26 DIAGNOSIS — R1310 Dysphagia, unspecified: Secondary | ICD-10-CM

## 2024-10-26 DIAGNOSIS — K2289 Other specified disease of esophagus: Secondary | ICD-10-CM | POA: Diagnosis not present

## 2024-10-26 DIAGNOSIS — R0789 Other chest pain: Secondary | ICD-10-CM

## 2024-10-26 DIAGNOSIS — K219 Gastro-esophageal reflux disease without esophagitis: Secondary | ICD-10-CM

## 2024-10-26 DIAGNOSIS — R12 Heartburn: Secondary | ICD-10-CM

## 2024-10-26 HISTORY — PX: ESOPHAGEAL MANOMETRY: SHX5429

## 2024-10-26 HISTORY — PX: PH IMPEDANCE STUDY: SHX5565

## 2024-10-26 SURGERY — MANOMETRY, ESOPHAGUS

## 2024-10-26 MED ORDER — LIDOCAINE VISCOUS HCL 2 % MT SOLN
OROMUCOSAL | Status: AC
  Filled 2024-10-26: qty 15

## 2024-10-26 MED ORDER — LIDOCAINE VISCOUS HCL 2 % MT SOLN
OROMUCOSAL | Status: DC | PRN
  Administered 2024-10-26: 7 mL via OROMUCOSAL

## 2024-10-26 SURGICAL SUPPLY — 2 items
FACESHIELD LNG OPTICON STERILE (SAFETY) IMPLANT
GLOVE BIO SURGEON STRL SZ8 (GLOVE) ×2 IMPLANT

## 2024-10-26 NOTE — Progress Notes (Signed)
 Esophageal Manometry done per protocol. Pt tolerated well with out complication. Ph with impedance done per protocol. Pt tolerated well. Instructions given regarding the study and monitor. Pt verbalized understand and return demonstrated use of monitor. Pt will return tomorrow to have probe removed and monitor downloaded.

## 2024-10-31 ENCOUNTER — Encounter: Payer: Self-pay | Admitting: Radiology

## 2024-11-01 ENCOUNTER — Other Ambulatory Visit: Payer: Self-pay

## 2024-11-01 DIAGNOSIS — D509 Iron deficiency anemia, unspecified: Secondary | ICD-10-CM

## 2024-11-01 DIAGNOSIS — E611 Iron deficiency: Secondary | ICD-10-CM

## 2024-11-05 ENCOUNTER — Other Ambulatory Visit: Payer: Self-pay | Admitting: Internal Medicine

## 2024-11-07 ENCOUNTER — Telehealth: Payer: Self-pay | Admitting: Internal Medicine

## 2024-11-07 ENCOUNTER — Ambulatory Visit: Admitting: Gastroenterology

## 2024-11-07 DIAGNOSIS — D509 Iron deficiency anemia, unspecified: Secondary | ICD-10-CM

## 2024-11-07 DIAGNOSIS — K3189 Other diseases of stomach and duodenum: Secondary | ICD-10-CM

## 2024-11-07 NOTE — Progress Notes (Signed)
 SN: [id number] Exp: 2025-05-17 LOT: [id number] Patient arrived for Capsule Endoscopy. Reported the prep went well. This nurse explained dietary restrictions for the next few hours. Patient verbalized understanding. Opened capsule, ensured capsule was flashing prior to the patient swallowing the capsule. Patient swallowed capsule without difficulty. Patient instructed to return to the office at 4:00 pm today for removal of the recording equipment, to call the office with any questions and if no capsule was visualized after 72 hours. No further questions by the conclusion of the visit.

## 2024-11-07 NOTE — Patient Instructions (Signed)
POST CAPSULE INSTRUCTIONS:  Contact our office immediately at [phone] if you suffer from any abdominal pain, nausea, or vomiting during capsule endoscopy. Do not eat or drink for at least 2 hours. After 2 hours you may have any of the following to drink: Water   White grape juice 7-Up   Chicken Bouillon Sprite   Ginger Ale After 4 hours you may have a light snack to include any of the following: A cup of soup   sandwich Bowl of cereal  Rice Toast   Eggs 2-3 small cookies (i.e. vanilla wafers or graham crackers) After 8 hours you may return to your regular diet. During your procedure do not go near anyone else that is having capsule endoscopy. Do not be in close contact with an MRI machine or a radio or television tower. Do not wear a heavy coat or sweater because your recorder may over heat and stop recording.   Do not disconnect the equipment or remove the belt at any time.  Since the Data Recorder is actually a small computer, it should be treated with utmost care and protection.  Avoid sudden movement and banging of the Data Recorder.  Do not do any heavy lifting or strenuous physical activity during the test especially if it involves sweating and do not bend over or stoop during capsule endoscopy. During capsule endoscopy, you will need to verify every 15 minutes that the small light on top of the Data Recorder is blinking twice per second.  If for some reason it stops blinking at this site, record the time and contact our office at [phone].  

## 2024-11-07 NOTE — Telephone Encounter (Signed)
 Refill for pantoprazole  has been sent to patient's pharmacy per patient's request. FYI Dr. Albertus, patient wanted you to be aware she has finished all of the testing you have recommended and she is going for an iron infusion.

## 2024-11-07 NOTE — Telephone Encounter (Signed)
 Patient came in today to do the capsule endoscopy.  On her way out, she stopped by to request a refill of the pantoprazole .  She said she only has one pill left.  She uses the [facility] on [address].  She also wanted Dr. Albertus to know she's done all the testing he requested her to do and today's capsule is the last one. She is still waiting on the results of her last EGD.  And, she still has to go for an IV infusion of iron.  Thank you.

## 2024-11-28 NOTE — Progress Notes (Signed)
 Results from the Video Capsule Endoscopy reviewed and notable for the following:  Findings: 1) Complete capsule study with adequate prep 2) First duodenal image at 3 hours 5 minutes.  First ileocecal valve image at 4 hours 31 minutes.  Small bowel transit time approximately 1 hour 16 minutes 3) Single prepyloric erosion 4) Erosive changes in the proximal duodenum all located within 1 minute of the first duodenal image 4) remainder of the small bowel otherwise normal appearing without active bleeding or high-grade stigmata of bleeding in the mid or distal small bowel  Summary and Recommendations Complete capsule study with adequate views of the small bowel.  Mild erosive changes in the prepyloric stomach.  This is similar to the recent upper endoscopy which showed the same mildly erythematous mucosa in the prepyloric stomach.  There was mild erosive changes in the proximal small bowel also seen, but that area was better visualized on the recent upper endoscopy.  Otherwise, the remainder of the small bowel is otherwise normal-appearing and no active bleeding or high-grade stigmata of bleeding found on this study. - Hematology referral already placed for IV iron.  Keep appointment as scheduled  Sandor Flatter, DO, [facility]

## 2024-11-29 ENCOUNTER — Telehealth: Payer: Self-pay

## 2024-11-29 NOTE — Telephone Encounter (Signed)
-----   Message from Sandor LULLA Flatter sent at 11/28/2024  1:09 PM EST ----- Please contact this patient to let her know that I reviewed the results from the Video Capsule Endoscopy reviewed and notable for the following:  Findings: 1) Complete capsule study with adequate prep 2) First duodenal image at 3 hours 5 minutes.  First ileocecal valve image at 4 hours 31 minutes.  Small bowel transit time approximately 1 hour 16 minutes 3) Single prepyloric erosion 4) Erosive changes in the proximal duodenum all located within 1 minute of the first duodenal image 4) remainder of the small bowel otherwise normal appearing without active bleeding or high-grade stigmata of bleeding in the mid or distal small bowel  Summary and Recommendations Complete capsule study with adequate views of the small bowel.  Mild erosive changes in the prepyloric stomach.  This is similar to the recent upper endoscopy which showed the same mildly erythematous mucosa in the prepyloric stomach.  There was mild erosive changes in the proximal small bowel also seen, but that area was better visualized on the recent upper endoscopy.  Otherwise, the remainder of the small bowel is otherwise normal-appearing and no active bleeding or high-grade stigmata of bleeding found on this study. - Hematology referral already placed for IV iron.  Keep appointment as scheduled  Thanks

## 2024-11-29 NOTE — Telephone Encounter (Signed)
 Patient informed of results of capsule endoscopy per Dr San.  Patient advised to keep appointment with Hematology as planned for 12-07-24 to discuss IV iron.  Patient agreed to plan and verbalized understanding.  No further questions.

## 2024-12-06 ENCOUNTER — Telehealth: Payer: Self-pay

## 2024-12-06 NOTE — Telephone Encounter (Signed)
 Spoke with patient and confirmed appointment on 12/10

## 2024-12-07 ENCOUNTER — Inpatient Hospital Stay: Attending: Hematology and Oncology | Admitting: Hematology and Oncology

## 2024-12-07 ENCOUNTER — Inpatient Hospital Stay

## 2024-12-07 VITALS — BP 127/67 | HR 86 | Temp 97.3°F | Resp 17 | Wt 180.8 lb

## 2024-12-07 DIAGNOSIS — Z87891 Personal history of nicotine dependence: Secondary | ICD-10-CM | POA: Insufficient documentation

## 2024-12-07 DIAGNOSIS — D509 Iron deficiency anemia, unspecified: Secondary | ICD-10-CM | POA: Insufficient documentation

## 2024-12-07 MED ORDER — FERROUS SULFATE 325 (65 FE) MG PO TBEC: 325.0000 mg | DELAYED_RELEASE_TABLET | Freq: Every day | ORAL | 5 refills | Status: AC

## 2024-12-07 NOTE — Progress Notes (Signed)
 [facility] CONSULT NOTE  Patient Care Team: Panosh, Apolinar POUR, MD as PCP - General Kassie Mallick, MD (Inactive) (Endocrinology) Malcom Brightly, MD (Neurology) Rutherford Gain, MD as Consulting Physician (Obstetrics and Gynecology)  CHIEF COMPLAINTS/PURPOSE OF CONSULTATION:  IDA  ASSESSMENT & PLAN:   Assessment and Plan Assessment & Plan Iron deficiency anemia Iron deficiency anemia improved with oral iron; hemoglobin normalized but iron stores still depleted. No ongoing blood loss or gastrointestinal bleeding. Oral iron preferred due to tolerance and absence of severe anemia. - Prescribed daily oral iron for 4-5 months to replenish iron stores. - Explained oral iron as preferred therapy; iron infusion reserved for intolerance or severe anemia. - Advised to monitor for pica as a sign of recurrent iron deficiency. - Cancelled today's lab appointment; no new intervention since last labs. - Planned repeat blood work at follow-up in 4-5 months to reassess iron status and hemoglobin. - Instructed to return for follow-up in 4-5 months.  Menopausal symptoms Postmenopausal with vasomotor symptoms, insomnia, and hair changes managed with Bijuva, resulting in symptomatic relief. Mild hot flashes persist.  - Reviewed ongoing use of Bijuva;  - Reinforced importance of regular mammograms and self-breast exams given family history of breast cancer.    HISTORY OF PRESENTING ILLNESS:  Crystal Mcintyre 48 y.o. female is here because of IDA  Discussed the use of AI scribe software for clinical note transcription with the patient, who gave verbal consent to proceed.  History of Present Illness Crystal Mcintyre is a 48 year old female with iron deficiency anemia who presents for evaluation of persistent iron deficiency following prior oral iron supplementation.  She underwent a gastrointestinal workup for hiatal hernia and GERD, during which she was told she was iron deficient.  She completed a 6-8 week course of oral iron, resulting in improvement of hemoglobin from 11.4 g/dL in July to 87.2 g/dL, but is not currently taking iron supplements. She has been postmenopausal for approximately two years and has not used iron since her last laboratory assessment on October 24, 2024.  She does not recall specific symptoms attributable to iron deficiency at the time of diagnosis, as her initial presentation was for gastrointestinal complaints. Approximately two years ago, she experienced significant pica with daily ice consumption, which has since resolved. She notes intermittent lightheadedness, particularly on standing, but attributes this to her active work schedule. She denies shortness of breath, abnormal bleeding, ongoing gastrointestinal blood loss, hematochezia, or melena except when taking iron pills. She denies brittle nails but endorses brittle hair and hair loss. Family members previously noted her skin appeared a little yellow about a year ago, but she denies jaundice.  She has a history of endometriosis and infertility, with prior unsuccessful IUI and IVF attempts. She entered perimenopause at age [age] and has experienced significant menopausal symptoms, including hot flashes, night sweats, insomnia, and facial flushing. Six months ago, she initiated Bijuva (hormone replacement therapy), which has provided substantial improvement in her symptoms, though she continues to have occasional hot flashes. She takes magnesium to aid sleep. She is aware of her family history of breast cancer and remains up to date with mammograms and self-breast exams.  All other systems were reviewed with the patient and are negative.  MEDICAL HISTORY:  Past Medical History:  Diagnosis Date   Gastric ulcer    GERD (gastroesophageal reflux disease)    GOITER, MULTINODULAR 04/18/2009   Headache(784.0) 06/17/2007   IRREGULAR MENSTRUATION 06/17/2007   Migraines    Other anxiety states  01/30/2010   Scoliosis    THYROID  STIMULATING HORMONE, ABNORMAL 01/10/2009   TOBACCO USE 01/10/2008   TRANSAMINASES, SERUM, ELEVATED 01/10/2009    SURGICAL HISTORY: Past Surgical History:  Procedure Laterality Date   CARPAL TUNNEL RELEASE Right 09/08/2022   Procedure: RIGHT CARPAL TUNNEL RELEASE;  Surgeon: Murrell Drivers, MD;  Location: [facility];  Service: Orthopedics;  Laterality: Right;  30 MIN   ESOPHAGEAL MANOMETRY N/A 10/26/2024   Procedure: MANOMETRY, ESOPHAGUS;  Surgeon: San Sandor GAILS, DO;  Location: [facility];  Service: Gastroenterology;  Laterality: N/A;   IR ANGIOGRAM PELVIS SELECTIVE OR SUPRASELECTIVE  10/28/2021   IR ANGIOGRAM PELVIS SELECTIVE OR SUPRASELECTIVE  10/28/2021   IR ANGIOGRAM SELECTIVE EACH ADDITIONAL VESSEL  10/28/2021   IR ANGIOGRAM SELECTIVE EACH ADDITIONAL VESSEL  10/28/2021   IR EMBO TUMOR ORGAN ISCHEMIA INFARCT INC GUIDE ROADMAPPING  10/28/2021   IR RADIOLOGIST EVAL & MGMT  07/24/2021   IR RADIOLOGIST EVAL & MGMT  11/28/2021   IR RADIOLOGIST EVAL & MGMT  05/22/2022   IR US  GUIDE VASC ACCESS LEFT  10/28/2021   IR US  GUIDE VASC ACCESS RIGHT  10/28/2021   MASS EXCISION Right 07/04/2021   Procedure: RIGHT THUMB EXCISION MUCOID CYST AND DEBRIDEMENT INTERPHALANGEAL JOINT ARTHRITIS;  Surgeon: Murrell Drivers, MD;  Location: [facility];  Service: Orthopedics;  Laterality: Right;   PH IMPEDANCE STUDY N/A 10/26/2024   Procedure: IMPEDANCE PH STUDY, ESOPHAGUS;  Surgeon: San Sandor GAILS, DO;  Location: [facility];  Service: Gastroenterology;  Laterality: N/A;   TONSILLECTOMY  as child   TYMPANOPLASTY  4 yrs ago   TYMPANOSTOMY TUBE PLACEMENT  as child   UPPER GASTROINTESTINAL ENDOSCOPY      SOCIAL HISTORY: Social History   Socioeconomic History   Marital status: Legally Separated    Spouse name: Not on file   Number of children: Not on file   Years of education: Not on file   Highest education level: Not on file   Occupational History   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      Social Drivers of Corporate Investment Banker Strain: Not on file  Food Insecurity: Not on file  Transportation Needs: Not on file  Physical Activity: Not on file  Stress: Not on file  Social Connections: Not on file  Intimate Partner Violence: Not on file    FAMILY HISTORY: Family History  Problem Relation Age of Onset   Colon polyps Mother    Pneumonia Father    Alcohol abuse Father    Breast cancer Paternal Aunt    Breast cancer Paternal Aunt    Diabetes Maternal Grandfather    Prostate  cancer Paternal Grandfather    Rectal cancer Neg Hx    Stomach cancer Neg Hx    Colon cancer Neg Hx    Esophageal cancer Neg Hx     ALLERGIES:  is allergic to excedrin extra strength [asa-apap-caff buffered].  MEDICATIONS:  Current Outpatient Medications  Medication Sig Dispense Refill   Estradiol-Progesterone (BIJUVA) 1-100 MG CAPS      ferrous sulfate  325 (65 FE) MG EC tablet Take 1 tablet (325 mg total) by mouth 2 (two) times daily.     Multiple Vitamin (MULTI VITAMIN DAILY PO) Take 2 tablets by mouth daily.     pantoprazole  (PROTONIX ) 40 MG tablet TAKE 1 TABLET BY MOUTH TWICE A DAY 180 tablet 3   Vitamin D , Ergocalciferol , (DRISDOL ) 1.25 MG (50000 UNIT)  CAPS capsule Take 1 capsule (50,000 Units total) by mouth every 7 (seven) days. For 8 weeks 8 capsule 0   No current facility-administered medications for this visit.     PHYSICAL EXAMINATION: ECOG PERFORMANCE STATUS: 0 - Asymptomatic  Vitals:   12/07/24 1505  BP: 127/67  Pulse: 86  Resp: 17  Temp: (!) 97.3 F (36.3 C)  SpO2: 99%   Filed Weights   12/07/24 1505  Weight: 180 lb 12.8 oz (82 kg)    GENERAL:alert, no distress and comfortable SKIN: skin color, texture, turgor are normal, no rashes or significant lesions EYES: normal, conjunctiva are pink and non-injected, sclera clear OROPHARYNX:no exudate, no erythema and lips, buccal mucosa, and tongue normal  NECK: supple, thyroid  normal size, non-tender, without nodularity LYMPH:  no palpable lymphadenopathy in the cervical, axillary LUNGS: clear to auscultation and percussion with normal breathing effort HEART: regular rate & rhythm and no murmurs and no lower extremity edema ABDOMEN:abdomen soft, non-tender and normal bowel sounds Musculoskeletal:no cyanosis of digits and no clubbing  PSYCH: alert & oriented x 3 with fluent speech NEURO: no focal motor/sensory deficits  LABORATORY DATA:  I have reviewed the data as listed Lab Results  Component Value Date   WBC 3.7 (L) 10/24/2024   HGB 12.7 10/24/2024   HCT 39.2 10/24/2024   MCV 85.2 10/24/2024   PLT 254.0 10/24/2024     Chemistry      Component Value Date/Time   NA 138 07/27/2024 1555   K 4.1 07/27/2024 1555   CL 102 07/27/2024 1555   CO2 31 07/27/2024 1555   BUN 10 07/27/2024 1555   CREATININE 0.70 07/27/2024 1555   CREATININE 0.81 08/02/2020 0955      Component Value Date/Time   CALCIUM 9.7 07/27/2024 1555   ALKPHOS 91 07/27/2024 1555   AST 16 07/27/2024 1555   ALT 10 07/27/2024 1555   BILITOT 0.3 07/27/2024 1555       RADIOGRAPHIC STUDIES: I have personally reviewed the radiological images as listed and agreed with the findings in the  report. No results found.  All questions were answered. The patient knows to call the clinic with any problems, questions or concerns. I spent 45 minutes in the care of this patient including H and P, review of records, counseling and coordination of care.     Amber Stalls, MD 12/07/2024 3:07 PM

## 2024-12-12 ENCOUNTER — Ambulatory Visit: Payer: Self-pay | Admitting: Gastroenterology

## 2024-12-13 ENCOUNTER — Encounter: Payer: Self-pay | Admitting: Gastroenterology

## 2024-12-13 MED ORDER — VENLAFAXINE HCL ER 37.5 MG PO CP24: 37.5000 mg | ORAL_CAPSULE | Freq: Every day | ORAL | 3 refills | Status: AC

## 2024-12-13 NOTE — Telephone Encounter (Signed)
 Spoke with patient to discuss results. Patient voiced understanding. Patient agreeable to start Venlafaxine . Patient counseled to start with 37.5 mg nightly.  If results are sub optimal,  may increase to 75 mg nightly. Asked patient to keep us  updated on her response to medication and to make f/u with Dr Albertus in a few months. Patient agreeable to all.

## 2024-12-13 NOTE — Telephone Encounter (Signed)
 Patient confirmed she was off Pantoprazole  one week prior to Esophageal manometry .

## 2024-12-28 DIAGNOSIS — R12 Heartburn: Secondary | ICD-10-CM

## 2024-12-28 DIAGNOSIS — R0789 Other chest pain: Secondary | ICD-10-CM

## 2024-12-28 DIAGNOSIS — R1310 Dysphagia, unspecified: Secondary | ICD-10-CM

## 2025-04-07 ENCOUNTER — Inpatient Hospital Stay: Admitting: Hematology and Oncology

## 2025-04-07 ENCOUNTER — Inpatient Hospital Stay
